# Patient Record
Sex: Female | Born: 1940
Health system: Southern US, Community
[De-identification: ages and names within clinical notes are randomized; demographics above are authoritative.]

## PROBLEM LIST (undated history)

## (undated) ENCOUNTER — Emergency Department (HOSPITAL_COMMUNITY): Admission: EM | Discharge status: Against Medical Advice | Payer: Medicare Other

## (undated) DIAGNOSIS — K219 Gastro-esophageal reflux disease without esophagitis: Secondary | ICD-10-CM

## (undated) DIAGNOSIS — C801 Malignant (primary) neoplasm, unspecified: Secondary | ICD-10-CM

## (undated) DIAGNOSIS — T7840XA Allergy, unspecified, initial encounter: Secondary | ICD-10-CM

## (undated) DIAGNOSIS — H269 Unspecified cataract: Secondary | ICD-10-CM

## (undated) DIAGNOSIS — I10 Essential (primary) hypertension: Secondary | ICD-10-CM

## (undated) DIAGNOSIS — E785 Hyperlipidemia, unspecified: Secondary | ICD-10-CM

## (undated) DIAGNOSIS — R059 Cough, unspecified: Secondary | ICD-10-CM

## (undated) DIAGNOSIS — E079 Disorder of thyroid, unspecified: Secondary | ICD-10-CM

## (undated) DIAGNOSIS — M719 Bursopathy, unspecified: Secondary | ICD-10-CM

## (undated) DIAGNOSIS — H409 Unspecified glaucoma: Secondary | ICD-10-CM

## (undated) DIAGNOSIS — Z86010 Personal history of colonic polyps: Secondary | ICD-10-CM

## (undated) DIAGNOSIS — M1990 Unspecified osteoarthritis, unspecified site: Secondary | ICD-10-CM

## (undated) DIAGNOSIS — G2581 Restless legs syndrome: Secondary | ICD-10-CM

## (undated) DIAGNOSIS — E7800 Pure hypercholesterolemia, unspecified: Secondary | ICD-10-CM

## (undated) HISTORY — PX: EYE SURGERY: SHX253

## (undated) HISTORY — DX: Unspecified osteoarthritis, unspecified site: M19.90

## (undated) HISTORY — PX: TUBAL LIGATION: SHX77

## (undated) HISTORY — DX: Malignant (primary) neoplasm, unspecified: C80.1

## (undated) HISTORY — PX: SKIN CANCER EXCISION: SHX779

## (undated) HISTORY — DX: Essential (primary) hypertension: I10

## (undated) HISTORY — DX: Unspecified glaucoma: H40.9

## (undated) HISTORY — PX: COLONOSCOPY: SHX174

## (undated) HISTORY — PX: TONSILLECTOMY AND ADENOIDECTOMY: SUR1326

## (undated) HISTORY — PX: CHOLECYSTECTOMY: SHX55

## (undated) HISTORY — PX: OTHER SURGICAL HISTORY: SHX169

## (undated) HISTORY — DX: Allergy, unspecified, initial encounter: T78.40XA

## (undated) HISTORY — DX: Personal history of colonic polyps: Z86.010

## (undated) HISTORY — DX: Gastro-esophageal reflux disease without esophagitis: K21.9

## (undated) HISTORY — DX: Hyperlipidemia, unspecified: E78.5

## (undated) HISTORY — DX: Unspecified cataract: H26.9

## (undated) HISTORY — DX: Disorder of thyroid, unspecified: E07.9

---

## 1997-12-31 ENCOUNTER — Other Ambulatory Visit: Admission: RE | Admit: 1997-12-31 | Discharge: 1997-12-31 | Payer: Self-pay

## 1999-08-06 ENCOUNTER — Other Ambulatory Visit: Admission: RE | Admit: 1999-08-06 | Discharge: 1999-08-06 | Payer: Self-pay | Admitting: Internal Medicine

## 1999-09-25 ENCOUNTER — Encounter: Payer: Self-pay | Admitting: Internal Medicine

## 1999-09-25 ENCOUNTER — Ambulatory Visit (HOSPITAL_COMMUNITY): Admission: RE | Admit: 1999-09-25 | Discharge: 1999-09-25 | Payer: Self-pay | Admitting: Internal Medicine

## 2000-12-16 ENCOUNTER — Ambulatory Visit (HOSPITAL_COMMUNITY): Admission: RE | Admit: 2000-12-16 | Discharge: 2000-12-16 | Payer: Self-pay | Admitting: Internal Medicine

## 2000-12-16 ENCOUNTER — Encounter: Payer: Self-pay | Admitting: Internal Medicine

## 2001-01-10 ENCOUNTER — Encounter (HOSPITAL_BASED_OUTPATIENT_CLINIC_OR_DEPARTMENT_OTHER): Payer: Self-pay | Admitting: General Surgery

## 2001-01-10 ENCOUNTER — Encounter: Admission: RE | Admit: 2001-01-10 | Discharge: 2001-01-10 | Payer: Self-pay | Admitting: General Surgery

## 2001-01-11 ENCOUNTER — Encounter (INDEPENDENT_AMBULATORY_CARE_PROVIDER_SITE_OTHER): Payer: Self-pay | Admitting: *Deleted

## 2001-01-11 ENCOUNTER — Ambulatory Visit (HOSPITAL_BASED_OUTPATIENT_CLINIC_OR_DEPARTMENT_OTHER): Admission: RE | Admit: 2001-01-11 | Discharge: 2001-01-11 | Payer: Self-pay | Admitting: General Surgery

## 2001-05-26 ENCOUNTER — Other Ambulatory Visit: Admission: RE | Admit: 2001-05-26 | Discharge: 2001-05-26 | Payer: Self-pay | Admitting: Internal Medicine

## 2002-10-13 ENCOUNTER — Other Ambulatory Visit: Admission: RE | Admit: 2002-10-13 | Discharge: 2002-10-13 | Payer: Self-pay | Admitting: Internal Medicine

## 2002-10-16 ENCOUNTER — Encounter: Admission: RE | Admit: 2002-10-16 | Discharge: 2002-10-16 | Payer: Self-pay | Admitting: Internal Medicine

## 2002-10-16 ENCOUNTER — Encounter: Payer: Self-pay | Admitting: Internal Medicine

## 2004-06-11 ENCOUNTER — Encounter: Payer: Self-pay | Admitting: Internal Medicine

## 2004-06-16 ENCOUNTER — Encounter: Admission: RE | Admit: 2004-06-16 | Discharge: 2004-06-16 | Payer: Self-pay | Admitting: Internal Medicine

## 2004-07-09 ENCOUNTER — Ambulatory Visit: Payer: Self-pay | Admitting: Pulmonary Disease

## 2004-08-06 ENCOUNTER — Ambulatory Visit: Payer: Self-pay | Admitting: Internal Medicine

## 2004-10-25 ENCOUNTER — Emergency Department (HOSPITAL_COMMUNITY): Admission: EM | Admit: 2004-10-25 | Discharge: 2004-10-25 | Payer: Self-pay | Admitting: Family Medicine

## 2004-10-25 ENCOUNTER — Ambulatory Visit (HOSPITAL_COMMUNITY): Admission: RE | Admit: 2004-10-25 | Discharge: 2004-10-25 | Payer: Self-pay | Admitting: Family Medicine

## 2004-11-06 ENCOUNTER — Ambulatory Visit: Payer: Self-pay | Admitting: Internal Medicine

## 2005-03-11 ENCOUNTER — Ambulatory Visit: Payer: Self-pay | Admitting: Internal Medicine

## 2005-04-14 ENCOUNTER — Ambulatory Visit: Payer: Self-pay | Admitting: Internal Medicine

## 2005-04-22 ENCOUNTER — Ambulatory Visit: Payer: Self-pay | Admitting: Internal Medicine

## 2005-05-21 ENCOUNTER — Ambulatory Visit: Payer: Self-pay | Admitting: Internal Medicine

## 2006-01-20 ENCOUNTER — Ambulatory Visit: Payer: Self-pay | Admitting: Internal Medicine

## 2006-03-02 ENCOUNTER — Ambulatory Visit: Payer: Self-pay | Admitting: Internal Medicine

## 2006-07-19 ENCOUNTER — Ambulatory Visit: Payer: Self-pay | Admitting: Internal Medicine

## 2007-03-01 LAB — CONVERTED CEMR LAB: Pap Smear: NORMAL

## 2007-03-29 DIAGNOSIS — I10 Essential (primary) hypertension: Secondary | ICD-10-CM | POA: Insufficient documentation

## 2007-03-29 DIAGNOSIS — F17200 Nicotine dependence, unspecified, uncomplicated: Secondary | ICD-10-CM | POA: Insufficient documentation

## 2007-03-29 DIAGNOSIS — E118 Type 2 diabetes mellitus with unspecified complications: Secondary | ICD-10-CM | POA: Insufficient documentation

## 2007-04-14 ENCOUNTER — Ambulatory Visit: Payer: Self-pay | Admitting: Internal Medicine

## 2007-04-14 DIAGNOSIS — E1169 Type 2 diabetes mellitus with other specified complication: Secondary | ICD-10-CM | POA: Insufficient documentation

## 2007-04-14 DIAGNOSIS — E785 Hyperlipidemia, unspecified: Secondary | ICD-10-CM

## 2007-04-14 LAB — CONVERTED CEMR LAB
Cholesterol, target level: 200 mg/dL
HDL goal, serum: 40 mg/dL
LDL Goal: 100 mg/dL

## 2007-04-15 LAB — CONVERTED CEMR LAB
ALT: 29 units/L (ref 0–35)
AST: 27 units/L (ref 0–37)
Albumin: 4.6 g/dL (ref 3.5–5.2)
Alkaline Phosphatase: 94 units/L (ref 39–117)
BUN: 14 mg/dL (ref 6–23)
Bilirubin, Direct: 0.1 mg/dL (ref 0.0–0.3)
CO2: 31 meq/L (ref 19–32)
Calcium: 9.8 mg/dL (ref 8.4–10.5)
Chloride: 104 meq/L (ref 96–112)
Cholesterol: 138 mg/dL (ref 0–200)
Creatinine, Ser: 0.5 mg/dL (ref 0.4–1.2)
GFR calc Af Amer: 160 mL/min
GFR calc non Af Amer: 132 mL/min
Glucose, Bld: 119 mg/dL — ABNORMAL HIGH (ref 70–99)
HDL: 45 mg/dL (ref >39.0)
Hgb A1c MFr Bld: 6.5 % — ABNORMAL HIGH (ref 4.6–6.0)
LDL Cholesterol: 63 mg/dL (ref 0–99)
Potassium: 4.5 meq/L (ref 3.5–5.1)
Sodium: 143 meq/L (ref 135–145)
Total Bilirubin: 1 mg/dL (ref 0.3–1.2)
Total CHOL/HDL Ratio: 3.1
Total Protein: 7.4 g/dL (ref 6.0–8.3)
Triglycerides: 152 mg/dL — ABNORMAL HIGH (ref 0–149)
VLDL: 30 mg/dL (ref 0–40)

## 2007-08-03 ENCOUNTER — Ambulatory Visit (HOSPITAL_COMMUNITY): Admission: RE | Admit: 2007-08-03 | Discharge: 2007-08-03 | Payer: Self-pay | Admitting: Internal Medicine

## 2007-09-19 ENCOUNTER — Telehealth: Payer: Self-pay | Admitting: Internal Medicine

## 2008-05-14 ENCOUNTER — Ambulatory Visit: Payer: Self-pay | Admitting: Internal Medicine

## 2008-05-14 DIAGNOSIS — I8390 Asymptomatic varicose veins of unspecified lower extremity: Secondary | ICD-10-CM | POA: Insufficient documentation

## 2008-05-15 LAB — CONVERTED CEMR LAB
ALT: 23 units/L (ref 0–35)
Basophils Absolute: 0.1 10*3/uL (ref 0.0–0.1)
Basophils Relative: 1.3 % (ref 0.0–3.0)
Bilirubin, Direct: 0.1 mg/dL (ref 0.0–0.3)
Calcium: 9.2 mg/dL (ref 8.4–10.5)
Chloride: 112 meq/L (ref 96–112)
Cholesterol: 102 mg/dL (ref 0–200)
Creatinine, Ser: 0.5 mg/dL (ref 0.4–1.2)
Eosinophils Relative: 3 % (ref 0.0–5.0)
GFR calc Af Amer: 159 mL/min
Glucose, Bld: 115 mg/dL — ABNORMAL HIGH (ref 70–99)
HDL: 32 mg/dL — ABNORMAL LOW (ref >39.0)
Lymphocytes Relative: 31.1 % (ref 12.0–46.0)
MCHC: 34.9 g/dL (ref 30.0–36.0)
Monocytes Relative: 5.5 % (ref 3.0–12.0)
Neutro Abs: 4.3 10*3/uL (ref 1.4–7.7)
Neutrophils Relative %: 59.1 % (ref 43.0–77.0)
Platelets: 215 10*3/uL (ref 150–400)
RBC: 4.36 M/uL (ref 3.87–5.11)
RDW: 12 % (ref 11.5–14.6)
Total Protein: 6.7 g/dL (ref 6.0–8.3)
VLDL: 24 mg/dL (ref 0–40)

## 2008-05-29 ENCOUNTER — Ambulatory Visit: Payer: Self-pay | Admitting: Internal Medicine

## 2008-06-01 ENCOUNTER — Ambulatory Visit: Payer: Self-pay | Admitting: Internal Medicine

## 2008-06-05 ENCOUNTER — Encounter: Admission: RE | Admit: 2008-06-05 | Discharge: 2008-06-05 | Payer: Self-pay | Admitting: Internal Medicine

## 2008-06-05 ENCOUNTER — Encounter: Payer: Self-pay | Admitting: Internal Medicine

## 2008-06-12 ENCOUNTER — Ambulatory Visit: Payer: Self-pay | Admitting: Internal Medicine

## 2008-06-12 ENCOUNTER — Encounter: Payer: Self-pay | Admitting: Internal Medicine

## 2008-06-12 DIAGNOSIS — Z860100 Personal history of colon polyps, unspecified: Secondary | ICD-10-CM

## 2008-06-12 HISTORY — DX: Personal history of colonic polyps: Z86.010

## 2008-06-12 HISTORY — DX: Personal history of colon polyps, unspecified: Z86.0100

## 2008-06-12 LAB — HM COLONOSCOPY

## 2008-06-14 ENCOUNTER — Ambulatory Visit: Payer: Self-pay | Admitting: Internal Medicine

## 2008-06-18 ENCOUNTER — Telehealth: Payer: Self-pay | Admitting: Internal Medicine

## 2008-06-20 ENCOUNTER — Encounter: Payer: Self-pay | Admitting: Internal Medicine

## 2008-10-09 ENCOUNTER — Ambulatory Visit: Payer: Self-pay | Admitting: Internal Medicine

## 2008-10-16 ENCOUNTER — Encounter: Admission: RE | Admit: 2008-10-16 | Discharge: 2008-10-16 | Payer: Self-pay | Admitting: Interventional Radiology

## 2008-12-04 ENCOUNTER — Encounter: Admission: RE | Admit: 2008-12-04 | Discharge: 2008-12-04 | Payer: Self-pay | Admitting: Interventional Radiology

## 2008-12-11 ENCOUNTER — Encounter: Admission: RE | Admit: 2008-12-11 | Discharge: 2008-12-11 | Payer: Self-pay | Admitting: Interventional Radiology

## 2009-01-01 ENCOUNTER — Encounter: Admission: RE | Admit: 2009-01-01 | Discharge: 2009-01-01 | Payer: Self-pay | Admitting: Interventional Radiology

## 2009-01-18 ENCOUNTER — Telehealth: Payer: Self-pay | Admitting: Internal Medicine

## 2009-01-18 ENCOUNTER — Ambulatory Visit: Payer: Self-pay | Admitting: Internal Medicine

## 2009-01-18 LAB — CONVERTED CEMR LAB
Bilirubin Urine: NEGATIVE
Glucose, Urine, Semiquant: NEGATIVE
Ketones, urine, test strip: NEGATIVE
Nitrite: NEGATIVE

## 2009-01-19 ENCOUNTER — Encounter: Payer: Self-pay | Admitting: Internal Medicine

## 2009-06-05 ENCOUNTER — Ambulatory Visit: Payer: Self-pay | Admitting: Internal Medicine

## 2009-06-05 LAB — CONVERTED CEMR LAB
ALT: 27 units/L (ref 0–35)
AST: 26 units/L (ref 0–37)
Albumin: 4.3 g/dL (ref 3.5–5.2)
Alkaline Phosphatase: 68 units/L (ref 39–117)
Basophils Absolute: 0 10*3/uL (ref 0.0–0.1)
Basophils Relative: 0.1 % (ref 0.0–3.0)
CO2: 23 meq/L (ref 19–32)
Calcium: 9.3 mg/dL (ref 8.4–10.5)
Chloride: 103 meq/L (ref 96–112)
Cholesterol: 112 mg/dL (ref 0–200)
Creatinine, Ser: 0.5 mg/dL (ref 0.4–1.2)
Eosinophils Relative: 2 % (ref 0.0–5.0)
GFR calc non Af Amer: 131.16 mL/min (ref >60)
Glucose, Bld: 114 mg/dL — ABNORMAL HIGH (ref 70–99)
HCT: 45.5 % (ref 36.0–46.0)
LDL Cholesterol: 46 mg/dL (ref 0–99)
MCHC: 34 g/dL (ref 30.0–36.0)
RBC: 4.6 M/uL (ref 3.87–5.11)
RDW: 12.4 % (ref 11.5–14.6)
TSH: 1.22 microintl units/mL (ref 0.35–5.50)
Total Bilirubin: 0.7 mg/dL (ref 0.3–1.2)
Total Protein: 7 g/dL (ref 6.0–8.3)
VLDL: 27 mg/dL (ref 0.0–40.0)

## 2009-06-07 ENCOUNTER — Ambulatory Visit: Payer: Self-pay | Admitting: Internal Medicine

## 2009-06-08 ENCOUNTER — Encounter: Payer: Self-pay | Admitting: Internal Medicine

## 2009-06-10 ENCOUNTER — Telehealth: Payer: Self-pay | Admitting: Internal Medicine

## 2009-06-25 ENCOUNTER — Encounter: Admission: RE | Admit: 2009-06-25 | Discharge: 2009-06-25 | Payer: Self-pay | Admitting: Interventional Radiology

## 2010-04-04 ENCOUNTER — Ambulatory Visit: Payer: Self-pay | Admitting: Family Medicine

## 2010-05-31 LAB — CONVERTED CEMR LAB: Pap Smear: NORMAL

## 2010-06-06 ENCOUNTER — Ambulatory Visit: Payer: Self-pay | Admitting: Internal Medicine

## 2010-06-09 LAB — CONVERTED CEMR LAB
ALT: 28 units/L (ref 0–35)
Albumin: 4.2 g/dL (ref 3.5–5.2)
BUN: 14 mg/dL (ref 6–23)
CO2: 31 meq/L (ref 19–32)
Creatinine, Ser: 0.5 mg/dL (ref 0.4–1.2)
GFR calc non Af Amer: 143.96 mL/min (ref >60)
Glucose, Bld: 125 mg/dL — ABNORMAL HIGH (ref 70–99)
TSH: 0.89 microintl units/mL (ref 0.35–5.50)
Total CHOL/HDL Ratio: 3
Total Protein: 6.7 g/dL (ref 6.0–8.3)
Triglycerides: 109 mg/dL (ref 0.0–149.0)

## 2010-06-11 ENCOUNTER — Encounter: Payer: Self-pay | Admitting: Family Medicine

## 2010-09-30 NOTE — Miscellaneous (Signed)
Summary: BMI   Clinical Lists Changes  Observations: Added new observation of BMI: 32.49  (06/11/2010 13:50) Added new observation of TEMPERATURE: 98 deg F (06/11/2010 13:50) Added new observation of WEIGHT: 177 lb (06/11/2010 13:50)      Vital Signs:  Patient Profile:   70 Years Old Female Height:     62 inches (156.21 cm) Weight:      177 pounds BMI:     32.49 Temp:     98 degrees F

## 2010-09-30 NOTE — Miscellaneous (Signed)
Summary: Waiver of Liability for [employer]  Waiver of Liability for [employer]   Imported By: Maryln Gottron 06/11/2010 11:00:49  _____________________________________________________________________  External Attachment:    Type:   Image     Comment:   External Document

## 2010-09-30 NOTE — Assessment & Plan Note (Signed)
Summary: emp---will fast//ccm   Vital Signs:  Patient profile:   70 year old female Menstrual status:  postmenopausal Height:      62 inches Weight:      177 pounds Temp:     98.0 degrees F oral Pulse rate:   72 / minute Pulse rhythm:   regular Resp:     12 per minute BP sitting:   168 / 84  (left arm) Cuff size:   regular  Vitals Entered By: Sid Falcon LPN (June 06, 2010 9:06 AM)  History of Present Illness: Here for Medicare AWV:  1.   Risk factors based on Past M, S, F history:---see PMH 2.   Physical Activities: --able to do all, no physical exercise 3.   Depression/mood: ---doing well, no concerns 4.   Hearing: -no concerns 5.   ADL's: ---able to do all 6.   Fall Risk: --no concerns 7.   Home Safety: --no concerns 8.   Height, weight, &visual acuity:see exam, yearly optometry exam 9.   Counseling: ---diet, exercise and smoking cessation 10.   Labs ordered based on risk factors: -see orders 11.           Referral Coordination---none necessary 12.           Care Plan---stop smoking 13.            Cognitive Assessment---no motor or sensory or cognitive deficits identified   Current Problems:  VARICOSE VEINS, LOWER EXTREMITIES (ICD-454.9)---no pain HYPERLIPIDEMIA NEC/NOS (ICD-272.4)---doing well on meds CIGARETTE SMOKER (ICD-305.1)---asvised cessation HYPERTENSION (ICD-401.9)---tolerating meds DIABETES MELLITUS, TYPE II (ICD-250.00)---tolerating meds, needs f/u   All other systems reviewed and were negative    Current Problems (verified): 1)  Varicose Veins, Lower Extremities  (ICD-454.9) 2)  Preventive Health Care  (ICD-V70.0) 3)  Hyperlipidemia Nec/nos  (ICD-272.4) 4)  Cigarette Smoker  (ICD-305.1) 5)  Hypertension  (ICD-401.9) 6)  Diabetes Mellitus, Type II  (ICD-250.00)  Current Medications (verified): 1)  Metformin Hcl 500 Mg Tabs (Metformin Hcl) .... Take 1 Tablet By Mouth Once A Day 2)  Fosinopril Sodium 40 Mg Tabs (Fosinopril Sodium) .... Take 1  Tablet By Mouth Once A Day 3)  Prevacid 30 Mg  Cpdr (Lansoprazole) .... Take 1 Tablet By Mouth Once A Day 4)  Aspirin 81 Mg  Tbec (Aspirin) .... Take 1 Tablet By Mouth Once A Day 5)  Multivitamins   Caps (Multiple Vitamin) .... Once Daily 6)  Calcium 1200 1200-1000 Mg-Unit Chew (Calcium Carbonate-Vit D-Min) .... Once Daily 7)  Crestor 10 Mg  Tabs (Rosuvastatin Calcium) .... Take 1/2 Tablet Every Other Day  Allergies (verified): No Known Drug Allergies  Past History:  Past Medical History: Last updated: 06/05/2009 Diabetes mellitus, type II Hypertension smoker "eye hemorrhage---not DM related"  Past Surgical History: Last updated: 03/29/2007 Cholecystectomy Tubal ligation, bilateral bladder tuck  Family History: Last updated: 04/14/2007 Family History Diabetes 1st degree relative Family History Hypertension father deceased unknown cause mother with renal failure, dm, heart issues  Social History: Last updated: 04/14/2007 Married Current Smoker Regular exercise-no  Risk Factors: Exercise: no (04/14/2007)  Risk Factors: Smoking Status: current (04/14/2007) Packs/Day: 1 (03/29/2007)  Diabetes Management Exam:    Eye Exam:       Eye Exam done elsewhere          Date: 05/31/2010          Results: normal--no dm retinopathy          Done by: Jolene Schimke   Impression & Recommendations:  Problem # 1:  PREVENTIVE HEALTH CARE (ICD-V70.0)  health maint UTD advised smoking cessation  Orders: Medicare -1st Annual Wellness Visit [phone])  Problem # 2:  VARICOSE VEINS, LOWER EXTREMITIES (ICD-454.9) compression stockings  Problem # 3:  HYPERLIPIDEMIA NEC/NOS (ICD-272.4)  continue current medications  check labs Her updated medication list for this problem includes:    Crestor 10 Mg Tabs (Rosuvastatin calcium) .Marland Kitchen... Take 1/2 tablet every other day  Labs Reviewed: SGOT: 26 (06/05/2009)   SGPT: 27 (06/05/2009)  Lipid Goals: Chol Goal: 200 (04/14/2007)   HDL Goal: 40  (04/14/2007)   LDL Goal: 100 (04/14/2007)   TG Goal: 150 (04/14/2007)  Prior 10 Yr Risk Heart Disease: Not enough information (04/14/2007)   HDL:38.80 (06/05/2009), 32.0 (05/14/2008)  LDL:46 (06/05/2009), 46 (05/14/2008)  Chol:112 (06/05/2009), 102 (05/14/2008)  Trig:135.0 (06/05/2009), 118 (05/14/2008)  Orders: TLB-Lipid Panel (80061-LIPID) TLB-Hepatic/Liver Function Pnl (80076-HEPATIC) TLB-TSH (Thyroid Stimulating Hormone) (84443-TSH)  Problem # 4:  CIGARETTE SMOKER (ICD-305.1) stop smoking  Problem # 5:  HYPERTENSION (ICD-401.9)  Her updated medication list for this problem includes:    Fosinopril Sodium 40 Mg Tabs (Fosinopril sodium) .Marland Kitchen... Take 1 tablet by mouth once a day  BP today: 168/84 Prior BP: 142/62 (04/04/2010)  Prior 10 Yr Risk Heart Disease: Not enough information (04/14/2007)  Labs Reviewed: K+: 3.9 (06/05/2009) Creat: : 0.5 (06/05/2009)   Chol: 112 (06/05/2009)   HDL: 38.80 (06/05/2009)   LDL: 46 (06/05/2009)   TG: 135.0 (06/05/2009)  Orders: Venipuncture (47829) TLB-BMP (Basic Metabolic Panel-BMET) (80048-METABOL)  Problem # 6:  DIABETES MELLITUS, TYPE II (ICD-250.00)  Her updated medication list for this problem includes:    Metformin Hcl 500 Mg Tabs (Metformin hcl) .Marland Kitchen... Take 1 tablet by mouth once a day    Fosinopril Sodium 40 Mg Tabs (Fosinopril sodium) .Marland Kitchen... Take 1 tablet by mouth once a day    Aspirin 81 Mg Tbec (Aspirin) .Marland Kitchen... Take 1 tablet by mouth once a day  Labs Reviewed: Creat: 0.5 (06/05/2009)     Last Eye Exam: normal--no dm retinopathy (05/31/2010) Reviewed HgBA1c results: 6.6 (06/05/2009)  6.7 (05/14/2008)  Orders: TLB-A1C / Hgb A1C (Glycohemoglobin) (83036-A1C)  Complete Medication List: 1)  Metformin Hcl 500 Mg Tabs (Metformin hcl) .... Take 1 tablet by mouth once a day 2)  Fosinopril Sodium 40 Mg Tabs (Fosinopril sodium) .... Take 1 tablet by mouth once a day 3)  Prevacid 30 Mg Cpdr (Lansoprazole) .... Take 1 tablet by mouth once  a day 4)  Aspirin 81 Mg Tbec (Aspirin) .... Take 1 tablet by mouth once a day 5)  Multivitamins Caps (Multiple vitamin) .... Once daily 6)  Calcium 1200 1200-1000 Mg-unit Chew (Calcium carbonate-vit d-min) .... Once daily 7)  Crestor 10 Mg Tabs (Rosuvastatin calcium) .... Take 1/2 tablet every other day  Other Orders: Flu Vaccine 41yrs + MEDICARE PATIENTS [phone]) Administration Flu vaccine - MCR (Y8657)  Preventive Care Screening  Mammogram:    Date:  05/31/2010    Next Due:  05/2012    Results:  normal   Pap Smear:    Date:  05/31/2010    Next Due:  05/2013    Results:  normal   Last Flu Shot:    Date:  06/06/2010    Results:  Fluvax 3+    Flu Vaccine Consent Questions     Do you have a history of severe allergic reactions to this vaccine? no    Any prior history of allergic reactions to egg and/or gelatin? no  Do you have a sensitivity to the preservative Thimersol? no    Do you have a past history of Guillan-Barre Syndrome? no    Do you currently have an acute febrile illness? no    Have you ever had a severe reaction to latex? no    Vaccine information given and explained to patient? yes    Are you currently pregnant? no    Lot Number:[id number]   Exp Date:02/28/2011   Site Given  Left Deltoid IMlu Physical Exam General Appearance: well developed, well nourished, no acute distress Eyes: conjunctiva-erythema bilaterally and lids normal, PERRL, EOMI,  Ears, Nose, Mouth, Throat: TM clear, nares clear, oral exam WNL Neck: supple, no lymphadenopathy, no thyromegaly, no JVD Respiratory: clear to auscultation and percussion, respiratory effort normal Cardiovascular: regular rate and rhythm, S1-S2, no murmur, rub or gallop, no bruits, peripheral pulses normal and symmetric, no cyanosis, clubbing, edema or varicosities Chest: no scars, masses, tenderness; no asymmetry, skin changes,  Gastrointestinal: soft, non-tender; no hepatosplenomegaly, masses; active bowel sounds all  quadrants Lymphatic: no cervical, axillary or inguinal adenopathy Musculoskeletal: gait normal, muscle tone and strength WNL, no joint swelling, effusions, discoloration, crepitus  Skin: clear, good turgor, color WNL, no rashes, lesions, or ulcerations Neurologic: normal mental status, normal reflexes, normal strength, sensation, and motion Psychiatric: alert; oriented to person, place and time Other Exam:   Appended Document: Orders Update     Clinical Lists Changes  Orders: Added new Service order of Specimen Handling (04540) - Signed      Appended Document: emp---will fast//ccm     Clinical Lists Changes  Orders: Added new Service order of Zoster (Shingles) Vaccine Live [id number]) - Signed Added new Service order of Admin 1st Vaccine (14782) - Signed Observations: Added new observation of ZOSTAVAX LOT: [id number] (06/06/2010 12:09) Added new observation of ZOSTAVAX EXP: 04/18/2011 (06/06/2010 12:09) Added new observation of ZOSTAVAX BY: Sid Falcon LPN (08/65/7846 12:09) Added new observation of ZOSTAVAX RTE: IM (06/06/2010 12:09) Added new observation of ZOSTAVAXDOSE: 0.65 ml (06/06/2010 12:09) Added new observation of ZOSTAVAX MFR: Merck (06/06/2010 12:09) Added new observation of ZOSTAVAXSITE: right deltoid (06/06/2010 12:09) Added new observation of ZOSTAVAX: Zostavax (06/06/2010 12:09)       Immunizations Administered:  Zostavax # 1:    Vaccine Type: Zostavax    Site: right deltoid    Mfr: Merck    Dose: 0.65 ml    Route: IM    Given by: Sid Falcon LPN    Exp. Date: 04/18/2011    Lot #: [id number]

## 2010-09-30 NOTE — Assessment & Plan Note (Signed)
Summary: bee sting/dm   Vital Signs:  Patient profile:   70 year old female Menstrual status:  postmenopausal Height:      61.5 inches (156.21 cm) Weight:      177 pounds (80.45 kg) BMI:     33.02 O2 Sat:      96 % on Room air Temp:     98.6 degrees F (37.00 degrees C) oral Pulse rate:   94 / minute BP sitting:   142 / 62  (left arm) Cuff size:   regular  Vitals Entered By: Josph Macho RMA (April 04, 2010 9:05 AM)  O2 Flow:  Room air CC: Bee sting on left ankle/ pts daughter and spouse concerned because pt is diabetic/ CF Is Patient Diabetic? Yes   History of Present Illness: Patient in today for evaluation of a bee sting and a mosquito bite just above medial left ankle a week ago about a week ago and her family was worried that the rash has not resolved so she is here to have it evaluated. She notes some mild pruritus but denies the lesion is spreading. No fevers/chills/myalgias/fatigue/fluctuance/warmth/drainage is noted. She is a diabetic but her blood sugar readings have not spiked this week. No CP/palp/anorexia  Current Medications (verified): 1)  Metformin Hcl 500 Mg Tabs (Metformin Hcl) .... Take 1 Tablet By Mouth Once A Day 2)  Fosinopril Sodium 40 Mg Tabs (Fosinopril Sodium) .... Take 1 Tablet By Mouth Once A Day 3)  Prevacid 30 Mg  Cpdr (Lansoprazole) .... Take 1 Tablet By Mouth Once A Day 4)  Aspirin 81 Mg  Tbec (Aspirin) .... Take 1 Tablet By Mouth Once A Day 5)  Multivitamins   Caps (Multiple Vitamin) .... Once Daily 6)  Calcium 1200 1200-1000 Mg-Unit Chew (Calcium Carbonate-Vit D-Min) .... Once Daily 7)  Crestor 10 Mg  Tabs (Rosuvastatin Calcium) .... Take 1/2 Tablet Every Other Day  Allergies (verified): No Known Drug Allergies  Past History:  Past medical history reviewed for relevance to current acute and chronic problems. Social history (including risk factors) reviewed for relevance to current acute and chronic problems.  Past Medical  History: Reviewed history from 06/05/2009 and no changes required. Diabetes mellitus, type II Hypertension smoker "eye hemorrhage---not DM related"  Social History: Reviewed history from 04/14/2007 and no changes required. Married Current Smoker Regular exercise-no  Review of Systems      See HPI  Physical Exam  General:  Well-developed,well-nourished,in no acute distress; alert,appropriate and cooperative throughout examination Mouth:  Oral mucosa and oropharynx without lesions or exudates.  Neck:  No deformities, masses, or tenderness noted. Lungs:  Normal respiratory effort, chest expands symmetrically. Lungs are clear to auscultation, no crackles or wheezes. Heart:  Normal rate and regular rhythm. S1 and S2 normal without gallop, murmur, click, rub or other extra sounds. Abdomen:  Bowel sounds positive,abdomen soft and non-tender without masses, organomegaly or hernias noted. Msk:  No deformity or scoliosis noted of thoracic or lumbar spine.   Extremities:  No clubbing, cyanosis, edema, or deformity noted  Neurologic:  No cranial nerve deficits noted. Station and gait are normal. Plantar reflexes are down-going bilaterally. DTRs are symmetrical throughout. Sensory, motor and coordinative functions appear intact. Skin:  Intact without suspicious lesions. She has an oval shaped patch of mild erythema above medial malleolus roughly 2 x 4 cm not raised or fluctuant. small scab in middle, not tender to palp or hot Cervical Nodes:  No lymphadenopathy noted   Impression & Recommendations:  Problem #  1:  CONTACT DERMATITIS (ICD-692.9) looks more like a reactive dermititis than a cellulitis, would recommend cleanse daily with warm water and soap, witch hazel astringent apply Bacitracin ointment two times a day to prevent cellulitis and if worsens given course Keflex to use  Problem # 2:  HYPERTENSION (ICD-401.9)  Her updated medication list for this problem includes:    Fosinopril  Sodium 40 Mg Tabs (Fosinopril sodium) .Marland Kitchen... Take 1 tablet by mouth once a day Mild elevation no concerns  Complete Medication List: 1)  Metformin Hcl 500 Mg Tabs (Metformin hcl) .... Take 1 tablet by mouth once a day 2)  Fosinopril Sodium 40 Mg Tabs (Fosinopril sodium) .... Take 1 tablet by mouth once a day 3)  Prevacid 30 Mg Cpdr (Lansoprazole) .... Take 1 tablet by mouth once a day 4)  Aspirin 81 Mg Tbec (Aspirin) .... Take 1 tablet by mouth once a day 5)  Multivitamins Caps (Multiple vitamin) .... Once daily 6)  Calcium 1200 1200-1000 Mg-unit Chew (Calcium carbonate-vit d-min) .... Once daily 7)  Crestor 10 Mg Tabs (Rosuvastatin calcium) .... Take 1/2 tablet every other day 8)  Baciguent 500 Unit/gm Oint (Bacitracin) .... Apply to lesion on left leg two times a day x 7 days 9)  Keflex 500 Mg Caps (Cephalexin) .Marland Kitchen.. 1 cap by mouth 4 x daily x 7 days only fill if rash worsens  Patient Instructions: 1)  Please schedule a follow-up appointment as needed if symptoms worsen or do not improve 2)  Try Witch Hazel Astringent to clean area two times a day and as needed for any ainsect bite. Apply Bacitracin ointment  two times a day x 7 days  and then as needed. Only start antibiotic pills if redness/warmth of fevers occur or rash begins to spread Prescriptions: BACIGUENT 500 UNIT/GM OINT (BACITRACIN) apply to lesion on left leg two times a day x 7 days  #1 tube x 0   Entered and Authorized by:   Danise Edge MD   Signed by:   Danise Edge MD on 04/04/2010   Method used:   Electronically to        [facility]* (retail)       [address] [place], [place]  [address]       Ph: [phone]       Fax: [phone]   RxID:   [id number]

## 2010-10-09 ENCOUNTER — Telehealth: Payer: Self-pay | Admitting: *Deleted

## 2010-10-09 NOTE — Telephone Encounter (Signed)
mucinex dm bid for 7 days 

## 2010-10-09 NOTE — Telephone Encounter (Signed)
Pt. Is complaining of a URI, and sinus congestion.  Feels like she is talking in a barrel???   Wants RX

## 2010-10-09 NOTE — Telephone Encounter (Signed)
Notified pt of Dr. Sword's recommendations. 

## 2010-11-13 ENCOUNTER — Encounter: Payer: Self-pay | Admitting: Family Medicine

## 2010-11-13 ENCOUNTER — Ambulatory Visit (INDEPENDENT_AMBULATORY_CARE_PROVIDER_SITE_OTHER): Payer: Medicare Other | Admitting: Family Medicine

## 2010-11-13 VITALS — BP 140/72 | Temp 99.0°F | Ht 61.5 in | Wt 178.0 lb

## 2010-11-13 DIAGNOSIS — H6590 Unspecified nonsuppurative otitis media, unspecified ear: Secondary | ICD-10-CM

## 2010-11-13 NOTE — Progress Notes (Signed)
  Subjective:    Patient ID: Tracy Hammond, female    DOB: 03-27-1941, 70 y.o.   MRN: [id number]  HPI  patient presents with bilateral ear fullness left greater than right. Recent cold-like symptoms about a week ago which have cleared somewhat. She still has some sneezing and clear nasal drainage in the mornings. No vertigo. No ear pain. Denies fever or chills.   Review of Systems  Constitutional: Negative for fever.  HENT: Negative for tinnitus and ear discharge.   Respiratory: Negative for cough.        Objective:   Physical Exam  left serous effusion. Right eardrum appears normal. No significant cerumen.  Neck supple no adenopathy Chest clear to auscultation Heart regular rhythm and rate       Assessment & Plan:   left otitis media with effusion following viral URI. Try plain Allegra one daily for possible allergy symptoms. Explained this may take several weeks to resolve.

## 2010-11-13 NOTE — Patient Instructions (Signed)
Otitis Media With Effusion Otitis media with effusion is the presence of fluid in the middle ear. This is a common problem that often follows ear infections. It may be present for weeks or longer after the infection. Unlike an acute ear infection, otits media with effusion refers only to fluid behind the ear drum and not infection. Children with repeated ear and sinus infections and allergy problems are the most likely to get otitis media with effusion. CAUSES The most frequent cause of the fluid buildup is dysfunction of the eustacian tubes. These are the tubes that drain fluid in the ears to the throat. SYMPTOMS  The main symptom of this condition is hearing loss. As a result, you or your child may:   Listen to the TV at a loud volume.   Not respond to questions.   Ask "what" often when spoken to.   There may be a sensation of fullness or pressure but usually not pain.  DIAGNOSIS  Your caregiver will diagnose this condition by examining you or your child's ears.   Your caregiver may test the pressure in you or your child's ear with a tympanometer.   A hearing test may be conducted if the problem persists.   A caregiver will want to re-evaluate the condition periodically to see if it improves.  TREATMENT  Treatment depends on the duration and the effects of the effusion.   Antibiotics, decongestants, nose drops, and cortisone-type drugs may not be helpful.   Children with persistent ear effusions may have delayed language. Children at risk for developmental delays in hearing, learning, and speech may require referral to a specialist earlier than children not at risk.   You or your child's caregiver may suggest a referral to an Ear, Nose, and Throat (ENT) surgeon for treatment. The following may help restore normal hearing:   Drainage of fluid.   Placement of ear tubes (tympanostomy tubes).   Removal of adenoids (adenoidectomy).  HOME CARE INSTRUCTIONS  Avoid second hand  smoke.   Infants who are breast fed are less likely to have this condition.   Avoid feeding infants while laying flat.   Avoid known environmental allergens.   Be sure to see a caregiver or an ENT specialist for follow up.   Avoid people who are sick.  SEEK MEDICAL CARE IF:  Hearing is not better in 3 months.   Hearing is worse.   Ear pain.   Drainage from the ear.   Dizziness.  Document Released: 09/24/2004 Document Re-Released: 02/04/2010 [facility] Patient Information 2011 [place], [place].  Try over the counter Allegra for allergy symptoms.

## 2011-01-16 NOTE — Op Note (Signed)
[place]  Patient:    Tracy Hammond, Tracy Hammond                       MRN: [id number] Proc. Date: 01/11/01 Adm. Date:  [id number] Attending:  Fortino Sic                           Operative Report  PREOPERATIVE DIAGNOSIS:  Right inguinal hernia.  POSTOPERATIVE DIAGNOSIS:  Right inguinal hernia.  PROCEDURE:  Repair of right inguinal hernia with Marlex patch.  SURGEON:  Marnee Spring. Wiliam Ke, M.D.  ANESTHESIA:  Local MAC.  DESCRIPTION OF PROCEDURE:  With the patient well-sedated, the skin of the groin was prepped and draped in the usual manner.  Tissue was infiltrated with a mixture of Xylocaine and Marcaine anesthesia.  A transverse curvilinear incision was made in a lower abdominal skin fold, then deepened to subcutaneous tissue.  Examination of the femoral area reveals no hernia.  The external oblique aponeurosis was opened, _____ the inguinal canal.  Tissues were extremely fatty, and the floor was essentially gone.  The round ligament was clamped at the internal ring, and at the external ring it was doubly cut, then tied off with 2-0 silk ties in both locations.  There was no indirect sac seen.  The patient had essentially no inguinal floor.  This was repaired with a Marlex patch.  It was fashioned and sewn in place with two running 2-0 Novofil sutures, both starting at pubic tubercle.  One was run up the conjoined tendon and tied at the internal ring.  The other was run up the shelving portion of Pouparts ligament and tied at the internal ring. Hemostasis was excellent.  The external oblique aponeurosis was closed superficial to the cord with 2-0 Vicryl, 3-0 Vicryl was used for Scarpas fascia, skin was closed with subcuticular 4-0 Dexon, and Steri-Strips were applied.  Estimated blood loss minimal.   The patient received no blood and left the operating room in stable condition after sponge and needle counts were verified. DD:  01/11/01 TD:   01/11/01 Job: [id number]

## 2011-01-30 ENCOUNTER — Other Ambulatory Visit: Payer: Self-pay | Admitting: Obstetrics and Gynecology

## 2011-03-06 ENCOUNTER — Other Ambulatory Visit: Payer: Self-pay | Admitting: Internal Medicine

## 2011-03-23 ENCOUNTER — Telehealth: Payer: Self-pay

## 2011-03-23 NOTE — Telephone Encounter (Signed)
Appointment scheduled.

## 2011-03-24 ENCOUNTER — Ambulatory Visit (INDEPENDENT_AMBULATORY_CARE_PROVIDER_SITE_OTHER): Payer: Medicare Other | Admitting: Family Medicine

## 2011-03-24 ENCOUNTER — Encounter: Payer: Self-pay | Admitting: Family Medicine

## 2011-03-24 VITALS — BP 130/70 | Temp 98.7°F | Wt 178.0 lb

## 2011-03-24 DIAGNOSIS — J45909 Unspecified asthma, uncomplicated: Secondary | ICD-10-CM

## 2011-03-24 MED ORDER — HYDROCODONE-HOMATROPINE 5-1.5 MG/5ML PO SYRP: 5.0000 mL | ORAL_SOLUTION | Freq: Four times a day (QID) | ORAL | Status: AC | PRN

## 2011-03-24 MED ORDER — METHYLPREDNISOLONE ACETATE 80 MG/ML IJ SUSP
80.0000 mg | Freq: Once | INTRAMUSCULAR | Status: AC
  Administered 2011-03-24: 80 mg via INTRAMUSCULAR

## 2011-03-24 MED ORDER — AZITHROMYCIN 250 MG PO TABS: ORAL_TABLET | ORAL | Status: AC

## 2011-03-24 NOTE — Progress Notes (Signed)
  Subjective:    Patient ID: Tracy Hammond, female    DOB: 1941-08-01, 70 y.o.   MRN: [id number]  HPI Patient is smoker seen as work- in 1 week history of cough productive of mostly clear sputum. Some slight dyspnea with activity. No fever. Mucinex DM without improvement.  Cough worse at night. No hemoptysis.  No significant nasal congestion.   Review of Systems  Constitutional: Positive for fatigue. Negative for fever and chills.  HENT: Negative for sore throat, voice change and postnasal drip.   Respiratory: Positive for cough, shortness of breath and wheezing.   Cardiovascular: Negative for chest pain and leg swelling.       Objective:   Physical Exam  Constitutional: She is oriented to person, place, and time. She appears well-developed and well-nourished.  HENT:  Right Ear: External ear normal.  Left Ear: External ear normal.  Mouth/Throat: Oropharynx is clear and moist.  Neck: Neck supple.  Cardiovascular: Normal rate and regular rhythm.   Pulmonary/Chest:       No retractions. Normal respiratory rate. She has some diffuse wheezes. No rales.  Musculoskeletal: She exhibits no edema.  Neurological: She is alert and oriented to person, place, and time.          Assessment & Plan:  Asthmatic bronchitis. Start Zithromax. Hycodan for cough suppression at night. Depo-Medrol 80 mg given IM and watch blood sugars closely

## 2011-06-02 LAB — GLUCOSE, CAPILLARY: Glucose-Capillary: 169 — ABNORMAL HIGH

## 2011-06-03 ENCOUNTER — Other Ambulatory Visit: Payer: Self-pay | Admitting: Internal Medicine

## 2011-06-19 ENCOUNTER — Telehealth: Payer: Self-pay | Admitting: Internal Medicine

## 2011-06-19 ENCOUNTER — Encounter: Payer: Self-pay | Admitting: Internal Medicine

## 2011-06-19 ENCOUNTER — Ambulatory Visit (INDEPENDENT_AMBULATORY_CARE_PROVIDER_SITE_OTHER): Payer: Medicare Other | Admitting: Internal Medicine

## 2011-06-19 VITALS — BP 142/70 | HR 76 | Temp 98.3°F | Ht 61.5 in | Wt 176.0 lb

## 2011-06-19 DIAGNOSIS — I10 Essential (primary) hypertension: Secondary | ICD-10-CM

## 2011-06-19 DIAGNOSIS — E785 Hyperlipidemia, unspecified: Secondary | ICD-10-CM

## 2011-06-19 DIAGNOSIS — E119 Type 2 diabetes mellitus without complications: Secondary | ICD-10-CM

## 2011-06-19 DIAGNOSIS — Z23 Encounter for immunization: Secondary | ICD-10-CM

## 2011-06-19 DIAGNOSIS — Z0000 Encounter for general adult medical examination without abnormal findings: Secondary | ICD-10-CM

## 2011-06-19 LAB — LIPID PANEL
Total CHOL/HDL Ratio: 3
Triglycerides: 136 mg/dL (ref 0.0–149.0)

## 2011-06-19 LAB — BASIC METABOLIC PANEL
BUN: 14 mg/dL (ref 6–23)
CO2: 27 mEq/L (ref 19–32)
Chloride: 106 mEq/L (ref 96–112)
Creatinine, Ser: 0.5 mg/dL (ref 0.4–1.2)
Glucose, Bld: 138 mg/dL — ABNORMAL HIGH (ref 70–99)

## 2011-06-19 LAB — HEPATIC FUNCTION PANEL
AST: 47 U/L — ABNORMAL HIGH (ref 0–37)
Albumin: 4.5 g/dL (ref 3.5–5.2)
Alkaline Phosphatase: 65 U/L (ref 39–117)
Bilirubin, Direct: 0.1 mg/dL (ref 0.0–0.3)
Total Bilirubin: 0.7 mg/dL (ref 0.3–1.2)

## 2011-06-19 MED ORDER — FOSINOPRIL SODIUM 40 MG PO TABS: 40.0000 mg | ORAL_TABLET | Freq: Every day | ORAL | Status: DC

## 2011-06-19 MED ORDER — ROSUVASTATIN CALCIUM 10 MG PO TABS: 10.0000 mg | ORAL_TABLET | Freq: Every day | ORAL | Status: DC

## 2011-06-19 MED ORDER — METFORMIN HCL 500 MG PO TABS: 500.0000 mg | ORAL_TABLET | Freq: Every day | ORAL | Status: DC

## 2011-06-19 MED ORDER — LANSOPRAZOLE 30 MG PO CPDR: 30.0000 mg | DELAYED_RELEASE_CAPSULE | Freq: Every day | ORAL | Status: DC

## 2011-06-19 NOTE — Telephone Encounter (Signed)
Corrected and new rx sent in electronically

## 2011-06-19 NOTE — Assessment & Plan Note (Signed)
Weight loss encouraged Will check labs today

## 2011-06-19 NOTE — Progress Notes (Signed)
  Subjective:    Patient ID: Tracy Hammond, female    DOB: 02/25/1941, 70 y.o.   MRN: [id number]  HPI  cpx  Past Medical History  Diagnosis Date  . Diabetes mellitus   . Hypertension    Past Surgical History  Procedure Date  . Cholecystectomy   . Tubal ligation     reports that she has been smoking Cigarettes.  She has a 54 pack-year smoking history. She does not have any smokeless tobacco history on file. Her alcohol and drug histories not on file. family history includes Diabetes in her mother; Heart disease in her mother; Hypertension in an unspecified family member; and Kidney failure in her mother. No Known Allergies   Review of Systems  patient denies chest pain, shortness of breath, orthopnea. Denies lower extremity edema, abdominal pain, change in appetite, change in bowel movements. Patient denies rashes, musculoskeletal complaints. No other specific complaints in a complete review of systems.      Objective:   Physical Exam   Well-developed well-nourished female in no acute distress. HEENT exam atraumatic, normocephalic, extraocular muscles are intact. Neck is supple. No jugular venous distention no thyromegaly. Chest clear to auscultation without increased work of breathing. Cardiac exam S1 and S2 are regular. Abdominal exam active bowel sounds, soft, nontender. Extremities no edema. Neurologic exam she is alert without any motor sensory deficits. Gait is normal.       Assessment & Plan:

## 2011-06-19 NOTE — Assessment & Plan Note (Signed)
BP Readings from Last 3 Encounters:  06/19/11 142/70  03/24/11 130/70  11/13/10 140/72   Fair control--continue meds

## 2011-06-19 NOTE — Assessment & Plan Note (Signed)
Tolerating meds Check labs today 

## 2011-06-19 NOTE — Telephone Encounter (Signed)
Pharmacist called and needs clarification of Crestor 10 mg. The instructions are conflicting.

## 2011-08-03 ENCOUNTER — Other Ambulatory Visit: Payer: Self-pay | Admitting: Internal Medicine

## 2011-08-03 DIAGNOSIS — H6590 Unspecified nonsuppurative otitis media, unspecified ear: Secondary | ICD-10-CM

## 2011-11-10 ENCOUNTER — Ambulatory Visit (INDEPENDENT_AMBULATORY_CARE_PROVIDER_SITE_OTHER): Payer: Medicare Other | Admitting: Internal Medicine

## 2011-11-10 ENCOUNTER — Encounter: Payer: Self-pay | Admitting: Internal Medicine

## 2011-11-10 VITALS — BP 130/76 | Temp 98.2°F | Wt 174.0 lb

## 2011-11-10 DIAGNOSIS — E785 Hyperlipidemia, unspecified: Secondary | ICD-10-CM

## 2011-11-10 DIAGNOSIS — M545 Low back pain, unspecified: Secondary | ICD-10-CM

## 2011-11-10 DIAGNOSIS — I10 Essential (primary) hypertension: Secondary | ICD-10-CM

## 2011-11-10 DIAGNOSIS — E119 Type 2 diabetes mellitus without complications: Secondary | ICD-10-CM

## 2011-11-10 LAB — POCT URINALYSIS DIPSTICK
Bilirubin, UA: NEGATIVE
Blood, UA: NEGATIVE
Glucose, UA: NEGATIVE
Ketones, UA: NEGATIVE
Nitrite, UA: NEGATIVE
Spec Grav, UA: 1.02

## 2011-11-10 NOTE — Patient Instructions (Signed)
Most patients with low back pain will improve with time over the next two weeks.  Keep active but avoid any activities that cause pain.  Apply moist heat to the low back area several times daily.  Take 400-600 mg of ibuprofen ( Advil, Motrin) with food every 4 to 6 hours as needed for pain relief or control of fever  Call or return to clinic prn if these symptoms worsen or fail to improve as anticipated.

## 2011-11-10 NOTE — Progress Notes (Signed)
  Subjective:    Patient ID: Tracy Hammond, female    DOB: 10-May-1941, 71 y.o.   MRN: [id number]  HPI  71 year old patient who has a history of diabetes dyslipidemia and hypertension. She was stable until yesterday when she developed the lower bowel pain while out shopping. Then later started to involve the lumbar region as well. She had a fairly uncomfortable night due to the pain in the lower mid abdominal region as well as the lumbar region. She spent the night sleeping in a recliner and did much better pain is aggravated by movement. No urinary frequency or dysuria. No history of renal stone disease. She has been using ibuprofen and is much improved today. No history of chronic low back pain or diverticular disease; she is a tobacco user.    Review of Systems  Constitutional: Negative.   HENT: Negative for hearing loss, congestion, sore throat, rhinorrhea, dental problem, sinus pressure and tinnitus.   Eyes: Negative for pain, discharge and visual disturbance.  Respiratory: Negative for cough and shortness of breath.   Cardiovascular: Negative for chest pain, palpitations and leg swelling.  Gastrointestinal: Positive for abdominal pain. Negative for nausea, vomiting, diarrhea, constipation, blood in stool and abdominal distention.  Genitourinary: Negative for dysuria, urgency, frequency, hematuria, flank pain, vaginal bleeding, vaginal discharge, difficulty urinating, vaginal pain and pelvic pain.  Musculoskeletal: Positive for back pain. Negative for joint swelling, arthralgias and gait problem.  Skin: Negative for rash.  Neurological: Negative for dizziness, syncope, speech difficulty, weakness, numbness and headaches.  Hematological: Negative for adenopathy.  Psychiatric/Behavioral: Negative for behavioral problems, dysphoric mood and agitation. The patient is not nervous/anxious.        Objective:   Physical Exam  Constitutional: She is oriented to person, place, and time. She  appears well-developed and well-nourished.       Obese No acute distress Able to stand from a sitting position and transferred to the examining table without pain or assistance  Blood pressure normal Weight 174  HENT:  Head: Normocephalic.  Right Ear: External ear normal.  Left Ear: External ear normal.  Mouth/Throat: Oropharynx is clear and moist.  Eyes: Conjunctivae and EOM are normal. Pupils are equal, round, and reactive to light.  Neck: Normal range of motion. Neck supple. No thyromegaly present.  Cardiovascular: Normal rate, regular rhythm, normal heart sounds and intact distal pulses.   Pulmonary/Chest: Effort normal and breath sounds normal.  Abdominal: Soft. Bowel sounds are normal. She exhibits no mass. There is tenderness.       Very mild lower mid abdominal tenderness without guarding. Bowel sounds normal  Musculoskeletal: Normal range of motion.       No lumbar tenderness Straight leg testing normal  Lymphadenopathy:    She has no cervical adenopathy.  Neurological: She is alert and oriented to person, place, and time.  Skin: Skin is warm and dry. No rash noted.  Psychiatric: She has a normal mood and affect. Her behavior is normal.          Assessment & Plan:   Abdominal and back pain. Unclear etiology but seems to be improving. We'll continue ibuprofen and observe. Urinalysis reviewed and no evidence of infection or hematuria. Hypertension stable

## 2012-02-23 ENCOUNTER — Other Ambulatory Visit: Payer: Self-pay | Admitting: Dermatology

## 2012-06-20 ENCOUNTER — Encounter: Payer: Medicare Other | Admitting: Internal Medicine

## 2012-07-04 ENCOUNTER — Other Ambulatory Visit: Payer: Self-pay | Admitting: Internal Medicine

## 2012-07-15 ENCOUNTER — Ambulatory Visit (INDEPENDENT_AMBULATORY_CARE_PROVIDER_SITE_OTHER): Payer: Medicare Other | Admitting: Internal Medicine

## 2012-07-15 ENCOUNTER — Encounter: Payer: Medicare Other | Admitting: Internal Medicine

## 2012-07-15 ENCOUNTER — Encounter: Payer: Self-pay | Admitting: Internal Medicine

## 2012-07-15 VITALS — BP 146/84 | HR 70 | Temp 97.8°F | Ht 62.75 in | Wt 171.0 lb

## 2012-07-15 DIAGNOSIS — Z0000 Encounter for general adult medical examination without abnormal findings: Secondary | ICD-10-CM

## 2012-07-15 DIAGNOSIS — E119 Type 2 diabetes mellitus without complications: Secondary | ICD-10-CM

## 2012-07-15 DIAGNOSIS — Z79899 Other long term (current) drug therapy: Secondary | ICD-10-CM

## 2012-07-15 DIAGNOSIS — Z23 Encounter for immunization: Secondary | ICD-10-CM

## 2012-07-15 LAB — BASIC METABOLIC PANEL
BUN: 12 mg/dL (ref 6–23)
Chloride: 105 mEq/L (ref 96–112)
Potassium: 5.1 mEq/L (ref 3.5–5.1)

## 2012-07-15 LAB — LIPID PANEL
Cholesterol: 110 mg/dL (ref 0–200)
HDL: 41 mg/dL (ref >39.00)
LDL Cholesterol: 37 mg/dL (ref 0–99)
Triglycerides: 162 mg/dL — ABNORMAL HIGH (ref 0.0–149.0)

## 2012-07-15 LAB — CBC WITH DIFFERENTIAL/PLATELET
Basophils Relative: 0.5 % (ref 0.0–3.0)
Eosinophils Absolute: 0.2 10*3/uL (ref 0.0–0.7)
Hemoglobin: 15.3 g/dL — ABNORMAL HIGH (ref 12.0–15.0)
MCHC: 33.1 g/dL (ref 30.0–36.0)
MCV: 98.9 fl (ref 78.0–100.0)
Monocytes Absolute: 0.6 10*3/uL (ref 0.1–1.0)
Neutro Abs: 5 10*3/uL (ref 1.4–7.7)
Neutrophils Relative %: 56.9 % (ref 43.0–77.0)
RBC: 4.66 Mil/uL (ref 3.87–5.11)

## 2012-07-15 LAB — HEPATIC FUNCTION PANEL
ALT: 30 U/L (ref 0–35)
AST: 34 U/L (ref 0–37)
Total Bilirubin: 0.8 mg/dL (ref 0.3–1.2)

## 2012-07-15 MED ORDER — METFORMIN HCL 500 MG PO TABS: 500.0000 mg | ORAL_TABLET | Freq: Every day | ORAL | Status: DC

## 2012-07-15 MED ORDER — ROSUVASTATIN CALCIUM 10 MG PO TABS: 10.0000 mg | ORAL_TABLET | Freq: Every day | ORAL | Status: DC

## 2012-07-15 MED ORDER — FOSINOPRIL SODIUM 40 MG PO TABS: 40.0000 mg | ORAL_TABLET | Freq: Every day | ORAL | Status: DC

## 2012-07-15 MED ORDER — LANSOPRAZOLE 30 MG PO CPDR: 30.0000 mg | DELAYED_RELEASE_CAPSULE | Freq: Every day | ORAL | Status: DC

## 2012-07-17 NOTE — Progress Notes (Signed)
Patient ID: Tracy Hammond, female   DOB: Jan 15, 1941, 71 y.o.   MRN: [id number]  CPX  Past Medical History  Diagnosis Date  . Diabetes mellitus   . Hypertension     History   Social History  . Marital Status: Married    Spouse Name: N/A    Number of Children: N/A  . Years of Education: N/A   Occupational History  . Not on file.   Social History Main Topics  . Smoking status: Current Every Day Smoker -- 1.0 packs/day for 54 years    Types: Cigarettes  . Smokeless tobacco: Never Used  . Alcohol Use: Not on file  . Drug Use: Not on file  . Sexually Active: Not on file   Other Topics Concern  . Not on file   Social History Narrative  . No narrative on file    Past Surgical History  Procedure Date  . Cholecystectomy   . Tubal ligation     Family History  Problem Relation Age of Onset  . Hypertension      family hx  . Diabetes Mother   . Kidney failure Mother   . Heart disease Mother     No Known Allergies  Current Outpatient Prescriptions on File Prior to Visit  Medication Sig Dispense Refill  . aspirin 81 MG tablet Take 81 mg by mouth daily.        . Calcium Carbonate-Vit D-Min (CALCIUM 1200) 1200-1000 MG-UNIT CHEW Chew by mouth daily.        . fexofenadine (ALLEGRA) 180 MG tablet Take 180 mg by mouth daily.        . fosinopril (MONOPRIL) 40 MG tablet Take 1 tablet (40 mg total) by mouth daily.  90 tablet  2  . lansoprazole (PREVACID) 30 MG capsule Take 1 capsule (30 mg total) by mouth daily.  90 capsule  3  . metFORMIN (GLUCOPHAGE) 500 MG tablet Take 1 tablet (500 mg total) by mouth daily with breakfast.  90 tablet  3  . Multiple Vitamin (MULTIVITAMIN) tablet Take 1 tablet by mouth daily.        . rosuvastatin (CRESTOR) 10 MG tablet Take 1 tablet (10 mg total) by mouth daily. Or as directed  90 tablet  3     patient denies chest pain, shortness of breath, orthopnea. Denies lower extremity edema, abdominal pain, change in appetite, change in bowel movements.  Patient denies rashes, musculoskeletal complaints. No other specific complaints in a complete review of systems.   BP 146/84  Pulse 70  Temp 97.8 F (36.6 C) (Oral)  Ht 5' 2.75" (1.594 m)  Wt 171 lb (77.565 kg)  BMI 30.53 kg/m2  Well-developed well-nourished female in no acute distress. HEENT exam atraumatic, normocephalic, extraocular muscles are intact. Neck is supple. No jugular venous distention no thyromegaly. Chest clear to auscultation without increased work of breathing. Cardiac exam S1 and S2 are regular. Abdominal exam active bowel sounds, soft, nontender. Extremities no edema. Neurologic exam she is alert without any motor sensory deficits. Gait is normal.   A/P-  Well visit She nees to concentrate on aggressive weight loss, daily exercise and absolute tobacco abstinence

## 2012-08-24 ENCOUNTER — Emergency Department (HOSPITAL_COMMUNITY)
Admission: EM | Admit: 2012-08-24 | Discharge: 2012-08-24 | Disposition: A | Discharge status: Home / Self Care | Payer: Medicare Other | Attending: Emergency Medicine | Admitting: Emergency Medicine

## 2012-08-24 ENCOUNTER — Encounter (HOSPITAL_COMMUNITY): Payer: Self-pay | Admitting: Emergency Medicine

## 2012-08-24 DIAGNOSIS — F17200 Nicotine dependence, unspecified, uncomplicated: Secondary | ICD-10-CM | POA: Insufficient documentation

## 2012-08-24 DIAGNOSIS — I10 Essential (primary) hypertension: Secondary | ICD-10-CM | POA: Insufficient documentation

## 2012-08-24 DIAGNOSIS — Z79899 Other long term (current) drug therapy: Secondary | ICD-10-CM | POA: Insufficient documentation

## 2012-08-24 DIAGNOSIS — R040 Epistaxis: Secondary | ICD-10-CM | POA: Insufficient documentation

## 2012-08-24 DIAGNOSIS — Z7982 Long term (current) use of aspirin: Secondary | ICD-10-CM | POA: Insufficient documentation

## 2012-08-24 DIAGNOSIS — E7800 Pure hypercholesterolemia, unspecified: Secondary | ICD-10-CM | POA: Insufficient documentation

## 2012-08-24 DIAGNOSIS — E119 Type 2 diabetes mellitus without complications: Secondary | ICD-10-CM | POA: Insufficient documentation

## 2012-08-24 HISTORY — DX: Pure hypercholesterolemia, unspecified: E78.00

## 2012-08-24 MED ORDER — OXYMETAZOLINE HCL 0.05 % NA SOLN
1.0000 | Freq: Once | NASAL | Status: AC
  Administered 2012-08-24: 1 via NASAL
  Filled 2012-08-24: qty 15

## 2012-08-24 MED ORDER — LIDOCAINE-EPINEPHRINE 1 %-1:100000 IJ SOLN
20.0000 mL | Freq: Once | INTRAMUSCULAR | Status: AC
  Administered 2012-08-24: 20 mL
  Filled 2012-08-24: qty 20

## 2012-08-24 NOTE — ED Notes (Signed)
Per [facility], pt hx of diabetes and HTN v/s 190/98.  Pt nose began bleeding at 2020.  Afrin given en route, ice applied.  Pt taking Aspirin, 81mg  at home.

## 2012-08-24 NOTE — ED Notes (Signed)
GEX:BM84<XL> Expected date:<BR> Expected time:<BR> Means of arrival:<BR> Comments:<BR> EMS/uncontrolled nosebleed

## 2012-08-24 NOTE — ED Provider Notes (Signed)
History     CSN: [id number]  Arrival date & time 08/24/12  2117   First MD Initiated Contact with Patient 08/24/12 2132      Chief Complaint  Patient presents with  . Epistaxis    (Consider location/radiation/quality/duration/timing/severity/associated sxs/prior treatment) HPIBarbara J Hammond is a 71 y.o. female presenting with epistaxis. History of hypertension and diabetes, patient was putting her shoes on by bending over and at that time developed epistaxis. She could not get it to start to the EMS was called. They gave aspirin in route and applied ice and this did not stop the bleeding. Patient is only taking aspirin-81 mg a day. Symptoms are described as severe, constant since prior to arrival, no other alleviating or exacerbating factors and no other associated symptoms. She frankly denies any chest pain, shortness of breath, dizziness, lightheadedness, syncope, nausea vomiting or diarrhea.   Past Medical History  Diagnosis Date  . Diabetes mellitus   . Hypertension   . High cholesterol     Past Surgical History  Procedure Date  . Cholecystectomy   . Tubal ligation     Family History  Problem Relation Age of Onset  . Hypertension      family hx  . Diabetes Mother   . Kidney failure Mother   . Heart disease Mother     History  Substance Use Topics  . Smoking status: Current Every Day Smoker -- 1.0 packs/day for 54 years    Types: Cigarettes  . Smokeless tobacco: Never Used  . Alcohol Use: No    OB History    Grav Para Term Preterm Abortions TAB SAB Ect Mult Living                  Review of Systems At least 10pt or greater review of systems completed and are negative except where specified in the HPI.  Allergies  Review of patient's allergies indicates no known allergies.  Home Medications   Current Outpatient Rx  Name  Route  Sig  Dispense  Refill  . ASPIRIN 81 MG PO TABS   Oral   Take 81 mg by mouth daily.           Marland Kitchen BRIMONIDINE  TARTRATE-TIMOLOL 0.2-0.5 % OP SOLN   Both Eyes   Place 1 drop into both eyes every 12 (twelve) hours.         Marland Kitchen CALCIUM 1200 1200-1000 MG-UNIT PO CHEW   Oral   Chew by mouth daily.           Marland Kitchen FEXOFENADINE HCL 180 MG PO TABS   Oral   Take 180 mg by mouth daily.           . FOSINOPRIL SODIUM 40 MG PO TABS   Oral   Take 1 tablet (40 mg total) by mouth daily.   90 tablet   2   . IBUPROFEN 200 MG PO TABS   Oral   Take 800 mg by mouth every 6 (six) hours as needed. Pain         . LANSOPRAZOLE 30 MG PO CPDR   Oral   Take 1 capsule (30 mg total) by mouth daily.   90 capsule   3   . METFORMIN HCL 500 MG PO TABS   Oral   Take 1 tablet (500 mg total) by mouth daily with breakfast.   90 tablet   3   . ONE-DAILY MULTI VITAMINS PO TABS   Oral   Take 1 tablet  by mouth daily.           Marland Kitchen ROSUVASTATIN CALCIUM 10 MG PO TABS   Oral   Take 1 tablet (10 mg total) by mouth daily. Or as directed   90 tablet   3     BP 169/73  Pulse 76  Temp 97.7 F (36.5 C) (Oral)  Resp 18  Ht 5\' 2"  (1.575 m)  Wt 171 lb (77.565 kg)  BMI 31.28 kg/m2  SpO2 98%  Physical Exam  Nursing notes reviewed.  Electronic medical record reviewed. VITAL SIGNS:   Filed Vitals:   08/24/12 2118 08/24/12 2121 08/24/12 2319  BP:  169/73 152/104  Pulse:  76   Temp:  97.7 F (36.5 C)   TempSrc:  Oral   Resp:  18 75  Height:  5\' 2"  (1.575 m)   Weight:  171 lb (77.565 kg)   SpO2: 97% 98% 99%   CONSTITUTIONAL: Awake, oriented, appears non-toxic HENT: Atraumatic, normocephalic, oral mucosa pink and moist, airway patent. Nares patent with blood crusting at the left nare - bruising noted from anterior nasal septum in the region of Kiesselbach's plexus.Marland Kitchen External ears normal. EYES: Conjunctiva clear, EOMI, PERRLA NECK: Trachea midline, non-tender, supple CARDIOVASCULAR: Normal heart rate, Normal rhythm, No murmurs, rubs, gallops PULMONARY/CHEST: Clear to auscultation, no rhonchi, wheezes, or  rales. Symmetrical breath sounds. Non-tender. ABDOMINAL: Non-distended, obese, soft, non-tender - no rebound or guarding.  BS normal. NEUROLOGIC: Non-focal, moving all four extremities, no gross sensory or motor deficits. EXTREMITIES: No clubbing, cyanosis, or edema SKIN: Warm, Dry, No erythema, No rash  ED Course  Procedures (including critical care time)  Labs Reviewed - No data to display No results found.   1. Anterior epistaxis     MDM  Tracy Hammond is a 71 y.o. female presenting with epistaxis. Epistaxis was easily controlled with topical phenylephrine.  No packing required. Patient is given instructions on how to correct epistaxis should it occur again-she is given phenylephrine to go home with.   I explained the diagnosis and have given explicit precautions to return to the ER including uncontrolled epistaxis or any other new or worsening symptoms. The patient understands and accepts the medical plan as it's been dictated and I have answered their questions. Discharge instructions concerning home care and prescriptions have been given.  The patient is STABLE and is discharged to home in good condition.          Jones Skene, MD 08/25/12 [phone]

## 2012-08-24 NOTE — ED Notes (Signed)
Patient is resting comfortably. 

## 2012-08-27 ENCOUNTER — Emergency Department (HOSPITAL_COMMUNITY)
Admission: EM | Admit: 2012-08-27 | Discharge: 2012-08-27 | Disposition: A | Discharge status: Home / Self Care | Payer: Medicare Other | Attending: Emergency Medicine | Admitting: Emergency Medicine

## 2012-08-27 ENCOUNTER — Encounter (HOSPITAL_COMMUNITY): Payer: Self-pay | Admitting: Emergency Medicine

## 2012-08-27 DIAGNOSIS — E119 Type 2 diabetes mellitus without complications: Secondary | ICD-10-CM | POA: Insufficient documentation

## 2012-08-27 DIAGNOSIS — I10 Essential (primary) hypertension: Secondary | ICD-10-CM | POA: Insufficient documentation

## 2012-08-27 DIAGNOSIS — R040 Epistaxis: Secondary | ICD-10-CM | POA: Insufficient documentation

## 2012-08-27 DIAGNOSIS — F17200 Nicotine dependence, unspecified, uncomplicated: Secondary | ICD-10-CM | POA: Insufficient documentation

## 2012-08-27 DIAGNOSIS — Z79899 Other long term (current) drug therapy: Secondary | ICD-10-CM | POA: Insufficient documentation

## 2012-08-27 DIAGNOSIS — E7800 Pure hypercholesterolemia, unspecified: Secondary | ICD-10-CM | POA: Insufficient documentation

## 2012-08-27 DIAGNOSIS — Z7982 Long term (current) use of aspirin: Secondary | ICD-10-CM | POA: Insufficient documentation

## 2012-08-27 MED ORDER — AMOXICILLIN-POT CLAVULANATE 875-125 MG PO TABS: 1.0000 | ORAL_TABLET | Freq: Two times a day (BID) | ORAL | Status: DC

## 2012-08-27 NOTE — ED Notes (Signed)
No new bleeding noted from patients nose, pt denies bleeding down back of throat. Pt and family state they are ready for discharge, MD Dierdre Highman notified.

## 2012-08-27 NOTE — ED Notes (Signed)
Patient states that she has had nasal bleeding on and off since christmas. The patient reports that this episode started about 1 hour ago and she is unable to get it to stop. The patient denies any dizziness or lightheaded ness

## 2012-08-27 NOTE — Discharge Instructions (Signed)

## 2012-08-27 NOTE — ED Provider Notes (Signed)
History     CSN: [id number]  Arrival date & time 08/27/12  0021   First MD Initiated Contact with Patient 08/27/12 0240      Chief Complaint  Patient presents with  . Epistaxis    (Consider location/radiation/quality/duration/timing/severity/associated sxs/prior treatment) Patient is a 71 y.o. female presenting with nosebleeds. The history is provided by the patient and the spouse.  Epistaxis  This is a recurrent problem. The current episode started 3 to 5 hours ago. The problem occurs constantly. The problem has not changed since onset.The problem is associated with aspirin. The bleeding has been from the left nare. She has tried applying pressure for the symptoms. The treatment provided no relief.  evaluated here 4 days ago for the same, stopped with topical medications and sent home, recurrent tonight and not controlled at home. No FB, no new meds, no recent illness or congestion. No known aggrevating factors.   Past Medical History  Diagnosis Date  . Diabetes mellitus   . Hypertension   . High cholesterol     Past Surgical History  Procedure Date  . Cholecystectomy   . Tubal ligation     Family History  Problem Relation Age of Onset  . Hypertension      family hx  . Diabetes Mother   . Kidney failure Mother   . Heart disease Mother     History  Substance Use Topics  . Smoking status: Current Every Day Smoker -- 1.0 packs/day for 54 years    Types: Cigarettes  . Smokeless tobacco: Never Used  . Alcohol Use: No    OB History    Grav Para Term Preterm Abortions TAB SAB Ect Mult Living                  Review of Systems  Constitutional: Negative for fever and chills.  HENT: Positive for nosebleeds. Negative for neck pain and neck stiffness.   Eyes: Negative for pain.  Respiratory: Negative for shortness of breath.   Cardiovascular: Negative for chest pain.  Gastrointestinal: Negative for vomiting, abdominal pain and blood in stool.  Genitourinary:  Negative for dysuria.  Musculoskeletal: Negative for back pain.  Skin: Negative for rash.  Neurological: Negative for headaches.  All other systems reviewed and are negative.    Allergies  Review of patient's allergies indicates no known allergies.  Home Medications   Current Outpatient Rx  Name  Route  Sig  Dispense  Refill  . ASPIRIN 81 MG PO TABS   Oral   Take 81 mg by mouth every morning.          Marland Kitchen BRIMONIDINE TARTRATE-TIMOLOL 0.2-0.5 % OP SOLN   Both Eyes   Place 1 drop into both eyes every 12 (twelve) hours.         Marland Kitchen CALCIUM 1200 1200-1000 MG-UNIT PO CHEW   Oral   Chew 1 tablet by mouth daily as needed. For heartburn         . FEXOFENADINE HCL 180 MG PO TABS   Oral   Take 180 mg by mouth every morning.          . FOSINOPRIL SODIUM 40 MG PO TABS   Oral   Take 40 mg by mouth every morning.         . IBUPROFEN 200 MG PO TABS   Oral   Take 800 mg by mouth every 6 (six) hours as needed. Pain         . LANSOPRAZOLE 30 MG  PO CPDR   Oral   Take 30 mg by mouth every morning.         Marland Kitchen METFORMIN HCL 500 MG PO TABS   Oral   Take 1 tablet (500 mg total) by mouth daily with breakfast.   90 tablet   3   . ADULT MULTIVITAMIN W/MINERALS CH   Oral   Take 1 tablet by mouth every morning.         Marland Kitchen ROSUVASTATIN CALCIUM 10 MG PO TABS   Oral   Take 10 mg by mouth every evening. Or as directed           BP 174/58  Pulse 84  Temp 97.8 F (36.6 C) (Axillary)  Resp 18  SpO2 96%  Physical Exam  Constitutional: She is oriented to person, place, and time. She appears well-developed and well-nourished.  HENT:  Head: Normocephalic and atraumatic.  Eyes: EOM are normal. Pupils are equal, round, and reactive to light.       L nare mild oozing. R nare clear  Neck: Neck supple.  Cardiovascular: Regular rhythm and intact distal pulses.   Pulmonary/Chest: Effort normal. No respiratory distress.  Musculoskeletal: Normal range of motion. She exhibits no  edema.  Neurological: She is alert and oriented to person, place, and time.  Skin: Skin is warm and dry.    ED Course  EPISTAXIS MANAGEMENT Date/Time: 08/27/2012 2:51 AM Performed by: Sunnie Nielsen Authorized by: Sunnie Nielsen Consent: Verbal consent obtained. Risks and benefits: risks, benefits and alternatives were discussed Consent given by: patient Patient understanding: patient states understanding of the procedure being performed Patient consent: the patient's understanding of the procedure matches consent given Procedure consent: procedure consent matches procedure scheduled Required items: required blood products, implants, devices, and special equipment available Patient identity confirmed: verbally with patient Time out: Immediately prior to procedure a "time out" was called to verify the correct patient, procedure, equipment, support staff and site/side marked as required. Preparation: Patient was prepped and draped in the usual sterile fashion. Treatment site: left anterior Comments: neosynephrine and cotton ball, recheck still bleeding, unable to visualize bleeding site, packing placed. Left-sided bleeding resolved after packing placed.   (including critical care time)   MDM   Left-sided epistaxis. Bleeding site not visualized. Packing placed. Plan ENT followup. Augmentin provided. Return precautions verbalizes understood.  VS and nursing notes reviewed.        Sunnie Nielsen, MD 08/27/12 [phone]

## 2012-08-27 NOTE — ED Notes (Signed)
Pressure applied to nose via nasal clamp. Pt tolerating well, states she continues to spit up blood.

## 2012-08-27 NOTE — ED Notes (Signed)
MD Opitz at bedside. 

## 2012-08-27 NOTE — ED Notes (Signed)
Pt resting in bed, rhino rocket inserted by MD Dierdre Highman, pt tolerated well. Pt and family concerned about blood noted to end of cotton and state they are concerned that bleeding will continue around this. Explained that as cotton fills it will expand and apply pressure to nasal cavity to stop the bleeding, pt and family express continued concerns. MD Dierdre Highman notified and states that we will continue to observe patient for unwanted bleeding.

## 2012-08-31 HISTORY — PX: CATARACT EXTRACTION: SUR2

## 2012-09-07 HISTORY — PX: CATARACT EXTRACTION: SUR2

## 2012-12-26 ENCOUNTER — Ambulatory Visit (INDEPENDENT_AMBULATORY_CARE_PROVIDER_SITE_OTHER): Payer: Medicare Other | Admitting: Family Medicine

## 2012-12-26 ENCOUNTER — Encounter: Payer: Self-pay | Admitting: Family Medicine

## 2012-12-26 VITALS — BP 130/70 | HR 89 | Temp 98.3°F | Wt 170.0 lb

## 2012-12-26 DIAGNOSIS — F17200 Nicotine dependence, unspecified, uncomplicated: Secondary | ICD-10-CM

## 2012-12-26 DIAGNOSIS — J209 Acute bronchitis, unspecified: Secondary | ICD-10-CM

## 2012-12-26 MED ORDER — FLUTICASONE PROPIONATE HFA 44 MCG/ACT IN AERO: 2.0000 | INHALATION_SPRAY | Freq: Two times a day (BID) | RESPIRATORY_TRACT | Status: DC

## 2012-12-26 MED ORDER — AZITHROMYCIN 250 MG PO TABS: ORAL_TABLET | ORAL | Status: DC

## 2012-12-26 MED ORDER — HYDROCODONE-HOMATROPINE 5-1.5 MG/5ML PO SYRP: 5.0000 mL | ORAL_SOLUTION | Freq: Every evening | ORAL | Status: DC | PRN

## 2012-12-26 NOTE — Progress Notes (Signed)
Chief Complaint  Patient presents with  . Cough    deep; continues to get worse; headache    HPI:  Acute visit for cough: -started 5 days ago -symptoms: nasal congestion, drainage in throat, cough, headache -denies: fevers, SOB, NVD,Toothpain -Has ENT doctor for recurrent fluid in ear -hx of bronchitis -smoker, denies COPD  ROS: See pertinent positives and negatives per HPI.  Past Medical History  Diagnosis Date  . Diabetes mellitus   . Hypertension   . High cholesterol     Family History  Problem Relation Age of Onset  . Hypertension      family hx  . Diabetes Mother   . Kidney failure Mother   . Heart disease Mother     History   Social History  . Marital Status: Married    Spouse Name: N/A    Number of Children: N/A  . Years of Education: N/A   Social History Main Topics  . Smoking status: Current Every Day Smoker -- 1.00 packs/day for 54 years    Types: Cigarettes  . Smokeless tobacco: Never Used  . Alcohol Use: No  . Drug Use: None  . Sexually Active: None   Other Topics Concern  . None   Social History Narrative  . None    Current outpatient prescriptions:aspirin 81 MG tablet, Take 81 mg by mouth every morning. , Disp: , Rfl: ;  brimonidine-timolol (COMBIGAN) 0.2-0.5 % ophthalmic solution, Place 1 drop into both eyes every 12 (twelve) hours., Disp: , Rfl: ;  Calcium Carbonate-Vit D-Min (CALCIUM 1200) 1200-1000 MG-UNIT CHEW, Chew 1 tablet by mouth daily as needed. For heartburn, Disp: , Rfl:  fexofenadine (ALLEGRA) 180 MG tablet, Take 180 mg by mouth every morning. , Disp: , Rfl: ;  fosinopril (MONOPRIL) 40 MG tablet, Take 40 mg by mouth every morning., Disp: , Rfl: ;  ibuprofen (ADVIL,MOTRIN) 200 MG tablet, Take 800 mg by mouth every 6 (six) hours as needed. Pain, Disp: , Rfl: ;  lansoprazole (PREVACID) 30 MG capsule, Take 30 mg by mouth every morning., Disp: , Rfl:  metFORMIN (GLUCOPHAGE) 500 MG tablet, Take 1 tablet (500 mg total) by mouth daily with  breakfast., Disp: 90 tablet, Rfl: 3;  Multiple Vitamin (MULTIVITAMIN WITH MINERALS) TABS, Take 1 tablet by mouth every morning., Disp: , Rfl: ;  rosuvastatin (CRESTOR) 10 MG tablet, Take 10 mg by mouth every evening. Or as directed, Disp: , Rfl: ;  azithromycin (ZITHROMAX) 250 MG tablet, 2 tabs on the first day, 1 tab daily after that, Disp: 6 tablet, Rfl: 0 fluticasone (FLOVENT HFA) 44 MCG/ACT inhaler, Inhale 2 puffs into the lungs 2 (two) times daily. For the next 2 weeks, Disp: 1 Inhaler, Rfl: 0;  HYDROcodone-homatropine (HYCODAN) 5-1.5 MG/5ML syrup, Take 5 mLs by mouth at bedtime as needed for cough., Disp: 120 mL, Rfl: 0;  [DISCONTINUED] lansoprazole (PREVACID) 30 MG capsule, Take 1 capsule (30 mg total) by mouth daily., Disp: 90 capsule, Rfl: 3 [DISCONTINUED] rosuvastatin (CRESTOR) 10 MG tablet, Take 1 tablet (10 mg total) by mouth daily. Or as directed, Disp: 90 tablet, Rfl: 3  EXAM:  Filed Vitals:   12/26/12 1045  BP: 130/70  Pulse: 89  Temp: 98.3 F (36.8 C)    Body mass index is 31.09 kg/(m^2).  GENERAL: vitals reviewed and listed above, alert, oriented, appears well hydrated and in no acute distress  HEENT: atraumatic, conjunttiva clear, no obvious abnormalities on inspection of external nose and ears, normal appearance of ear canals and TMs,  clear nasal congestion, mild post oropharyngeal erythema with PND, no tonsillar edema or exudate, no sinus TTP  NECK: no obvious masses on inspection  LUNGS: diffuse wheezing, no rhonchi  CV: HRRR, no peripheral edema  MS: moves all extremities without noticeable abnormality  PSYCH: pleasant and cooperative, no obvious depression or anxiety  ASSESSMENT AND PLAN:  Discussed the following assessment and plan:  Acute bronchitis - Plan: azithromycin (ZITHROMAX) 250 MG tablet, fluticasone (FLOVENT HFA) 44 MCG/ACT inhaler, HYDROcodone-homatropine (HYCODAN) 5-1.5 MG/5ML syrup  CIGARETTE SMOKER - Plan: azithromycin (ZITHROMAX) 250 MG  tablet, fluticasone (FLOVENT HFA) 44 MCG/ACT inhaler, HYDROcodone-homatropine (HYCODAN) 5-1.5 MG/5ML syrup  -acute bronchits - tx with antibiotics and ICS - discussed risks, return precautions. Advised to quit smoking, advised could have COPD - follow up with PCP next available to ensure improved and help with quitting smoking. -Patient advised to return or notify a doctor immediately if symptoms worsen or persist or new concerns arise.  There are no Patient Instructions on file for this visit.   Kriste Basque R.

## 2013-01-02 ENCOUNTER — Telehealth: Payer: Self-pay | Admitting: Internal Medicine

## 2013-01-02 NOTE — Telephone Encounter (Signed)
Patient Information:  Caller Name: Abraham  Phone: [phone]  Patient: Tracy Hammond, Tracy Hammond  Gender: Female  DOB: 05/05/1941  Age: 72 Years  PCP: Birdie Sons (Adults only)  Office Follow Up:  Does the office need to follow up with this patient?: Yes  Instructions For The Office: Pt states Zpak never works for her and needs another antibiotic called in.  RN Note:  Pt states she needs another antibiotic.  Symptoms  Reason For Call & Symptoms: Pt states she was seen in the office on 12/26/12 and  diagnosed with Bronchitis and given Zpak.  Pt states a Zpak never works for her and she is no better.  Pt states she needs another antibiotic.  Reviewed Health History In EMR: Yes  Reviewed Medications In EMR: Yes  Reviewed Allergies In EMR: Yes  Reviewed Surgeries / Procedures: Yes  Date of Onset of Symptoms: 12/26/2012  Treatments Tried: Zpak, inhaler  Treatments Tried Worked: No  Guideline(s) Used:  Cough  Disposition Per Guideline:   See Today or Tomorrow in Office  Reason For Disposition Reached:   Continuous (nonstop) coughing interferes with work or school and no improvement using cough treatment per Care Advice  Advice Given:  Avoid Tobacco Smoke:  Smoking or being exposed to smoke makes coughs much worse.  Reassurance  Coughing is the way that our lungs remove irritants and mucus. It helps protect our lungs from getting pneumonia.  You can get a dry hacking cough after a chest cold. Sometimes this type of cough can last 1-3 weeks, and be worse at night.  You can also get a cough after being exposed to irritating substances like smoke, strong perfumes, and dust.  Here is some care advice that should help.  Coughing Spasms:  Drink warm fluids. Inhale warm mist (Reason: both relax the airway and loosen up the phlegm).  Suck on cough drops or hard candy to coat the irritated throat.  Prevent Dehydration:  Drink adequate liquids.  This will help soothe an irritated or dry  throat and loosen up the phlegm.  Avoid Tobacco Smoke:  Smoking or being exposed to smoke makes coughs much worse.  Call Back If:  Difficulty breathing  Cough lasts more than 3 weeks  Fever lasts > 3 days  You become worse.  RN Overrode Recommendation:  Patient Requests Prescription  Pt reuqesting another antibiotic.

## 2013-01-03 MED ORDER — DOXYCYCLINE HYCLATE 100 MG PO TABS: 100.0000 mg | ORAL_TABLET | Freq: Two times a day (BID) | ORAL | Status: DC

## 2013-01-03 NOTE — Telephone Encounter (Signed)
Doxycycline 100 mg po bid for 10 days. 

## 2013-01-03 NOTE — Telephone Encounter (Signed)
rx sent in electronically, pt aware 

## 2013-01-03 NOTE — Telephone Encounter (Signed)
Pt following up, please send to [facility] on [address].

## 2013-03-27 ENCOUNTER — Ambulatory Visit: Payer: Medicare Other | Admitting: Internal Medicine

## 2013-05-16 ENCOUNTER — Encounter: Payer: Self-pay | Admitting: Internal Medicine

## 2013-06-05 ENCOUNTER — Ambulatory Visit (INDEPENDENT_AMBULATORY_CARE_PROVIDER_SITE_OTHER): Payer: Medicare Other | Admitting: Internal Medicine

## 2013-06-05 ENCOUNTER — Encounter: Payer: Self-pay | Admitting: Internal Medicine

## 2013-06-05 VITALS — BP 130/76 | HR 85 | Temp 98.0°F | Resp 20 | Wt 173.0 lb

## 2013-06-05 DIAGNOSIS — E119 Type 2 diabetes mellitus without complications: Secondary | ICD-10-CM

## 2013-06-05 DIAGNOSIS — F17200 Nicotine dependence, unspecified, uncomplicated: Secondary | ICD-10-CM

## 2013-06-05 DIAGNOSIS — I10 Essential (primary) hypertension: Secondary | ICD-10-CM

## 2013-06-05 DIAGNOSIS — J209 Acute bronchitis, unspecified: Secondary | ICD-10-CM

## 2013-06-05 MED ORDER — HYDROCODONE-HOMATROPINE 5-1.5 MG/5ML PO SYRP: 5.0000 mL | ORAL_SOLUTION | Freq: Every evening | ORAL | Status: DC | PRN

## 2013-06-05 NOTE — Progress Notes (Signed)
Subjective:    Patient ID: Tracy Hammond, female    DOB: 1941-07-30, 72 y.o.   MRN: [id number]  HPI  72 year old chronic tobacco user with a history of hypertension and diabetes. She presents with a three-day history of increasing chest congestion and nonproductive cough. There's been no real wheezing. Cough is incessant and interfering with sleep. No fever or current sputum production.  Past Medical History  Diagnosis Date  . Diabetes mellitus   . Hypertension   . High cholesterol     History   Social History  . Marital Status: Married    Spouse Name: N/A    Number of Children: N/A  . Years of Education: N/A   Occupational History  . Not on file.   Social History Main Topics  . Smoking status: Current Every Day Smoker -- 1.00 packs/day for 54 years    Types: Cigarettes  . Smokeless tobacco: Never Used  . Alcohol Use: No  . Drug Use: Not on file  . Sexual Activity: Not on file   Other Topics Concern  . Not on file   Social History Narrative  . No narrative on file    Past Surgical History  Procedure Laterality Date  . Cholecystectomy    . Tubal ligation      Family History  Problem Relation Age of Onset  . Hypertension      family hx  . Diabetes Mother   . Kidney failure Mother   . Heart disease Mother     No Known Allergies  Current Outpatient Prescriptions on File Prior to Visit  Medication Sig Dispense Refill  . aspirin 81 MG tablet Take 81 mg by mouth every morning.       . brimonidine-timolol (COMBIGAN) 0.2-0.5 % ophthalmic solution Place 1 drop into both eyes every 12 (twelve) hours.      . Calcium Carbonate-Vit D-Min (CALCIUM 1200) 1200-1000 MG-UNIT CHEW Chew 1 tablet by mouth daily as needed. For heartburn      . fosinopril (MONOPRIL) 40 MG tablet Take 40 mg by mouth every morning.      Marland Kitchen ibuprofen (ADVIL,MOTRIN) 200 MG tablet Take 800 mg by mouth every 6 (six) hours as needed. Pain      . lansoprazole (PREVACID) 30 MG capsule Take 30 mg by  mouth every morning.      . metFORMIN (GLUCOPHAGE) 500 MG tablet Take 1 tablet (500 mg total) by mouth daily with breakfast.  90 tablet  3  . Multiple Vitamin (MULTIVITAMIN WITH MINERALS) TABS Take 1 tablet by mouth every morning.      . rosuvastatin (CRESTOR) 10 MG tablet Take 10 mg by mouth every evening. Or as directed      . fluticasone (FLOVENT HFA) 44 MCG/ACT inhaler Inhale 2 puffs into the lungs 2 (two) times daily. For the next 2 weeks  1 Inhaler  0  . [DISCONTINUED] lansoprazole (PREVACID) 30 MG capsule Take 1 capsule (30 mg total) by mouth daily.  90 capsule  3  . [DISCONTINUED] rosuvastatin (CRESTOR) 10 MG tablet Take 1 tablet (10 mg total) by mouth daily. Or as directed  90 tablet  3   No current facility-administered medications on file prior to visit.    BP 130/76  Pulse 85  Temp(Src) 98 F (36.7 C) (Oral)  Resp 20  Wt 173 lb (78.472 kg)  BMI 31.63 kg/m2  SpO2 95%       Review of Systems  Constitutional: Negative.   HENT: Positive for congestion.  Negative for hearing loss, sore throat, rhinorrhea, dental problem, sinus pressure and tinnitus.   Eyes: Negative for pain, discharge and visual disturbance.  Respiratory: Positive for cough. Negative for shortness of breath.   Cardiovascular: Negative for chest pain, palpitations and leg swelling.  Gastrointestinal: Negative for nausea, vomiting, abdominal pain, diarrhea, constipation, blood in stool and abdominal distention.  Genitourinary: Negative for dysuria, urgency, frequency, hematuria, flank pain, vaginal bleeding, vaginal discharge, difficulty urinating, vaginal pain and pelvic pain.  Musculoskeletal: Negative for joint swelling, arthralgias and gait problem.  Skin: Negative for rash.  Neurological: Negative for dizziness, syncope, speech difficulty, weakness, numbness and headaches.  Hematological: Negative for adenopathy.  Psychiatric/Behavioral: Negative for behavioral problems, dysphoric mood and agitation.  The patient is not nervous/anxious.        Objective:   Physical Exam  Constitutional: She is oriented to person, place, and time. She appears well-developed and well-nourished.  HENT:  Head: Normocephalic.  Right Ear: External ear normal.  Left Ear: External ear normal.  Mouth/Throat: Oropharynx is clear and moist.  Eyes: Conjunctivae and EOM are normal. Pupils are equal, round, and reactive to light.  Neck: Normal range of motion. Neck supple. No thyromegaly present.  Cardiovascular: Normal rate, regular rhythm, normal heart sounds and intact distal pulses.   Pulmonary/Chest: Effort normal. No respiratory distress. She has wheezes.  Scattered rhonchi and a few faint wheezes  Abdominal: Soft. Bowel sounds are normal. She exhibits no mass. There is no tenderness.  Musculoskeletal: Normal range of motion.  Lymphadenopathy:    She has no cervical adenopathy.  Neurological: She is alert and oriented to person, place, and time.  Skin: Skin is warm and dry. No rash noted.  Psychiatric: She has a normal mood and affect. Her behavior is normal.          Assessment & Plan:   Viral bronchitis with cough and mild bronchospasm. Will treat symptomatically with Mucinex DM as well as Hycodan. Total smoking cessation encouraged Hypertension stable  We'll substitute  Advair for Flovent short-term

## 2013-06-05 NOTE — Patient Instructions (Signed)
Smoking tobacco is very bad for your health. You should stop smoking immediately.  Use Advair twice daily  Acute bronchitis symptoms for less than 10 days are generally not helped by antibiotics.  Take over-the-counter expectorants and cough medications such as  Mucinex DM.  Call if there is no improvement in 5 to 7 days or if he developed worsening cough, fever, or new symptoms, such as shortness of breath or chest pain.

## 2013-06-12 ENCOUNTER — Encounter: Payer: Self-pay | Admitting: Internal Medicine

## 2013-06-14 ENCOUNTER — Encounter: Payer: Self-pay | Admitting: Gastroenterology

## 2013-06-14 ENCOUNTER — Encounter: Payer: Self-pay | Admitting: Internal Medicine

## 2013-06-19 ENCOUNTER — Encounter: Payer: Self-pay | Admitting: Internal Medicine

## 2013-06-19 ENCOUNTER — Ambulatory Visit (INDEPENDENT_AMBULATORY_CARE_PROVIDER_SITE_OTHER): Payer: Medicare Other | Admitting: Internal Medicine

## 2013-06-19 VITALS — BP 130/60 | HR 64 | Ht 62.0 in | Wt 172.1 lb

## 2013-06-19 DIAGNOSIS — Z860100 Personal history of colon polyps, unspecified: Secondary | ICD-10-CM

## 2013-06-19 DIAGNOSIS — R195 Other fecal abnormalities: Secondary | ICD-10-CM

## 2013-06-19 MED ORDER — NA SULFATE-K SULFATE-MG SULF 17.5-3.13-1.6 GM/177ML PO SOLN: ORAL | Status: DC

## 2013-06-19 NOTE — Assessment & Plan Note (Signed)
She had hemorrhoids only last colonoscopy this may be the cause of her heme positive stool. She is in a routine colonoscopy followup and surveillance program so routine Hemoccults are not necessary going forward. I explained that to the patient today.

## 2013-06-19 NOTE — Assessment & Plan Note (Signed)
Time for colonoscopy surveillance The risks and benefits as well as alternatives of endoscopic procedure(s) have been discussed and reviewed. All questions answered. The patient agrees to proceed.

## 2013-06-19 NOTE — Patient Instructions (Signed)
You have been scheduled for a colonoscopy with propofol. Please follow written instructions given to you at your visit today.  Please pick up your prep kit at the pharmacy within the next 1-3 days. If you use inhalers (even only as needed), please bring them with you on the day of your procedure. Your physician has requested that you go to www.startemmi.com and enter the access code given to you at your visit today. This web site gives a general overview about your procedure. However, you should still follow specific instructions given to you by our office regarding your preparation for the procedure.  It is the time of year to have a vaccination to prevent the flu (influenza virus).  Please have this done through your primary care provider or you can get this done at local pharmacies or the [facility]. It would be very helpful if you notify your primary care provider when and where you had the vaccination given by messaging them in My Chart, leaving a message or faxing the information.  I appreciate the opportunity to care for you.  

## 2013-06-19 NOTE — Progress Notes (Signed)
Subjective:    Patient ID: Tracy Hammond, female    DOB: [birth year], 72 y.o.   MRN: [id number]  HPI The patient is a very nice 72 year old woman known to me from colonoscopy in October 2009. At that point she had a diminutive cecal adenoma and a diminutive cecal hyperplastic polyp. She had a routine gynecologic examination recently, and she was found to be Hemoccult positive on digital rectal exam. She denies any active bleeding or change in bowel habits though she has multiple bowel movements every morning after breakfast. This is a stable pattern. She has heartburn and indigestion problems are well controlled by Prevacid, there is no dysphagia, bleeding or weight loss that is unintentional. She is trying to quit smoking. She has not yet had a flu shot but intends to get one.  No Known Allergies Outpatient Prescriptions Prior to Visit  Medication Sig Dispense Refill  . aspirin 81 MG tablet Take 81 mg by mouth every morning.       . brimonidine-timolol (COMBIGAN) 0.2-0.5 % ophthalmic solution Place 1 drop into both eyes every 12 (twelve) hours.      . Calcium Carbonate-Vit D-Min (CALCIUM 1200) 1200-1000 MG-UNIT CHEW Chew 1 tablet by mouth daily as needed. For heartburn      . fluticasone (FLOVENT HFA) 44 MCG/ACT inhaler Inhale 2 puffs into the lungs 2 (two) times daily. For the next 2 weeks  1 Inhaler  0  . fosinopril (MONOPRIL) 40 MG tablet Take 40 mg by mouth every morning.      Marland Kitchen ibuprofen (ADVIL,MOTRIN) 200 MG tablet Take 800 mg by mouth every 6 (six) hours as needed. Pain      . lansoprazole (PREVACID) 30 MG capsule Take 30 mg by mouth every morning.      . metFORMIN (GLUCOPHAGE) 500 MG tablet Take 1 tablet (500 mg total) by mouth daily with breakfast.  90 tablet  3  . Multiple Vitamin (MULTIVITAMIN WITH MINERALS) TABS Take 1 tablet by mouth every morning.      . rosuvastatin (CRESTOR) 10 MG tablet Take 10 mg by mouth every evening. Or as directed      . HYDROcodone-homatropine (HYCODAN)  5-1.5 MG/5ML syrup Take 5 mLs by mouth at bedtime as needed for cough.  120 mL  0   No facility-administered medications prior to visit.   Past Medical History  Diagnosis Date  . Diabetes mellitus   . Hypertension   . High cholesterol   . Personal history of colonic adenoma 06/12/2008   Past Surgical History  Procedure Laterality Date  . Cholecystectomy    . Tubal ligation    . Bladder tact    . Tonsillectomy and adenoidectomy    . Skin cancer excision    . Colonoscopy     History   Social History  . Marital Status: Married    Spouse Name: N/A    Number of Children: 3  . Years of Education: N/A   Occupational History  . housewife    Social History Main Topics  . Smoking status: Current Every Day Smoker -- 1.00 packs/day for 54 years    Types: Cigarettes  . Smokeless tobacco: Never Used  . Alcohol Use: No  . Drug Use: No   Social History Narrative   The patient is married. 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 and 2 daughters   3 caffeinated beverages daily   06/19/2013         Family History  Problem Relation Age of Onset  .  Hypertension Father     family hx  . Diabetes Mother   . Kidney failure Mother   . Heart disease Mother   . Stroke Sister   . Other Sister     Leg Disease   Review of Systems This is positive for those things mentiones in the HPI, also positive for allergies, arthritis pain, recent bronchitis with cough, insomnia, myalgias, fatigued.. All other review of systems are negative.      Objective:   Physical Exam General:  NAD Eyes:   anicteric Lungs:  clear Heart:  S1S2 no rubs, murmurs or gallops Abdomen:  soft and nontender, BS+, no organomegaly or mass bowel sounds are present it is moderately obese Ext:   no edema   Data Reviewed:  2009 colonoscopy and pathology    Assessment & Plan:   1. Heme positive stool   2. Personal history of colonic adenoma

## 2013-06-21 ENCOUNTER — Encounter: Payer: Self-pay | Admitting: Internal Medicine

## 2013-07-17 ENCOUNTER — Encounter: Payer: Medicare Other | Admitting: Internal Medicine

## 2013-07-24 ENCOUNTER — Telehealth: Payer: Self-pay | Admitting: Internal Medicine

## 2013-07-24 NOTE — Telephone Encounter (Signed)
Returned patient's call and she states that she took her Suprep 6 am instead of 6 pm today.  She states her husband looked at the instructions wrong.  I advised her that she should be okay as long as she takes her second dose of prep at 11 am in the morning as instructed.  She also states that she has been on clear liquids the entire day today.  I advised her also that if her stools are not clear and cloudy to call us in the morning before her procedure and let us know. She agreed to do so.

## 2013-07-25 ENCOUNTER — Ambulatory Visit (AMBULATORY_SURGERY_CENTER): Payer: Medicare Other | Admitting: Internal Medicine

## 2013-07-25 ENCOUNTER — Encounter: Payer: Self-pay | Admitting: Internal Medicine

## 2013-07-25 VITALS — BP 150/80 | HR 73 | Temp 97.5°F | Resp 26 | Ht 62.0 in | Wt 172.0 lb

## 2013-07-25 DIAGNOSIS — Z860100 Personal history of colon polyps, unspecified: Secondary | ICD-10-CM

## 2013-07-25 DIAGNOSIS — R195 Other fecal abnormalities: Secondary | ICD-10-CM

## 2013-07-25 DIAGNOSIS — K5730 Diverticulosis of large intestine without perforation or abscess without bleeding: Secondary | ICD-10-CM

## 2013-07-25 DIAGNOSIS — D126 Benign neoplasm of colon, unspecified: Secondary | ICD-10-CM

## 2013-07-25 LAB — GLUCOSE, CAPILLARY
Glucose-Capillary: 101 mg/dL — ABNORMAL HIGH (ref 70–99)
Glucose-Capillary: 101 mg/dL — ABNORMAL HIGH (ref 70–99)

## 2013-07-25 MED ORDER — SODIUM CHLORIDE 0.9 % IV SOLN: 500.0000 mL | INTRAVENOUS | Status: DC

## 2013-07-25 MED ORDER — ASPIRIN 81 MG PO TABS: 81.0000 mg | ORAL_TABLET | Freq: Every morning | ORAL | Status: AC

## 2013-07-25 NOTE — Op Note (Signed)
[facility] [address] [place], [address]   COLONOSCOPY PROCEDURE REPORT  PATIENT: Tracy, Hammond.  MR#: [id number] BIRTHDATE: 06/27/1941 , 72  yrs. old GENDER: Female ENDOSCOPIST: Iva Boop, MD, [facility] PROCEDURE DATE:  07/25/2013 PROCEDURE:   Colonoscopy with snare polypectomy First Screening Colonoscopy - Avg.  risk and is 50 yrs.  old or older - No.  Prior Negative Screening - Now for repeat screening. N/A  History of Adenoma - Now for follow-up colonoscopy & has been > or = to 3 yrs.  Yes hx of adenoma.  Has been 3 or more years since last colonoscopy.  Polyps Removed Today? Yes. ASA CLASS:   Class III INDICATIONS:Patient's personal history of adenomatous colon polyps and heme-positive stool. MEDICATIONS: propofol (Diprivan) 200mg  IV, MAC sedation, administered by CRNA, and These medications were titrated to patient response per physician's verbal order  DESCRIPTION OF PROCEDURE:   After the risks benefits and alternatives of the procedure were thoroughly explained, informed consent was obtained.  A digital rectal exam revealed no abnormalities of the rectum.   The LB [id number]-[id number]  endoscope was introduced through the anus and advanced to the cecum, which was identified by both the appendix and ileocecal valve. No adverse events experienced.   The quality of the prep was excellent using Suprep  The instrument was then slowly withdrawn as the colon was fully examined.   COLON FINDINGS: Two sessile polyps measuring 3 and 15 mm in size were found at the cecum and in the ascending colon.  A polypectomy was performed with a cold snare and using snare cautery.  The resection was complete and the polyp tissue was completely retrieved.   Severe diverticulosis was noted throughout the entire examined colon.   The colon mucosa was otherwise normal. Retroflexed views revealed no abnormalities. The time to cecum=1 minutes 48 seconds.  Withdrawal time=14  minutes 37 seconds.  The scope was withdrawn and the procedure completed. COMPLICATIONS: There were no complications.  ENDOSCOPIC IMPRESSION: 1.   Two sessile polyps measuring 3 and 15 mm in size were found at the cecum and in the ascending colon; polypectomy was performed with a cold snare and using snare cautery 2.   Severe diverticulosis was noted throughout the entire examined colon 3.   The colon mucosa was otherwise normal - excellent prep  RECOMMENDATIONS: 1.  Hold aspirin, aspirin products, and anti-inflammatory medication for 2 weeks. 2.  Timing of repeat colonoscopy will be determined by pathology findings in patient w/ hx diminutive cecal adenoma 2009  eSigned:  Iva Boop, MD, [facility] 07/25/2013 4:00 PM  cc: The Patient

## 2013-07-25 NOTE — Progress Notes (Signed)
Report to pacu rn, vss, bbs=clear 

## 2013-07-25 NOTE — Progress Notes (Signed)
No egg or soy allergy. ewm No issues with past sedation. ewm  

## 2013-07-25 NOTE — Progress Notes (Signed)
Patient did not experience any of the following events: a burn prior to discharge; a fall within the facility; wrong site/side/patient/procedure/implant event; or a hospital transfer or hospital admission upon discharge from the facility. (G8907) Patient did not have preoperative order for IV antibiotic SSI prophylaxis. (G8918)  

## 2013-07-25 NOTE — Patient Instructions (Addendum)
I found and removed two polyps that look benign. You also have a condition called diverticulosis - common and not usually a problem. Please read the handout provided.  I will let you know pathology results and when to have another routine colonoscopy by mail.  It is the time of year to have a vaccination to prevent the flu (influenza virus). Please have this done through your primary care provider or you can get this done at local pharmacies or the [facility]. It would be very helpful if you notify your primary care provider when and where you had the vaccination given by messaging them in My Chart, leaving a message or faxing the information.  I appreciate the opportunity to care for you. Iva Boop, MD, [facility]    Discharge instructions given with verbal understanding. Handouts on polyps and diverticulosis. Resume previous medications. YOU HAD AN ENDOSCOPIC PROCEDURE TODAY AT THE [facility]: Refer to the procedure report that was given to you for any specific questions about what was found during the examination.  If the procedure report does not answer your questions, please call your gastroenterologist to clarify.  If you requested that your care partner not be given the details of your procedure findings, then the procedure report has been included in a sealed envelope for you to review at your convenience later.  YOU SHOULD EXPECT: Some feelings of bloating in the abdomen. Passage of more gas than usual.  Walking can help get rid of the air that was put into your GI tract during the procedure and reduce the bloating. If you had a lower endoscopy (such as a colonoscopy or flexible sigmoidoscopy) you may notice spotting of blood in your stool or on the toilet paper. If you underwent a bowel prep for your procedure, then you may not have a normal bowel movement for a few days.  DIET: Your first meal following the procedure should be a light meal and then it is ok to progress  to your normal diet.  A half-sandwich or bowl of soup is an example of a good first meal.  Heavy or fried foods are harder to digest and may make you feel nauseous or bloated.  Likewise meals heavy in dairy and vegetables can cause extra gas to form and this can also increase the bloating.  Drink plenty of fluids but you should avoid alcoholic beverages for 24 hours.  ACTIVITY: Your care partner should take you home directly after the procedure.  You should plan to take it easy, moving slowly for the rest of the day.  You can resume normal activity the day after the procedure however you should NOT DRIVE or use heavy machinery for 24 hours (because of the sedation medicines used during the test).    SYMPTOMS TO REPORT IMMEDIATELY: A gastroenterologist can be reached at any hour.  During normal business hours, 8:30 AM to 5:00 PM Monday through Friday, call [phone].  After hours and on weekends, please call the GI answering service at [phone] who will take a message and have the physician on call contact you.   Following lower endoscopy (colonoscopy or flexible sigmoidoscopy):  Excessive amounts of blood in the stool  Significant tenderness or worsening of abdominal pains  Swelling of the abdomen that is new, acute  Fever of 100F or higher  FOLLOW UP: If any biopsies were taken you will be contacted by phone or by letter within the next 1-3 weeks.  Call your gastroenterologist if  you have not heard about the biopsies in 3 weeks.  Our staff will call the home number listed on your records the next business day following your procedure to check on you and address any questions or concerns that you may have at that time regarding the information given to you following your procedure. This is a courtesy call and so if there is no answer at the home number and we have not heard from you through the emergency physician on call, we will assume that you have returned to your regular daily  activities without incident.  SIGNATURES/CONFIDENTIALITY: You and/or your care partner have signed paperwork which will be entered into your electronic medical record.  These signatures attest to the fact that that the information above on your After Visit Summary has been reviewed and is understood.  Full responsibility of the confidentiality of this discharge information lies with you and/or your care-partner.

## 2013-07-25 NOTE — Progress Notes (Signed)
Called to room to assist during endoscopic procedure.  Patient ID and intended procedure confirmed with present staff. Received instructions for my participation in the procedure from the performing physician.  

## 2013-07-26 ENCOUNTER — Encounter: Payer: Medicare Other | Admitting: Internal Medicine

## 2013-07-26 ENCOUNTER — Encounter: Payer: Self-pay | Admitting: Internal Medicine

## 2013-07-26 ENCOUNTER — Ambulatory Visit (INDEPENDENT_AMBULATORY_CARE_PROVIDER_SITE_OTHER): Payer: Medicare Other | Admitting: Internal Medicine

## 2013-07-26 ENCOUNTER — Telehealth: Payer: Self-pay | Admitting: *Deleted

## 2013-07-26 VITALS — BP 142/76 | HR 72 | Temp 98.1°F | Ht 61.75 in | Wt 170.0 lb

## 2013-07-26 DIAGNOSIS — Z23 Encounter for immunization: Secondary | ICD-10-CM

## 2013-07-26 DIAGNOSIS — Z0000 Encounter for general adult medical examination without abnormal findings: Secondary | ICD-10-CM

## 2013-07-26 DIAGNOSIS — E1159 Type 2 diabetes mellitus with other circulatory complications: Secondary | ICD-10-CM

## 2013-07-26 LAB — POCT URINALYSIS DIPSTICK
Bilirubin, UA: NEGATIVE
Ketones, UA: NEGATIVE
Leukocytes, UA: NEGATIVE
Protein, UA: NEGATIVE
pH, UA: 5.5

## 2013-07-26 LAB — CBC WITH DIFFERENTIAL/PLATELET
Basophils Relative: 0.3 % (ref 0.0–3.0)
Eosinophils Absolute: 0.2 10*3/uL (ref 0.0–0.7)
Eosinophils Relative: 1.6 % (ref 0.0–5.0)
HCT: 41.8 % (ref 36.0–46.0)
Hemoglobin: 14.3 g/dL (ref 12.0–15.0)
Lymphs Abs: 2.9 10*3/uL (ref 0.7–4.0)
MCHC: 34.1 g/dL (ref 30.0–36.0)
Monocytes Absolute: 0.6 10*3/uL (ref 0.1–1.0)
Monocytes Relative: 6 % (ref 3.0–12.0)
Neutro Abs: 6.4 10*3/uL (ref 1.4–7.7)
Platelets: 203 10*3/uL (ref 150.0–400.0)
RBC: 4.36 Mil/uL (ref 3.87–5.11)
WBC: 10.1 10*3/uL (ref 4.5–10.5)

## 2013-07-26 LAB — LIPID PANEL
HDL: 39.1 mg/dL (ref >39.00)
LDL Cholesterol: 38 mg/dL (ref 0–99)
Total CHOL/HDL Ratio: 3
VLDL: 21.6 mg/dL (ref 0.0–40.0)

## 2013-07-26 LAB — BASIC METABOLIC PANEL
CO2: 26 mEq/L (ref 19–32)
Chloride: 105 mEq/L (ref 96–112)
GFR: 132.61 mL/min (ref >60.00)
Glucose, Bld: 137 mg/dL — ABNORMAL HIGH (ref 70–99)
Potassium: 4 mEq/L (ref 3.5–5.1)
Sodium: 137 mEq/L (ref 135–145)

## 2013-07-26 LAB — MICROALBUMIN / CREATININE URINE RATIO: Microalb, Ur: 2.1 mg/dL — ABNORMAL HIGH (ref 0.0–1.9)

## 2013-07-26 LAB — HEPATIC FUNCTION PANEL
Albumin: 4.2 g/dL (ref 3.5–5.2)
Bilirubin, Direct: 0.1 mg/dL (ref 0.0–0.3)

## 2013-07-26 NOTE — Telephone Encounter (Signed)
  Follow up Call-left message to call if questions or concerns.

## 2013-07-26 NOTE — Progress Notes (Signed)
cpx  Past Medical History  Diagnosis Date  . Diabetes mellitus   . Hypertension   . High cholesterol   . Personal history of colonic adenoma 06/12/2008  . Cancer     skin cancer arm and face  . Allergy     seasonal  . Arthritis     fingers  . Thyroid disease     young adult-no meds now    History   Social History  . Marital Status: Married    Spouse Name: N/A    Number of Children: 3  . Years of Education: N/A   Occupational History  . housewife    Social History Main Topics  . Smoking status: Current Every Day Smoker -- 0.50 packs/day for 54 years    Types: Cigarettes  . Smokeless tobacco: Never Used  . Alcohol Use: No  . Drug Use: No  . Sexual Activity: Not on file   Other Topics Concern  . 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 Cholecystectomy    . Tubal ligation    . Bladder tact    . Tonsillectomy and adenoidectomy    . Skin cancer excision    . Colonoscopy    . Cataract extraction  09-07-2012    rt eye    Family History  Problem Relation Age of Onset  . Hypertension Father     family hx  . Diabetes Mother   . Kidney failure Mother   . Heart disease Mother   . Stroke Sister   . Other Sister     Leg Disease  . Colon cancer Neg Hx   . Esophageal cancer Neg Hx   . Rectal cancer Neg Hx   . Stomach cancer Neg Hx     No Known Allergies  Current Outpatient Prescriptions on File Prior to Visit  Medication Sig Dispense Refill  . aspirin 81 MG tablet Take 1 tablet (81 mg total) by mouth every morning. STOP TODAY 11/25, RESTART 08/08/2013  30 tablet    . brimonidine-timolol (COMBIGAN) 0.2-0.5 % ophthalmic solution Place 1 drop into both eyes every 12 (twelve) hours.      . Calcium Carbonate-Vit D-Min (CALCIUM 1200) 1200-1000 MG-UNIT CHEW Chew 1 tablet by mouth daily as  needed. For heartburn      . fluticasone (FLOVENT HFA) 44 MCG/ACT inhaler Inhale 2 puffs into the lungs 2 (two) times daily. For the next 2 weeks  1 Inhaler  0  . fosinopril (MONOPRIL) 40 MG tablet Take 40 mg by mouth every morning.      Marland Kitchen ibuprofen (ADVIL,MOTRIN) 200 MG tablet Take 800 mg by mouth every 6 (six) hours as needed. Pain      . lansoprazole (PREVACID) 30 MG capsule Take 30 mg by mouth every morning.      . metFORMIN (GLUCOPHAGE) 500 MG tablet Take 1 tablet (500 mg total) by mouth daily with breakfast.  90 tablet  3  . Multiple Vitamin (MULTIVITAMIN WITH MINERALS) TABS Take 1 tablet by mouth every morning.      . rosuvastatin (CRESTOR) 10 MG tablet Take 10 mg by mouth every evening. Or as directed      . tobramycin (TOBREX) 0.3 % ophthalmic solution       . [DISCONTINUED]  lansoprazole (PREVACID) 30 MG capsule Take 1 capsule (30 mg total) by mouth daily.  90 capsule  3  . [DISCONTINUED] rosuvastatin (CRESTOR) 10 MG tablet Take 1 tablet (10 mg total) by mouth daily. Or as directed  90 tablet  3   No current facility-administered medications on file prior to visit.     patient denies chest pain, shortness of breath, orthopnea. Denies lower extremity edema, abdominal pain, change in appetite, change in bowel movements. Patient denies rashes, musculoskeletal complaints. No other specific complaints in a complete review of systems.   BP 142/76  Pulse 72  Temp(Src) 98.1 F (36.7 C) (Oral)  Ht 5' 1.75" (1.568 m)  Wt 170 lb (77.111 kg)  BMI 31.36 kg/m2  Well-developed well-nourished female in no acute distress. HEENT exam atraumatic, normocephalic, extraocular muscles are intact. Neck is supple. No jugular venous distention no thyromegaly. Chest clear to auscultation without increased work of breathing. Cardiac exam S1 and S2 are regular. Abdominal exam active bowel sounds, soft, nontender. Extremities no edema. Neurologic exam she is alert without any motor sensory deficits. Gait is  normal.   Well Visit- Health maint UTD Stop smoking

## 2013-08-01 ENCOUNTER — Encounter: Payer: Self-pay | Admitting: Internal Medicine

## 2013-08-01 NOTE — Progress Notes (Signed)
Quick Note:  15 mm and diminutive adenomas Repeat colonoscopy 2017 ______

## 2013-08-15 ENCOUNTER — Other Ambulatory Visit: Payer: Self-pay | Admitting: Internal Medicine

## 2013-09-06 ENCOUNTER — Other Ambulatory Visit: Payer: Self-pay | Admitting: Dermatology

## 2013-09-25 ENCOUNTER — Other Ambulatory Visit: Payer: Self-pay | Admitting: Internal Medicine

## 2013-09-27 ENCOUNTER — Other Ambulatory Visit: Payer: Self-pay | Admitting: Internal Medicine

## 2013-10-24 ENCOUNTER — Ambulatory Visit: Payer: Medicare Other | Admitting: Internal Medicine

## 2013-10-27 ENCOUNTER — Encounter: Payer: Self-pay | Admitting: Internal Medicine

## 2013-10-27 ENCOUNTER — Ambulatory Visit (INDEPENDENT_AMBULATORY_CARE_PROVIDER_SITE_OTHER): Payer: Medicare Other | Admitting: Internal Medicine

## 2013-10-27 VITALS — BP 110/80 | HR 88 | Temp 99.2°F | Wt 173.0 lb

## 2013-10-27 DIAGNOSIS — M25551 Pain in right hip: Secondary | ICD-10-CM

## 2013-10-27 DIAGNOSIS — M25559 Pain in unspecified hip: Secondary | ICD-10-CM

## 2013-10-27 DIAGNOSIS — I10 Essential (primary) hypertension: Secondary | ICD-10-CM

## 2013-10-27 MED ORDER — HYDROCODONE-ACETAMINOPHEN 10-325 MG PO TABS: 1.0000 | ORAL_TABLET | Freq: Three times a day (TID) | ORAL | Status: DC | PRN

## 2013-10-27 MED ORDER — METHYLPREDNISOLONE ACETATE 80 MG/ML IJ SUSP
120.0000 mg | Freq: Once | INTRAMUSCULAR | Status: AC
  Administered 2013-10-27: 120 mg via INTRAMUSCULAR

## 2013-10-27 NOTE — Progress Notes (Signed)
Pre visit review using our clinic review tool, if applicable. No additional management support is needed unless otherwise documented below in the visit note. 

## 2013-10-27 NOTE — Progress Notes (Signed)
Subjective:    Patient ID: ARAH ARO, female    DOB: 1940/12/01, 73 y.o.   MRN: [id number]  HPI  73 year old patient, who presents with a two-week history of right hip and leg discomfort.  Pain is aggravated slightly by walking.  It seems most severe when she attempts to sleep at night; she is unable to comfortably sleep on her left or right side, but is pain-free.  When sleeping in a recliner.  There has been no trauma. Pain is aggravated by hip flexion with radiation to the anterior leg  Past Medical History  Diagnosis Date  . Diabetes mellitus   . Hypertension   . High cholesterol   . Personal history of colonic adenoma 06/12/2008  . Cancer     skin cancer arm and face  . Allergy     seasonal  . Arthritis     fingers  . Thyroid disease     young adult-no meds now    History   Social History  . Marital Status: Married    Spouse Name: N/A    Number of Children: 3  . Years of Education: N/A   Occupational History  . housewife    Social History Main Topics  . Smoking status: Current Every Day Smoker -- 0.50 packs/day for 54 years    Types: Cigarettes  . Smokeless tobacco: Never Used  . Alcohol Use: No  . Drug Use: No  . Sexual Activity: Not on file   Other Topics Concern  . 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 Cholecystectomy    . Tubal ligation    . Bladder tact    . Tonsillectomy and adenoidectomy    . Skin cancer excision    . Colonoscopy    . Cataract extraction  09-07-2012    rt eye  . Cataract extraction Right 08/2012    Family History  Problem Relation Age of Onset  . Hypertension Father     family hx  . Diabetes Mother   . Kidney failure Mother   . Heart disease Mother   . Stroke Sister   . Other Sister     Leg Disease  . Colon cancer Neg Hx   . Esophageal  cancer Neg Hx   . Rectal cancer Neg Hx   . Stomach cancer Neg Hx     No Known Allergies  Current Outpatient Prescriptions on File Prior to Visit  Medication Sig Dispense Refill  . aspirin 81 MG tablet Take 1 tablet (81 mg total) by mouth every morning. STOP TODAY 11/25, RESTART 08/08/2013  30 tablet    . brimonidine-timolol (COMBIGAN) 0.2-0.5 % ophthalmic solution Place 1 drop into both eyes every 12 (twelve) hours.      . Calcium Carbonate-Vit D-Min (CALCIUM 1200) 1200-1000 MG-UNIT CHEW Chew 1 tablet by mouth daily as needed. For heartburn      . fosinopril (MONOPRIL) 40 MG tablet TAKE ONE TABLET BY MOUTH EVERY DAY  90 tablet  1  . lansoprazole (PREVACID) 30 MG capsule Take 30 mg by mouth every morning.      . metFORMIN (GLUCOPHAGE) 500 MG tablet TAKE ONE TABLET BY MOUTH EVERY DAY WITH  BREAKFAST  90 tablet  0  . Multiple Vitamin (MULTIVITAMIN  WITH MINERALS) TABS Take 1 tablet by mouth every morning.      . rosuvastatin (CRESTOR) 10 MG tablet Take 10 mg by mouth every other day. Or as directed      . tobramycin (TOBREX) 0.3 % ophthalmic solution Place 1 drop into the right eye every 4 (four) hours.       . [DISCONTINUED] lansoprazole (PREVACID) 30 MG capsule Take 1 capsule (30 mg total) by mouth daily.  90 capsule  3  . [DISCONTINUED] rosuvastatin (CRESTOR) 10 MG tablet Take 1 tablet (10 mg total) by mouth daily. Or as directed  90 tablet  3   No current facility-administered medications on file prior to visit.    BP 110/80  Pulse 88  Temp(Src) 99.2 F (37.3 C) (Oral)  Wt 173 lb (78.472 kg)  SpO2 93%     Review of Systems  Constitutional: Negative.   HENT: Negative for congestion, dental problem, hearing loss, rhinorrhea, sinus pressure, sore throat and tinnitus.   Eyes: Negative for pain, discharge and visual disturbance.  Respiratory: Negative for cough and shortness of breath.   Cardiovascular: Negative for chest pain, palpitations and leg swelling.  Gastrointestinal:  Negative for nausea, vomiting, abdominal pain, diarrhea, constipation, blood in stool and abdominal distention.  Genitourinary: Negative for dysuria, urgency, frequency, hematuria, flank pain, vaginal bleeding, vaginal discharge, difficulty urinating, vaginal pain and pelvic pain.  Musculoskeletal: Positive for arthralgias and gait problem. Negative for joint swelling.  Skin: Negative for rash.  Neurological: Negative for dizziness, syncope, speech difficulty, weakness, numbness and headaches.  Hematological: Negative for adenopathy.  Psychiatric/Behavioral: Negative for behavioral problems, dysphoric mood and agitation. The patient is not nervous/anxious.        Objective:   Physical Exam  Constitutional: She appears well-developed and well-nourished. No distress.  Musculoskeletal:  Pain and decreased range of motion right hip No obvious abnormalities involving the right knee          Assessment & Plan:   Right hip pain.  Will treat with Depo-Medrol short-term leave and observe.  If not improved early next week.  Will obtain a radiograph Diabetes mellitus Hypertension

## 2013-10-27 NOTE — Patient Instructions (Signed)
X-ray right hip as discussed   Take Aleve 200 mg twice daily for pain or swelling  Call or return to clinic prn if these symptoms worsen or fail to improve as anticipated.

## 2013-10-30 ENCOUNTER — Telehealth: Payer: Self-pay | Admitting: Internal Medicine

## 2013-10-30 ENCOUNTER — Ambulatory Visit (INDEPENDENT_AMBULATORY_CARE_PROVIDER_SITE_OTHER)
Admission: RE | Admit: 2013-10-30 | Discharge: 2013-10-30 | Disposition: A | Discharge status: Home / Self Care | Payer: Medicare Other | Source: Ambulatory Visit | Attending: Internal Medicine | Admitting: Internal Medicine

## 2013-10-30 DIAGNOSIS — M25559 Pain in unspecified hip: Secondary | ICD-10-CM

## 2013-10-30 DIAGNOSIS — M25551 Pain in right hip: Secondary | ICD-10-CM

## 2013-10-30 NOTE — Telephone Encounter (Signed)
Relevant patient education mailed to patient.  

## 2013-11-02 ENCOUNTER — Telehealth: Payer: Self-pay | Admitting: *Deleted

## 2013-11-02 NOTE — Telephone Encounter (Signed)
Spoke to pt told her Right Hip x-ray showed no evidence of hip fracture or dislocation, just Osteoarthritic changes. Pt verbalized understanding. Pt said what else can I take for pain. Asked pt if taking Hydrocodone? Pt said yes every now and then and takes Aleve in the morning. Told pt she can take Aleve twice a day. Pt verbalized understanding. Told pt Dr.K is out of the office till March 17th if any other recommendations I will get back to you. Pt verbalized understanding.

## 2013-11-15 ENCOUNTER — Telehealth: Payer: Self-pay | Admitting: Internal Medicine

## 2013-11-15 DIAGNOSIS — M25551 Pain in right hip: Secondary | ICD-10-CM

## 2013-11-15 NOTE — Telephone Encounter (Signed)
Pt would like to hear about xray done 3/2 on hip. Pt saw dr Raliegh Ip for this issue. Pt still in pain.

## 2013-11-16 NOTE — Telephone Encounter (Signed)
Pt waiting to hear about xray.

## 2013-11-16 NOTE — Telephone Encounter (Signed)
Notify patient that the x-ray reveals significant arthritic changes.  Please offer patient.  Orthopedic referral if desires

## 2013-11-17 NOTE — Telephone Encounter (Signed)
Spoke to pt told her x-ray showed arthritic changes and can send you to Ortho if you like per Dr.K. Pt said yes I am still having pain in my hip. Told her okay will send referral and someone will be contacting you for an appointment. Pt verbalized understanding.

## 2014-02-08 ENCOUNTER — Other Ambulatory Visit: Payer: Self-pay | Admitting: Internal Medicine

## 2014-02-24 ENCOUNTER — Emergency Department (HOSPITAL_COMMUNITY)
Admission: EM | Admit: 2014-02-24 | Discharge: 2014-02-24 | Disposition: A | Discharge status: Home / Self Care | Payer: Medicare Other | Attending: Emergency Medicine | Admitting: Emergency Medicine

## 2014-02-24 ENCOUNTER — Encounter (HOSPITAL_COMMUNITY): Payer: Self-pay | Admitting: Emergency Medicine

## 2014-02-24 ENCOUNTER — Telehealth: Payer: Self-pay

## 2014-02-24 ENCOUNTER — Emergency Department (HOSPITAL_COMMUNITY): Payer: Medicare Other

## 2014-02-24 DIAGNOSIS — Z860100 Personal history of colon polyps, unspecified: Secondary | ICD-10-CM | POA: Insufficient documentation

## 2014-02-24 DIAGNOSIS — Z85828 Personal history of other malignant neoplasm of skin: Secondary | ICD-10-CM | POA: Insufficient documentation

## 2014-02-24 DIAGNOSIS — M19049 Primary osteoarthritis, unspecified hand: Secondary | ICD-10-CM | POA: Insufficient documentation

## 2014-02-24 DIAGNOSIS — R059 Cough, unspecified: Secondary | ICD-10-CM | POA: Insufficient documentation

## 2014-02-24 DIAGNOSIS — Z8709 Personal history of other diseases of the respiratory system: Secondary | ICD-10-CM | POA: Insufficient documentation

## 2014-02-24 DIAGNOSIS — F17200 Nicotine dependence, unspecified, uncomplicated: Secondary | ICD-10-CM | POA: Insufficient documentation

## 2014-02-24 DIAGNOSIS — M5416 Radiculopathy, lumbar region: Secondary | ICD-10-CM

## 2014-02-24 DIAGNOSIS — E7800 Pure hypercholesterolemia, unspecified: Secondary | ICD-10-CM | POA: Insufficient documentation

## 2014-02-24 DIAGNOSIS — IMO0002 Reserved for concepts with insufficient information to code with codable children: Secondary | ICD-10-CM | POA: Insufficient documentation

## 2014-02-24 DIAGNOSIS — Z79899 Other long term (current) drug therapy: Secondary | ICD-10-CM | POA: Insufficient documentation

## 2014-02-24 DIAGNOSIS — Z7982 Long term (current) use of aspirin: Secondary | ICD-10-CM | POA: Insufficient documentation

## 2014-02-24 DIAGNOSIS — E119 Type 2 diabetes mellitus without complications: Secondary | ICD-10-CM | POA: Insufficient documentation

## 2014-02-24 DIAGNOSIS — I10 Essential (primary) hypertension: Secondary | ICD-10-CM | POA: Insufficient documentation

## 2014-02-24 DIAGNOSIS — R05 Cough: Secondary | ICD-10-CM | POA: Insufficient documentation

## 2014-02-24 HISTORY — DX: Bursopathy, unspecified: M71.9

## 2014-02-24 MED ORDER — OXYCODONE-ACETAMINOPHEN 5-325 MG PO TABS
1.0000 | ORAL_TABLET | Freq: Once | ORAL | Status: AC
  Administered 2014-02-24: 1 via ORAL
  Filled 2014-02-24: qty 1

## 2014-02-24 MED ORDER — POLYETHYLENE GLYCOL 3350 17 GM/SCOOP PO POWD: 17.0000 g | Freq: Every day | ORAL | Status: DC

## 2014-02-24 MED ORDER — OXYCODONE-ACETAMINOPHEN 5-325 MG PO TABS: 1.0000 | ORAL_TABLET | ORAL | Status: DC | PRN

## 2014-02-24 NOTE — ED Notes (Signed)
Back from xray, alert, NAD, calm, interactive.

## 2014-02-24 NOTE — Discharge Instructions (Signed)
Your x-rays do not show any emergent causes for your back pain.  There were no signs of pneumonia or other concerning cause of your cough at this time.  Please use the medications prescribed to help with symptoms.  Follow up with your doctor for continued evaluation and treatment.    Lumbosacral Radiculopathy Lumbosacral radiculopathy is a pinched nerve or nerves in the low back (lumbosacral area). When this happens you may have weakness in your legs and may not be able to stand on your toes. You may have pain going down into your legs. There may be difficulties with walking normally. There are many causes of this problem. Sometimes this may happen from an injury, or simply from arthritis or boney problems. It may also be caused by other illnesses such as diabetes. If there is no improvement after treatment, further studies may be done to find the exact cause. DIAGNOSIS  X-rays may be needed if the problems become long standing. Electromyograms may be done. This study is one in which the working of nerves and muscles is studied. HOME CARE INSTRUCTIONS   Applications of ice packs may be helpful. Ice can be used in a plastic bag with a towel around it to prevent frostbite to skin. This may be used every 2 hours for 20 to 30 minutes, or as needed, while awake, or as directed by your caregiver.  Only take over-the-counter or prescription medicines for pain, discomfort, or fever as directed by your caregiver.  If physical therapy was prescribed, follow your caregiver's directions. SEEK IMMEDIATE MEDICAL CARE IF:   You have pain not controlled with medications.  You seem to be getting worse rather than better.  You develop increasing weakness in your legs.  You develop loss of bowel or bladder control.  You have difficulty with walking or balance, or develop clumsiness in the use of your legs.  You have a fever. MAKE SURE YOU:   Understand these instructions.  Will watch your  condition.  Will get help right away if you are not doing well or get worse. Document Released: 08/17/2005 Document Revised: 11/09/2011 Document Reviewed: 04/06/2008 [facility] Patient Information 2015 [place], [place]. This information is not intended to replace advice given to you by your health care provider. Make sure you discuss any questions you have with your health care provider.    Cough, Adult  A cough is a reflex that helps clear your throat and airways. It can help heal the body or may be a reaction to an irritated airway. A cough may only last 2 or 3 weeks (acute) or may last more than 8 weeks (chronic).  CAUSES Acute cough:  Viral or bacterial infections. Chronic cough:  Infections.  Allergies.  Asthma.  Post-nasal drip.  Smoking.  Heartburn or acid reflux.  Some medicines.  Chronic lung problems (COPD).  Cancer. SYMPTOMS   Cough.  Fever.  Chest pain.  Increased breathing rate.  High-pitched whistling sound when breathing (wheezing).  Colored mucus that you cough up (sputum). TREATMENT   A bacterial cough may be treated with antibiotic medicine.  A viral cough must run its course and will not respond to antibiotics.  Your caregiver may recommend other treatments if you have a chronic cough. HOME CARE INSTRUCTIONS   Only take over-the-counter or prescription medicines for pain, discomfort, or fever as directed by your caregiver. Use cough suppressants only as directed by your caregiver.  Use a cold steam vaporizer or humidifier in your bedroom or home to help  loosen secretions.  Sleep in a semi-upright position if your cough is worse at night.  Rest as needed.  Stop smoking if you smoke. SEEK IMMEDIATE MEDICAL CARE IF:   You have pus in your sputum.  Your cough starts to worsen.  You cannot control your cough with suppressants and are losing sleep.  You begin coughing up blood.  You have difficulty breathing.  You develop pain  which is getting worse or is uncontrolled with medicine.  You have a fever. MAKE SURE YOU:   Understand these instructions.  Will watch your condition.  Will get help right away if you are not doing well or get worse. Document Released: 02/13/2011 Document Revised: 11/09/2011 Document Reviewed: 02/13/2011 [facility] Patient Information 2015 [place], [place]. This information is not intended to replace advice given to you by your health care provider. Make sure you discuss any questions you have with your health care provider.

## 2014-02-24 NOTE — ED Notes (Signed)
Pt alert, NAD, calm, interactive. H/o hip bursitis. C/o describes sciatica type pain, no h/o same, pinpoints pain to R buttocks, with some minimal radiation down posterior thigh, (denies: loss of control of bowel or bladder, nvd, fever, abd or back pain, bleeding, foot drop weakness or tripping). "feel better after percocet given on arrival", pt was unable to find position of comfort PTA, "worse when sitting", husband at [facility].

## 2014-02-24 NOTE — ED Notes (Signed)
Sleeping, VSS, NAD, calm.

## 2014-02-24 NOTE — ED Notes (Signed)
Denies pain, sx, questions or concerns unmet.

## 2014-02-24 NOTE — ED Provider Notes (Signed)
CSN: [id number]     Arrival date & time 02/24/14  0047 History   First MD Initiated Contact with Patient 02/24/14 [phone]     Chief Complaint  Patient presents with  . Pain   HPI  History the patient. The patient is a 73 year old female with history of hypertension, hypercholesterolemia, diabetes and osteoporosis who presents with complaints of low back pain radiating to the right buttocks and posterior right thigh. Symptoms began yesterday and are described as very severe. She has difficulty even trying to sit down to use the bathroom. The pain was worse with any movements or walking. She denies having any similar symptoms previously. Denies any specific trauma or strenuous activity he however does report doing a lot of different sitting and bending yesterday as she and her husband were shopping for new cars. She did take one hydrocodone that she had left from old prescription at home but this did not help significantly. She denies having any urinary changes. No dysuria, hematuria urinary frequency. No weakness or numbness in lower extremities. No urinary or fecal incontinence, urinary retention or perineal numbness. No recent fever, chills or sweats.  She does also report occasional increased congestive cough intermittently productive of white phlegm. No hemoptysis.      Past Medical History  Diagnosis Date  . Diabetes mellitus   . Hypertension   . High cholesterol   . Personal history of colonic adenoma 06/12/2008  . Cancer     skin cancer arm and face  . Allergy     seasonal  . Arthritis     fingers  . Thyroid disease     young adult-no meds now  . Bursitis    Past Surgical History  Procedure Laterality Date  . Cholecystectomy    . Tubal ligation    . Bladder tact    . Tonsillectomy and adenoidectomy    . Skin cancer excision    . Colonoscopy    . Cataract extraction  09-07-2012    rt eye  . Cataract extraction Right 08/2012   Family History  Problem Relation Age of Onset   . Hypertension Father     family hx  . Diabetes Mother   . Kidney failure Mother   . Heart disease Mother   . Stroke Sister   . Other Sister     Leg Disease  . Colon cancer Neg Hx   . Esophageal cancer Neg Hx   . Rectal cancer Neg Hx   . Stomach cancer Neg Hx    History  Substance Use Topics  . Smoking status: Current Every Day Smoker -- 0.50 packs/day for 54 years    Types: Cigarettes  . Smokeless tobacco: Never Used  . Alcohol Use: No   OB History   Grav Para Term Preterm Abortions TAB SAB Ect Mult Living                 Review of Systems  Constitutional: Negative for fever, chills and diaphoresis.  Respiratory: Positive for cough. Negative for shortness of breath and wheezing.   Gastrointestinal: Negative for nausea, vomiting, abdominal pain and diarrhea.  Musculoskeletal: Positive for back pain.      Allergies  Review of patient's allergies indicates no known allergies.  Home Medications   Prior to Admission medications   Medication Sig Start Date End Date Taking? Authorizing Jhane Lorio  aspirin 81 MG tablet Take 1 tablet (81 mg total) by mouth every morning. STOP TODAY 11/25, RESTART 08/08/2013 07/25/13   Glendell Docker  Simonne Maffucci, MD  brimonidine-timolol (COMBIGAN) 0.2-0.5 % ophthalmic solution Place 1 drop into both eyes every 12 (twelve) hours.    Historical Shariece Viveiros, MD  Calcium Carbonate-Vit D-Min (CALCIUM 1200) 1200-1000 MG-UNIT CHEW Chew 1 tablet by mouth daily as needed. For heartburn    Historical Tyresa Prindiville, MD  fosinopril (MONOPRIL) 40 MG tablet TAKE ONE TABLET BY MOUTH EVERY DAY 09/27/13   Lisabeth Pick, MD  HYDROcodone-acetaminophen (NORCO) 10-325 MG per tablet Take 1 tablet by mouth every 8 (eight) hours as needed. 10/27/13   Marletta Lor, MD  lansoprazole (PREVACID) 30 MG capsule Take 30 mg by mouth every morning. 07/15/12 07/15/14  Lisabeth Pick, MD  metFORMIN (GLUCOPHAGE) 500 MG tablet TAKE 1 TAB EVERY DAY WITH BREAKFAST 02/08/14   Lisabeth Pick, MD   Multiple Vitamin (MULTIVITAMIN WITH MINERALS) TABS Take 1 tablet by mouth every morning.    Historical Elexis Pollak, MD  rosuvastatin (CRESTOR) 10 MG tablet Take 10 mg by mouth every other day. Or as directed 07/15/12 07/15/14  Lisabeth Pick, MD  tobramycin (TOBREX) 0.3 % ophthalmic solution Place 1 drop into the right eye every 4 (four) hours.  07/18/13   Historical Nahiem Dredge, MD   BP 138/65  Pulse 87  Temp(Src) 97.4 F (36.3 C) (Oral)  Resp 20  Wt 170 lb (77.111 kg)  SpO2 98% Physical Exam  Nursing note and vitals reviewed. Constitutional: She is oriented to person, place, and time. She appears well-developed and well-nourished. No distress.  HENT:  Head: Normocephalic.  Cardiovascular: Normal rate and regular rhythm.   Pulmonary/Chest: Effort normal. She has rales.  Very slight rales left lung. Occasional congested cough.  Abdominal: Soft. There is no tenderness. There is no rebound and no guarding.  Musculoskeletal: Normal range of motion.  Tenderness around the right lower back and paralumbar spinous area. No deformities, swelling or significant muscle spasm.  Neurological: She is alert and oriented to person, place, and time. She has normal strength. No sensory deficit.  Skin: Skin is warm and dry. No rash noted.  Psychiatric: She has a normal mood and affect. Her behavior is normal.     ED Course  Procedures  COORDINATION OF CARE:  Nursing notes reviewed. Vital signs reviewed. Initial pt interview and examination performed.   Filed Vitals:   02/24/14 0245 02/24/14 0300 02/24/14 0315 02/24/14 0326  BP: 165/63 167/66 138/65 138/65  Pulse: 79 78 75 87  Temp:      TempSrc:      Resp: 14 13 13 20   Weight:      SpO2: 90% 94% 95% 98%    3:28 AM-patient seen and evaluated. She is well-appearing sleeping initially and awakes easily. Does not appear in any severe pain or discomfort. Does report having significant relief after receiving Percocet in the emergency room. She is  moving much better without severe pain still low back. Continues have mild tenderness. Other than her age she has no other concerning a red flag symptoms for her back pain. Will order plain film x-rays to rule out any concerning cause. She also has had some slight coughing recently with slight congested sounding cough and slight Rales to auscultation. We'll get chest x-ray.  Chest x-ray without signs of pneumonia. Patient is afebrile with only slight cough. I have given her strict return cautions for any worsening cough, fever or shortness of breath. At this time no indications for antibiotics. She is well-appearing.  X-rays of the lumbar spine do show degenerative disc disease  and spurring throughout. No concerning acute findings back pain. She is much better now at this time feel she may return home with symptomatic treatment. She agrees with this plan.   Treatment plan initiated: Medications  oxyCODONE-acetaminophen (PERCOCET/ROXICET) 5-325 MG per tablet 1 tablet (1 tablet Oral Given 02/24/14 0107)      Imaging Review Dg Chest 2 View  02/24/2014   CLINICAL DATA:  PAIN PAIN  EXAM: CHEST  2 VIEW  COMPARISON:  Prior radiograph from 04/14/2007.  FINDINGS: Examination is limited by technique. The cardiac and mediastinal silhouettes are stable in size and contour, and remain within normal limits.  The lungs are normally inflated. No airspace consolidation, pleural effusion, or pulmonary edema is identified. There is no pneumothorax.  No acute osseous abnormality identified.  IMPRESSION: Somewhat limited study due to technique. No definite acute cardiopulmonary abnormality.   Electronically Signed   By: Jeannine Boga M.D.   On: 02/24/2014 04:43   Dg Lumbar Spine Complete  02/24/2014   CLINICAL DATA:  PAIN PAIN  EXAM: LUMBAR SPINE - COMPLETE 4+ VIEW  COMPARISON:  None.  FINDINGS: Five non rib-bearing lumbar type vertebral bodies are present. Vertebral bodies are normally aligned with preservation  of the normal lumbar lordosis. Vertebral body heights are preserved. No acute fracture listhesis.  Moderate multilevel degenerative disc disease is evidenced by intervertebral disc space narrowing, endplate sclerosis, and osteophytosis is seen throughout the lumbar spine, most severe at L5-S1. Prominent bilateral facet arthrosis present at L5-S1.  No paraspinous soft tissue abnormality. Atherosclerotic calcifications present within the intra-abdominal aorta.  IMPRESSION: 1. No acute fracture or listhesis. 2. Moderate multilevel degenerative disc disease and facet arthrosis, most prevalent at L5-S1.   Electronically Signed   By: Jeannine Boga M.D.   On: 02/24/2014 04:46     MDM   Final diagnoses:  Lumbar radicular pain  Cough        Martie Lee, PA-C 02/24/14 2056

## 2014-02-24 NOTE — ED Notes (Signed)
ED PA at BS 

## 2014-02-24 NOTE — Telephone Encounter (Signed)
[facility] Triage Call Report Triage Record Num: [id number] Operator: Flonnie Hailstone Patient Name: Tracy Hammond Call Date & Time: 02/23/2014 11:54:39PM Patient Phone: [phone] PCP: Darrick Penna. Swords Patient Gender: Female PCP Fax : [phone] Patient DOB: [birth year] Practice Name: [facility] Reason for Call: Caller: Angela/Sibling; PCP: Phoebe Hammond (Adults only, leaving end of July 2015); CB#: [phone]; Daughter calling and states patient has pain in left leg from hip down leg on 6-26. Has taken Hydrocodone but has not helped. Denies injuries.Is crying with pain. Per Leg Non Injury protocol, advised ED due to unbearable pain. Protocol(s) Used: Leg Non-Injury Recommended Outcome per Protocol: See ED Immediately Reason for Outcome: Unbearable pain Care Advice: ~

## 2014-02-24 NOTE — ED Notes (Signed)
Family at [facility]. Pt to xray, alert, NAD, calm, interactive.

## 2014-02-24 NOTE — ED Notes (Signed)
Pt. reports pain at right buttocks radiating down to right leg onset this afternoon , denies injury or fall , pain unrelieved by prescription Hydrocodone .

## 2014-02-25 NOTE — ED Provider Notes (Signed)
Medical screening examination/treatment/procedure(s) were performed by non-physician practitioner and as supervising physician I was immediately available for consultation/collaboration.   EKG Interpretation None       Jasper Riling. Alvino Chapel, MD 02/25/14 [phone]

## 2014-02-26 NOTE — Telephone Encounter (Signed)
Pt went to ED

## 2014-03-19 ENCOUNTER — Other Ambulatory Visit: Payer: Self-pay | Admitting: Internal Medicine

## 2014-05-08 ENCOUNTER — Other Ambulatory Visit: Payer: Self-pay | Admitting: Internal Medicine

## 2014-06-19 ENCOUNTER — Other Ambulatory Visit: Payer: Self-pay | Admitting: Internal Medicine

## 2014-07-18 ENCOUNTER — Ambulatory Visit: Payer: Medicare Other | Admitting: Internal Medicine

## 2014-07-30 ENCOUNTER — Ambulatory Visit: Payer: Medicare Other | Admitting: Internal Medicine

## 2014-07-31 ENCOUNTER — Ambulatory Visit: Payer: Medicare Other

## 2014-07-31 ENCOUNTER — Ambulatory Visit (INDEPENDENT_AMBULATORY_CARE_PROVIDER_SITE_OTHER): Payer: Medicare Other

## 2014-07-31 DIAGNOSIS — Z23 Encounter for immunization: Secondary | ICD-10-CM

## 2014-08-09 ENCOUNTER — Other Ambulatory Visit: Payer: Self-pay | Admitting: Internal Medicine

## 2014-08-22 ENCOUNTER — Other Ambulatory Visit: Payer: Self-pay | Admitting: Geriatric Medicine

## 2014-08-22 MED ORDER — ROPINIROLE HCL 1 MG PO TABS: 1.0000 mg | ORAL_TABLET | Freq: Every day | ORAL | Status: DC

## 2014-08-28 ENCOUNTER — Encounter: Payer: Self-pay | Admitting: Internal Medicine

## 2014-08-28 ENCOUNTER — Ambulatory Visit (INDEPENDENT_AMBULATORY_CARE_PROVIDER_SITE_OTHER): Payer: Medicare Other | Admitting: Internal Medicine

## 2014-08-28 ENCOUNTER — Other Ambulatory Visit (INDEPENDENT_AMBULATORY_CARE_PROVIDER_SITE_OTHER): Payer: Medicare Other

## 2014-08-28 VITALS — BP 136/72 | HR 69 | Temp 97.6°F | Resp 12 | Ht 61.0 in | Wt 172.8 lb

## 2014-08-28 DIAGNOSIS — E119 Type 2 diabetes mellitus without complications: Secondary | ICD-10-CM

## 2014-08-28 DIAGNOSIS — Z720 Tobacco use: Secondary | ICD-10-CM

## 2014-08-28 DIAGNOSIS — F17200 Nicotine dependence, unspecified, uncomplicated: Secondary | ICD-10-CM

## 2014-08-28 DIAGNOSIS — G2581 Restless legs syndrome: Secondary | ICD-10-CM

## 2014-08-28 DIAGNOSIS — I10 Essential (primary) hypertension: Secondary | ICD-10-CM

## 2014-08-28 LAB — LIPID PANEL
CHOL/HDL RATIO: 4
CHOLESTEROL: 165 mg/dL (ref 0–200)
HDL: 43.6 mg/dL (ref >39.00)
NONHDL: 121.4
TRIGLYCERIDES: 264 mg/dL — AB (ref 0.0–149.0)
VLDL: 52.8 mg/dL — AB (ref 0.0–40.0)

## 2014-08-28 LAB — FERRITIN: Ferritin: 38.8 ng/mL (ref 10.0–291.0)

## 2014-08-28 LAB — HEMOGLOBIN A1C: Hgb A1c MFr Bld: 7.6 % — ABNORMAL HIGH (ref 4.6–6.5)

## 2014-08-28 LAB — LDL CHOLESTEROL, DIRECT: Direct LDL: 87.7 mg/dL

## 2014-08-28 MED ORDER — ROPINIROLE HCL 2 MG PO TABS: 2.0000 mg | ORAL_TABLET | Freq: Every day | ORAL | Status: DC

## 2014-08-28 NOTE — Patient Instructions (Signed)
We will increase your dose of your restless legs medicine. Until yours is gone take 2 pills at night time. When you get a refill make sure it is the new dose. The new dose will be 2 mg about 30-45 minutes before going to bed.  We will see you back in about 6 months and will check your blood work today. We will likely send in a new cholesterol medication.   Smoking Cessation, Tips for Success If you are ready to quit smoking, congratulations! You have chosen to help yourself be healthier. Cigarettes bring nicotine, tar, carbon monoxide, and other irritants into your body. Your lungs, heart, and blood vessels will be able to work better without these poisons. There are many different ways to quit smoking. Nicotine gum, nicotine patches, a nicotine inhaler, or nicotine nasal spray can help with physical craving. Hypnosis, support groups, and medicines help break the habit of smoking. WHAT THINGS CAN I DO TO MAKE QUITTING EASIER?  Here are some tips to help you quit for good:  Pick a date when you will quit smoking completely. Tell all of your friends and family about your plan to quit on that date.  Do not try to slowly cut down on the number of cigarettes you are smoking. Pick a quit date and quit smoking completely starting on that day.  Throw away all cigarettes.   Clean and remove all ashtrays from your home, work, and car.  On a card, write down your reasons for quitting. Carry the card with you and read it when you get the urge to smoke.  Cleanse your body of nicotine. Drink enough water and fluids to keep your urine clear or pale yellow. Do this after quitting to flush the nicotine from your body.  Learn to predict your moods. Do not let a bad situation be your excuse to have a cigarette. Some situations in your life might tempt you into wanting a cigarette.  Never have "just one" cigarette. It leads to wanting another and another. Remind yourself of your decision to quit.  Change  habits associated with smoking. If you smoked while driving or when feeling stressed, try other activities to replace smoking. Stand up when drinking your coffee. Brush your teeth after eating. Sit in a different chair when you read the paper. Avoid alcohol while trying to quit, and try to drink fewer caffeinated beverages. Alcohol and caffeine may urge you to smoke.  Avoid foods and drinks that can trigger a desire to smoke, such as sugary or spicy foods and alcohol.  Ask people who smoke not to smoke around you.  Have something planned to do right after eating or having a cup of coffee. For example, plan to take a walk or exercise.  Try a relaxation exercise to calm you down and decrease your stress. Remember, you may be tense and nervous for the first 2 weeks after you quit, but this will pass.  Find new activities to keep your hands busy. Play with a pen, coin, or rubber band. Doodle or draw things on paper.  Brush your teeth right after eating. This will help cut down on the craving for the taste of tobacco after meals. You can also try mouthwash.   Use oral substitutes in place of cigarettes. Try using lemon drops, carrots, cinnamon sticks, or chewing gum. Keep them handy so they are available when you have the urge to smoke.  When you have the urge to smoke, try deep breathing.  Designate  your home as a nonsmoking area.  If you are a heavy smoker, ask your health care provider about a prescription for nicotine chewing gum. It can ease your withdrawal from nicotine.  Reward yourself. Set aside the cigarette money you save and buy yourself something nice.  Look for support from others. Join a support group or smoking cessation program. Ask someone at home or at work to help you with your plan to quit smoking.  Always ask yourself, "Do I need this cigarette or is this just a reflex?" Tell yourself, "Today, I choose not to smoke," or "I do not want to smoke." You are reminding yourself  of your decision to quit.  Do not replace cigarette smoking with electronic cigarettes (commonly called e-cigarettes). The safety of e-cigarettes is unknown, and some may contain harmful chemicals.  If you relapse, do not give up! Plan ahead and think about what you will do the next time you get the urge to smoke. HOW WILL I FEEL WHEN I QUIT SMOKING? You may have symptoms of withdrawal because your body is used to nicotine (the addictive substance in cigarettes). You may crave cigarettes, be irritable, feel very hungry, cough often, get headaches, or have difficulty concentrating. The withdrawal symptoms are only temporary. They are strongest when you first quit but will go away within 10-14 days. When withdrawal symptoms occur, stay in control. Think about your reasons for quitting. Remind yourself that these are signs that your body is healing and getting used to being without cigarettes. Remember that withdrawal symptoms are easier to treat than the major diseases that smoking can cause.  Even after the withdrawal is over, expect periodic urges to smoke. However, these cravings are generally short lived and will go away whether you smoke or not. Do not smoke! WHAT RESOURCES ARE AVAILABLE TO HELP ME QUIT SMOKING? Your health care provider can direct you to community resources or hospitals for support, which may include:  Group support.  Education.  Hypnosis.  Therapy. Document Released: 05/15/2004 Document Revised: 01/01/2014 Document Reviewed: 02/02/2013 [facility] Patient Information 2015 [place], [place]. This information is not intended to replace advice given to you by your health care provider. Make sure you discuss any questions you have with your health care provider.

## 2014-08-28 NOTE — Progress Notes (Signed)
Pre visit review using our clinic review tool, if applicable. No additional management support is needed unless otherwise documented below in the visit note. 

## 2014-08-28 NOTE — Assessment & Plan Note (Signed)
Increase requip to 2 mg at night time. Check ferritin as this is linked with iron deficiency.

## 2014-08-28 NOTE — Assessment & Plan Note (Signed)
BP controlled with ACE-I.

## 2014-08-28 NOTE — Progress Notes (Signed)
   Subjective:    Patient ID: Tracy Hammond, female    DOB: [birth year], 73 y.o.   MRN: [id number]  HPI The patient is a 73 YO female who comes in today to establish care. She has PMH of cigarette smoking, DM type 2, HTN, hyperlipidemia. She is having more problems with her restless leg and is having difficulty sleeping due to it. She is also not taking her cholesterol medication for the last month as it is too expensive. She is still smoking about 1/2 PPD and tried to quit before but never did due to stressful family events. She has thought about it. She denies any problems with hypo or hyperglycemia.   Review of Systems  Constitutional: Negative for fever, chills, activity change, appetite change and fatigue.  Respiratory: Negative for cough, chest tightness, shortness of breath and wheezing.   Cardiovascular: Negative for chest pain, palpitations and leg swelling.  Gastrointestinal: Negative for nausea, abdominal pain, diarrhea, constipation and abdominal distention.  Musculoskeletal: Positive for arthralgias. Negative for myalgias, back pain and gait problem.       Hip pain  Skin: Negative.   Neurological: Negative for dizziness, syncope, weakness and light-headedness.      Objective:   Physical Exam  Constitutional: She is oriented to person, place, and time. She appears well-developed and well-nourished. No distress.  HENT:  Head: Normocephalic and atraumatic.  Eyes: EOM are normal.  Neck: Normal range of motion.  Cardiovascular: Normal rate and regular rhythm.   Pulmonary/Chest: Effort normal and breath sounds normal. No respiratory distress. She has no wheezes. She has no rales.  Abdominal: Soft. Bowel sounds are normal. She exhibits no distension. There is no tenderness. There is no rebound.  Neurological: She is alert and oriented to person, place, and time. Coordination normal.  Skin: Skin is warm and dry.   Filed Vitals:   08/28/14 0802  BP: 136/72  Pulse: 69  Temp: 97.6  F (36.4 C)  TempSrc: Oral  Resp: 12  Height: 5\' 1"  (1.549 m)  Weight: 172 lb 12.8 oz (78.382 kg)  SpO2: 94%      Assessment & Plan:

## 2014-08-28 NOTE — Assessment & Plan Note (Signed)
Spoke with her about the need to quit and the risks and harms from continued smoking. Given strategies for success.

## 2014-08-28 NOTE — Assessment & Plan Note (Addendum)
Foot exam done, she has regular eye exam due to her macular degeneration. Check HgA1c. Currently taking metformin 500 mg daily. Will adjust medication as needed. On ACE-I.

## 2014-09-19 ENCOUNTER — Telehealth: Payer: Self-pay | Admitting: Internal Medicine

## 2014-09-19 NOTE — Telephone Encounter (Signed)
Patient was never called regarding labs from 12/29 visit. She is inquiring as to the results. Please contact patient to discuss

## 2014-09-19 NOTE — Telephone Encounter (Signed)
Dr. Doug Sou, I did not see any comments on this patient's lab results. Patient called for results. I didn't want to discuss without your commentary. Please advise, thanks.

## 2014-09-20 ENCOUNTER — Telehealth: Payer: Self-pay | Admitting: *Deleted

## 2014-09-20 NOTE — Telephone Encounter (Signed)
Notified pt with md response.../lmb 

## 2014-09-20 NOTE — Telephone Encounter (Signed)
Please call and let her know that her diabetes is still at goal and her cholesterol looks good. She does not have signs of iron deficiency.

## 2014-09-20 NOTE — Telephone Encounter (Signed)
Her cholesterol was actually at goal so she does not appear to need the cholesterol medication at this time.

## 2014-09-20 NOTE — Telephone Encounter (Signed)
Called pt concerning her lab results. Pt is wanting to know if md want her to take any cholesterol meds. She stated she can't afford the crestor & haven't taken in 2-3 months. pls advise...Johny Chess

## 2014-09-24 ENCOUNTER — Other Ambulatory Visit: Payer: Self-pay | Admitting: Internal Medicine

## 2014-09-24 NOTE — Telephone Encounter (Signed)
Notified pt with md response.../lmb 

## 2014-11-01 ENCOUNTER — Other Ambulatory Visit: Payer: Self-pay | Admitting: Internal Medicine

## 2014-11-02 ENCOUNTER — Telehealth: Payer: Self-pay | Admitting: Internal Medicine

## 2014-11-02 ENCOUNTER — Other Ambulatory Visit: Payer: Self-pay | Admitting: Geriatric Medicine

## 2014-11-02 MED ORDER — METFORMIN HCL 500 MG PO TABS: ORAL_TABLET | ORAL | Status: DC

## 2014-11-02 NOTE — Telephone Encounter (Signed)
RX refill for metformin was sent in yesterday under dr Burnice Logan. He has not seen her since she established with dr Doug Sou on 08/29/2015. This should have come to dr. Doug Sou. Please have RX filled here.

## 2014-11-05 ENCOUNTER — Other Ambulatory Visit: Payer: Self-pay | Admitting: Geriatric Medicine

## 2015-03-05 ENCOUNTER — Other Ambulatory Visit: Payer: Self-pay | Admitting: Internal Medicine

## 2015-03-15 ENCOUNTER — Encounter: Payer: Self-pay | Admitting: Internal Medicine

## 2015-03-15 ENCOUNTER — Other Ambulatory Visit (INDEPENDENT_AMBULATORY_CARE_PROVIDER_SITE_OTHER): Payer: Medicare Other

## 2015-03-15 ENCOUNTER — Ambulatory Visit (INDEPENDENT_AMBULATORY_CARE_PROVIDER_SITE_OTHER): Payer: Medicare Other | Admitting: Internal Medicine

## 2015-03-15 VITALS — BP 140/70 | HR 83 | Temp 98.1°F | Resp 16 | Ht 62.0 in | Wt 177.1 lb

## 2015-03-15 DIAGNOSIS — E119 Type 2 diabetes mellitus without complications: Secondary | ICD-10-CM

## 2015-03-15 DIAGNOSIS — R197 Diarrhea, unspecified: Secondary | ICD-10-CM | POA: Diagnosis not present

## 2015-03-15 DIAGNOSIS — R252 Cramp and spasm: Secondary | ICD-10-CM | POA: Diagnosis not present

## 2015-03-15 LAB — COMPREHENSIVE METABOLIC PANEL
ALBUMIN: 4.4 g/dL (ref 3.5–5.2)
ALK PHOS: 104 U/L (ref 39–117)
ALT: 28 U/L (ref 0–35)
AST: 32 U/L (ref 0–37)
BILIRUBIN TOTAL: 0.4 mg/dL (ref 0.2–1.2)
BUN: 16 mg/dL (ref 6–23)
CO2: 27 mEq/L (ref 19–32)
Calcium: 9.9 mg/dL (ref 8.4–10.5)
Chloride: 103 mEq/L (ref 96–112)
Creatinine, Ser: 0.56 mg/dL (ref 0.40–1.20)
GFR: 113.14 mL/min (ref >60.00)
Glucose, Bld: 129 mg/dL — ABNORMAL HIGH (ref 70–99)
Potassium: 4.5 mEq/L (ref 3.5–5.1)
Sodium: 139 mEq/L (ref 135–145)
Total Protein: 7.2 g/dL (ref 6.0–8.3)

## 2015-03-15 LAB — MAGNESIUM: Magnesium: 1.8 mg/dL (ref 1.5–2.5)

## 2015-03-15 LAB — HEMOGLOBIN A1C: HEMOGLOBIN A1C: 7.5 % — AB (ref 4.6–6.5)

## 2015-03-15 NOTE — Progress Notes (Signed)
   Subjective:    Patient ID: Tracy Hammond, female    DOB: 1941/05/22, 74 y.o.   MRN: [id number]  HPI The patient is a 74 YO female coming in for muscle cramps in her legs. They have been going on for 2 months and happen during the day and at night time. She did try mustard for it but this did not help. They eventually go away on their own.  Her other problem is that she is moving her bowels multiple times after taking her metformin and eating. 4-5 times per morning. Makes her feel a little weak. Any food makes her run to the bathroom and she is not able to make it sometimes.   Review of Systems  Constitutional: Positive for activity change. Negative for fever, appetite change, fatigue and unexpected weight change.  Respiratory: Negative.   Cardiovascular: Negative.   Gastrointestinal: Positive for diarrhea. Negative for nausea, vomiting, abdominal pain, constipation, blood in stool and abdominal distention.  Musculoskeletal: Positive for myalgias. Negative for back pain and arthralgias.  Neurological: Negative.       Objective:   Physical Exam  Constitutional: She appears well-developed and well-nourished.  HENT:  Head: Normocephalic and atraumatic.  Eyes: EOM are normal.  Cardiovascular: Normal rate and regular rhythm.   Pulmonary/Chest: Effort normal and breath sounds normal.  Abdominal: Soft. Bowel sounds are normal. She exhibits no distension. There is no tenderness. There is no rebound.  Musculoskeletal: She exhibits no edema.  Skin: Skin is warm and dry.   Filed Vitals:   03/15/15 1442  BP: 140/70  Pulse: 83  Temp: 98.1 F (36.7 C)  TempSrc: Oral  Resp: 16  Height: 5\' 2"  (1.575 m)  Weight: 177 lb 1.9 oz (80.341 kg)  SpO2: 96%      Assessment & Plan:

## 2015-03-15 NOTE — Patient Instructions (Signed)
We will check the blood work today for vitamin levels, kidney function, liver function and the diabetes.   We will have you stop the metformin and wait to hear from [place] about if we need to replace it with something else.   This should help with the bowels and then that should help with the cramps.   Muscle Cramps and Spasms Muscle cramps and spasms occur when a muscle or muscles tighten and you have no control over this tightening (involuntary muscle contraction). They are a common problem and can develop in any muscle. The most common place is in the calf muscles of the leg. Both muscle cramps and muscle spasms are involuntary muscle contractions, but they also have differences:   Muscle cramps are sporadic and painful. They may last a few seconds to a quarter of an hour. Muscle cramps are often more forceful and last longer than muscle spasms.  Muscle spasms may or may not be painful. They may also last just a few seconds or much longer. CAUSES  It is uncommon for cramps or spasms to be due to a serious underlying problem. In many cases, the cause of cramps or spasms is unknown. Some common causes are:   Overexertion.   Overuse from repetitive motions (doing the same thing over and over).   Remaining in a certain position for a long period of time.   Improper preparation, form, or technique while performing a sport or activity.   Dehydration.   Injury.   Side effects of some medicines.   Abnormally low levels of the salts and ions in your blood (electrolytes), especially potassium and calcium. This could happen if you are taking water pills (diuretics) or you are pregnant.  Some underlying medical problems can make it more likely to develop cramps or spasms. These include, but are not limited to:   Diabetes.   Parkinson disease.   Hormone disorders, such as thyroid problems.   Alcohol abuse.   Diseases specific to muscles, joints, and bones.   Blood vessel  disease where not enough blood is getting to the muscles.  HOME CARE INSTRUCTIONS   Stay well hydrated. Drink enough water and fluids to keep your urine clear or pale yellow.  It may be helpful to massage, stretch, and relax the affected muscle.  For tight or tense muscles, use a warm towel, heating pad, or hot shower water directed to the affected area.  If you are sore or have pain after a cramp or spasm, applying ice to the affected area may relieve discomfort.  Put ice in a plastic bag.  Place a towel between your skin and the bag.  Leave the ice on for 15-20 minutes, 03-04 times a day.  Medicines used to treat a known cause of cramps or spasms may help reduce their frequency or severity. Only take over-the-counter or prescription medicines as directed by your caregiver. SEEK MEDICAL CARE IF:  Your cramps or spasms get more severe, more frequent, or do not improve over time.  MAKE SURE YOU:   Understand these instructions.  Will watch your condition.  Will get help right away if you are not doing well or get worse. Document Released: 02/06/2002 Document Revised: 12/12/2012 Document Reviewed: 08/03/2012 [facility] Patient Information 2015 [place], [place]. This information is not intended to replace advice given to you by your health care provider. Make sure you discuss any questions you have with your health care provider.

## 2015-03-15 NOTE — Assessment & Plan Note (Signed)
Suspect that this is caused by her metformin. She will hold and see if symptoms improve. Checking labs for signs of dehydration today.

## 2015-03-15 NOTE — Assessment & Plan Note (Signed)
Checking CMP and magnesium today for abnormalities. Not on statin therapy. Address as indicated. Possibly worsened by the current heat and increase in bowel movements since she has been avoiding food and liquids as triggers for the diarrhea.

## 2015-03-15 NOTE — Progress Notes (Signed)
Pre visit review using our clinic review tool, if applicable. No additional management support is needed unless otherwise documented below in the visit note. 

## 2015-03-15 NOTE — Assessment & Plan Note (Signed)
Checking HgA1c today, need to stop metformin due to likely side effect. Will add another agent based on the kidney function and Hga1c. Will likely need another agent given last HgA1c 7.6. Is on ACE-I.

## 2015-03-18 ENCOUNTER — Telehealth: Payer: Self-pay | Admitting: Internal Medicine

## 2015-03-18 ENCOUNTER — Encounter: Payer: Self-pay | Admitting: Internal Medicine

## 2015-03-18 NOTE — Telephone Encounter (Signed)
Spoke with patient and gave her the lab results 

## 2015-03-18 NOTE — Telephone Encounter (Signed)
Pt called in and is wanting to know the results of the blood work before she goes out of town tomorrow?     Best number [phone]

## 2015-04-08 ENCOUNTER — Telehealth: Payer: Self-pay | Admitting: *Deleted

## 2015-04-08 DIAGNOSIS — E119 Type 2 diabetes mellitus without complications: Secondary | ICD-10-CM

## 2015-04-08 MED ORDER — METFORMIN HCL 500 MG PO TABS: ORAL_TABLET | ORAL | Status: DC

## 2015-04-08 NOTE — Telephone Encounter (Signed)
Pt states at last visit md told her to stop taking metformin due to having frequent bowel movement. Pt states since she stop taking her BS running anywhere between 165-235. She is wanting to go back on the metformin. She states she didn't think the metformin was making her go because she still goes 2-3 times a day after breakfast. MD out of office Pls advise...Johny Chess

## 2015-04-08 NOTE — Telephone Encounter (Signed)
I reordered the metformin

## 2015-04-08 NOTE — Telephone Encounter (Signed)
Notified pt with md response.../lmb 

## 2015-06-03 ENCOUNTER — Other Ambulatory Visit: Payer: Self-pay | Admitting: Internal Medicine

## 2015-06-28 ENCOUNTER — Ambulatory Visit (INDEPENDENT_AMBULATORY_CARE_PROVIDER_SITE_OTHER): Payer: Medicare Other

## 2015-06-28 DIAGNOSIS — Z23 Encounter for immunization: Secondary | ICD-10-CM | POA: Diagnosis not present

## 2015-08-30 ENCOUNTER — Other Ambulatory Visit: Payer: Self-pay | Admitting: Internal Medicine

## 2015-09-05 ENCOUNTER — Other Ambulatory Visit (INDEPENDENT_AMBULATORY_CARE_PROVIDER_SITE_OTHER): Payer: Medicare Other

## 2015-09-05 ENCOUNTER — Ambulatory Visit (INDEPENDENT_AMBULATORY_CARE_PROVIDER_SITE_OTHER): Payer: Medicare Other | Admitting: Internal Medicine

## 2015-09-05 ENCOUNTER — Encounter: Payer: Self-pay | Admitting: Internal Medicine

## 2015-09-05 VITALS — BP 140/82 | HR 69 | Temp 98.1°F | Resp 12 | Ht 62.0 in | Wt 172.0 lb

## 2015-09-05 DIAGNOSIS — E119 Type 2 diabetes mellitus without complications: Secondary | ICD-10-CM

## 2015-09-05 DIAGNOSIS — F17200 Nicotine dependence, unspecified, uncomplicated: Secondary | ICD-10-CM

## 2015-09-05 DIAGNOSIS — Z23 Encounter for immunization: Secondary | ICD-10-CM | POA: Diagnosis not present

## 2015-09-05 DIAGNOSIS — Z0000 Encounter for general adult medical examination without abnormal findings: Secondary | ICD-10-CM | POA: Diagnosis not present

## 2015-09-05 DIAGNOSIS — I10 Essential (primary) hypertension: Secondary | ICD-10-CM

## 2015-09-05 DIAGNOSIS — E785 Hyperlipidemia, unspecified: Secondary | ICD-10-CM

## 2015-09-05 DIAGNOSIS — R252 Cramp and spasm: Secondary | ICD-10-CM

## 2015-09-05 DIAGNOSIS — E1169 Type 2 diabetes mellitus with other specified complication: Secondary | ICD-10-CM

## 2015-09-05 LAB — COMPREHENSIVE METABOLIC PANEL
ALBUMIN: 4.2 g/dL (ref 3.5–5.2)
ALK PHOS: 99 U/L (ref 39–117)
ALT: 27 U/L (ref 0–35)
AST: 33 U/L (ref 0–37)
BILIRUBIN TOTAL: 0.5 mg/dL (ref 0.2–1.2)
BUN: 10 mg/dL (ref 6–23)
CO2: 29 mEq/L (ref 19–32)
Calcium: 9.7 mg/dL (ref 8.4–10.5)
Chloride: 102 mEq/L (ref 96–112)
Creatinine, Ser: 0.48 mg/dL (ref 0.40–1.20)
GFR: 134.99 mL/min (ref >60.00)
Glucose, Bld: 158 mg/dL — ABNORMAL HIGH (ref 70–99)
POTASSIUM: 4.4 meq/L (ref 3.5–5.1)
SODIUM: 141 meq/L (ref 135–145)
TOTAL PROTEIN: 6.9 g/dL (ref 6.0–8.3)

## 2015-09-05 LAB — LIPID PANEL
CHOL/HDL RATIO: 3
Cholesterol: 122 mg/dL (ref 0–200)
HDL: 34.9 mg/dL — AB (ref >39.00)
LDL Cholesterol: 55 mg/dL (ref 0–99)
NONHDL: 87.1
Triglycerides: 162 mg/dL — ABNORMAL HIGH (ref 0.0–149.0)
VLDL: 32.4 mg/dL (ref 0.0–40.0)

## 2015-09-05 LAB — VITAMIN D 25 HYDROXY (VIT D DEFICIENCY, FRACTURES): VITD: 41.3 ng/mL (ref 30.00–100.00)

## 2015-09-05 LAB — HEMOGLOBIN A1C: HEMOGLOBIN A1C: 7.8 % — AB (ref 4.6–6.5)

## 2015-09-05 MED ORDER — TIZANIDINE HCL 2 MG PO TABS: 2.0000 mg | ORAL_TABLET | Freq: Every day | ORAL | Status: DC

## 2015-09-05 NOTE — Assessment & Plan Note (Signed)
Previous workup without good cause. She can try muscle relaxer qhs prn tizanidine which was rxed today.

## 2015-09-05 NOTE — Assessment & Plan Note (Signed)
Smoking less but not able to quit right now. She is trying to gradually reduce with the goal of no cigarettes. Reminded about the risks and harms of cigarette smoke.

## 2015-09-05 NOTE — Assessment & Plan Note (Signed)
Taking fosinopril and BP at goal, checking CMP and adjust as needed.

## 2015-09-05 NOTE — Assessment & Plan Note (Signed)
Checking lipid panel today, not on medication currently. Adjust as needed.

## 2015-09-05 NOTE — Assessment & Plan Note (Signed)
Checking HgA1c, foot exam done today. Not complicated. On metformin and ACE-I. Eye exam current. Talked to her about the need for exercise. Adjust as needed.

## 2015-09-05 NOTE — Progress Notes (Signed)
   Subjective:    Patient ID: Tracy Hammond, female    DOB: [birth year], 75 y.o.   MRN: [id number]  HPI Here for medicare wellness. Please see A/P for status and treatment of chronic medical problems.   HPI #2: She is following up on her medical conditions including her muscle cramps (still suffering and causing her to lose sleep, tried tylenol at night time which helps some, interrupted sleep), her blood pressure (BP at goal on fosinopril, not complicated), and her diabetes (not complicated, taking metformin and ACE-I, no hypoglycemia).   Diet: DM if diabetic Physical activity: sedentary Depression/mood screen: negative Hearing: intact to whispered voice Visual acuity: some loss, ok to drive, performs annual eye exam  ADLs: capable Fall risk: none Home safety: good Cognitive evaluation: intact to orientation, naming, recall and repetition EOL planning: adv directives discussed, discussed with family  I have personally reviewed and have noted 1. The patient's medical and social history - reviewed today no changes 2. Their use of alcohol, tobacco or illicit drugs 3. Their current medications and supplements 4. The patient's functional ability including ADL's, fall risks, home safety risks and hearing or visual impairment. 5. Diet and physical activities 6. Evidence for depression or mood disorders 7. Care team reviewed and updated (available in snapshot)  Review of Systems  Constitutional: Positive for activity change. Negative for fever, appetite change, fatigue and unexpected weight change.  HENT: Negative.   Eyes: Negative.   Respiratory: Negative for cough, chest tightness, shortness of breath and wheezing.   Cardiovascular: Negative for chest pain, palpitations and leg swelling.  Gastrointestinal: Positive for diarrhea. Negative for nausea, vomiting, abdominal pain, constipation, blood in stool and abdominal distention.  Musculoskeletal: Positive for myalgias. Negative for back  pain and arthralgias.  Skin: Negative.   Neurological: Negative.   Psychiatric/Behavioral: Negative.       Objective:   Physical Exam  Constitutional: She appears well-developed and well-nourished.  HENT:  Head: Normocephalic and atraumatic.  Eyes: EOM are normal.  Neck: Normal range of motion.  Cardiovascular: Normal rate and regular rhythm.   Pulmonary/Chest: Effort normal and breath sounds normal. No respiratory distress. She has no wheezes.  Abdominal: Soft. Bowel sounds are normal. She exhibits no distension. There is no tenderness. There is no rebound.  Musculoskeletal: She exhibits no edema.  Neurological: Coordination normal.  Skin: Skin is warm and dry.   Filed Vitals:   09/05/15 0923  BP: 140/82  Pulse: 69  Temp: 98.1 F (36.7 C)  TempSrc: Oral  Resp: 12  Height: 5\' 2"  (1.575 m)  Weight: 172 lb (78.019 kg)  SpO2: 98%      Assessment & Plan:  Prevnar 13 given at visit.

## 2015-09-05 NOTE — Patient Instructions (Signed)
We will check the blood work today and call you back with the results.   We have sent in the muscle relaxer called tizanidine that you can take before bedtime instead of the tylenol to see if this helps to take away the cramps.  Health Maintenance, Female Adopting a healthy lifestyle and getting preventive care can go a long way to promote health and wellness. Talk with your health care provider about what schedule of regular examinations is right for you. This is a good chance for you to check in with your provider about disease prevention and staying healthy. In between checkups, there are plenty of things you can do on your own. Experts have done a lot of research about which lifestyle changes and preventive measures are most likely to keep you healthy. Ask your health care provider for more information. WEIGHT AND DIET  Eat a healthy diet  Be sure to include plenty of vegetables, fruits, low-fat dairy products, and lean protein.  Do not eat a lot of foods high in solid fats, added sugars, or salt.  Get regular exercise. This is one of the most important things you can do for your health.  Most adults should exercise for at least 150 minutes each week. The exercise should increase your heart rate and make you sweat (moderate-intensity exercise).  Most adults should also do strengthening exercises at least twice a week. This is in addition to the moderate-intensity exercise.  Maintain a healthy weight  Body mass index (BMI) is a measurement that can be used to identify possible weight problems. It estimates body fat based on height and weight. Your health care provider can help determine your BMI and help you achieve or maintain a healthy weight.  For females 92 years of age and older:   A BMI below 18.5 is considered underweight.  A BMI of 18.5 to 24.9 is normal.  A BMI of 25 to 29.9 is considered overweight.  A BMI of 30 and above is considered obese.  Watch levels of  cholesterol and blood lipids  You should start having your blood tested for lipids and cholesterol at 73 years of age, then have this test every 5 years.  You may need to have your cholesterol levels checked more often if:  Your lipid or cholesterol levels are high.  You are older than 73 years of age.  You are at high risk for heart disease.  CANCER SCREENING   Lung Cancer  Lung cancer screening is recommended for adults 51-44 years old who are at high risk for lung cancer because of a history of smoking.  A yearly low-dose CT scan of the lungs is recommended for people who:  Currently smoke.  Have quit within the past 15 years.  Have at least a 30-pack-year history of smoking. A pack year is smoking an average of one pack of cigarettes a day for 1 year.  Yearly screening should continue until it has been 15 years since you quit.  Yearly screening should stop if you develop a health problem that would prevent you from having lung cancer treatment.  Breast Cancer  Practice breast self-awareness. This means understanding how your breasts normally appear and feel.  It also means doing regular breast self-exams. Let your health care provider know about any changes, no matter how small.  If you are in your 20s or 30s, you should have a clinical breast exam (CBE) by a health care provider every 1-3 years as part of a  regular health exam.  If you are 40 or older, have a CBE every year. Also consider having a breast X-ray (mammogram) every year.  If you have a family history of breast cancer, talk to your health care provider about genetic screening.  If you are at high risk for breast cancer, talk to your health care provider about having an MRI and a mammogram every year.  Breast cancer gene (BRCA) assessment is recommended for women who have family members with BRCA-related cancers. BRCA-related cancers include:  Breast.  Ovarian.  Tubal.  Peritoneal  cancers.  Results of the assessment will determine the need for genetic counseling and BRCA1 and BRCA2 testing. Cervical Cancer Your health care provider may recommend that you be screened regularly for cancer of the pelvic organs (ovaries, uterus, and vagina). This screening involves a pelvic examination, including checking for microscopic changes to the surface of your cervix (Pap test). You may be encouraged to have this screening done every 3 years, beginning at age 83.  For women ages 41-65, health care providers may recommend pelvic exams and Pap testing every 3 years, or they may recommend the Pap and pelvic exam, combined with testing for human papilloma virus (HPV), every 5 years. Some types of HPV increase your risk of cervical cancer. Testing for HPV may also be done on women of any age with unclear Pap test results.  Other health care providers may not recommend any screening for nonpregnant women who are considered low risk for pelvic cancer and who do not have symptoms. Ask your health care provider if a screening pelvic exam is right for you.  If you have had past treatment for cervical cancer or a condition that could lead to cancer, you need Pap tests and screening for cancer for at least 20 years after your treatment. If Pap tests have been discontinued, your risk factors (such as having a new sexual partner) need to be reassessed to determine if screening should resume. Some women have medical problems that increase the chance of getting cervical cancer. In these cases, your health care provider may recommend more frequent screening and Pap tests. Colorectal Cancer  This type of cancer can be detected and often prevented.  Routine colorectal cancer screening usually begins at 73 years of age and continues through 73 years of age.  Your health care provider may recommend screening at an earlier age if you have risk factors for colon cancer.  Your health care provider may also  recommend using home test kits to check for hidden blood in the stool.  A small camera at the end of a tube can be used to examine your colon directly (sigmoidoscopy or colonoscopy). This is done to check for the earliest forms of colorectal cancer.  Routine screening usually begins at age 46.  Direct examination of the colon should be repeated every 5-10 years through 73 years of age. However, you may need to be screened more often if early forms of precancerous polyps or small growths are found. Skin Cancer  Check your skin from head to toe regularly.  Tell your health care provider about any new moles or changes in moles, especially if there is a change in a mole's shape or color.  Also tell your health care provider if you have a mole that is larger than the size of a pencil eraser.  Always use sunscreen. Apply sunscreen liberally and repeatedly throughout the day.  Protect yourself by wearing long sleeves, pants, a wide-brimmed hat,  and sunglasses whenever you are outside. HEART DISEASE, DIABETES, AND HIGH BLOOD PRESSURE   High blood pressure causes heart disease and increases the risk of stroke. High blood pressure is more likely to develop in:  People who have blood pressure in the high end of the normal range (130-139/85-89 mm Hg).  People who are overweight or obese.  People who are African American.  If you are 63-65 years of age, have your blood pressure checked every 3-5 years. If you are 96 years of age or older, have your blood pressure checked every year. You should have your blood pressure measured twice--once when you are at a hospital or clinic, and once when you are not at a hospital or clinic. Record the average of the two measurements. To check your blood pressure when you are not at a hospital or clinic, you can use:  An automated blood pressure machine at a pharmacy.  A home blood pressure monitor.  If you are between 80 years and 26 years old, ask your health  care provider if you should take aspirin to prevent strokes.  Have regular diabetes screenings. This involves taking a blood sample to check your fasting blood sugar level.  If you are at a normal weight and have a low risk for diabetes, have this test once every three years after 73 years of age.  If you are overweight and have a high risk for diabetes, consider being tested at a younger age or more often. PREVENTING INFECTION  Hepatitis B  If you have a higher risk for hepatitis B, you should be screened for this virus. You are considered at high risk for hepatitis B if:  You were born in a country where hepatitis B is common. Ask your health care provider which countries are considered high risk.  Your parents were born in a high-risk country, and you have not been immunized against hepatitis B (hepatitis B vaccine).  You have HIV or AIDS.  You use needles to inject street drugs.  You live with someone who has hepatitis B.  You have had sex with someone who has hepatitis B.  You get hemodialysis treatment.  You take certain medicines for conditions, including cancer, organ transplantation, and autoimmune conditions. Hepatitis C  Blood testing is recommended for:  Everyone born from [birth year] through 1963.  Anyone with known risk factors for hepatitis C. Sexually transmitted infections (STIs)  You should be screened for sexually transmitted infections (STIs) including gonorrhea and chlamydia if:  You are sexually active and are younger than 73 years of age.  You are older than 73 years of age and your health care provider tells you that you are at risk for this type of infection.  Your sexual activity has changed since you were last screened and you are at an increased risk for chlamydia or gonorrhea. Ask your health care provider if you are at risk.  If you do not have HIV, but are at risk, it may be recommended that you take a prescription medicine daily to prevent HIV  infection. This is called pre-exposure prophylaxis (PrEP). You are considered at risk if:  You are sexually active and do not regularly use condoms or know the HIV status of your partner(s).  You take drugs by injection.  You are sexually active with a partner who has HIV. Talk with your health care provider about whether you are at high risk of being infected with HIV. If you choose to begin PrEP, you should  first be tested for HIV. You should then be tested every 3 months for as long as you are taking PrEP.  PREGNANCY   If you are premenopausal and you may become pregnant, ask your health care provider about preconception counseling.  If you may become pregnant, take 400 to 800 micrograms (mcg) of folic acid every day.  If you want to prevent pregnancy, talk to your health care provider about birth control (contraception). OSTEOPOROSIS AND MENOPAUSE   Osteoporosis is a disease in which the bones lose minerals and strength with aging. This can result in serious bone fractures. Your risk for osteoporosis can be identified using a bone density scan.  If you are 69 years of age or older, or if you are at risk for osteoporosis and fractures, ask your health care provider if you should be screened.  Ask your health care provider whether you should take a calcium or vitamin D supplement to lower your risk for osteoporosis.  Menopause may have certain physical symptoms and risks.  Hormone replacement therapy may reduce some of these symptoms and risks. Talk to your health care provider about whether hormone replacement therapy is right for you.  HOME CARE INSTRUCTIONS   Schedule regular health, dental, and eye exams.  Stay current with your immunizations.   Do not use any tobacco products including cigarettes, chewing tobacco, or electronic cigarettes.  If you are pregnant, do not drink alcohol.  If you are breastfeeding, limit how much and how often you drink alcohol.  Limit  alcohol intake to no more than 1 drink per day for nonpregnant women. One drink equals 12 ounces of beer, 5 ounces of wine, or 1 ounces of hard liquor.  Do not use street drugs.  Do not share needles.  Ask your health care provider for help if you need support or information about quitting drugs.  Tell your health care provider if you often feel depressed.  Tell your health care provider if you have ever been abused or do not feel safe at home.   This information is not intended to replace advice given to you by your health care provider. Make sure you discuss any questions you have with your health care provider.   Document Released: 03/02/2011 Document Revised: 09/07/2014 Document Reviewed: 07/19/2013 Elsevier Interactive Patient Education [employer].

## 2015-09-05 NOTE — Progress Notes (Signed)
Pre visit review using our clinic review tool, if applicable. No additional management support is needed unless otherwise documented below in the visit note. 

## 2015-09-05 NOTE — Assessment & Plan Note (Signed)
Colonoscopy and mammogram up to date. Given prevnar 13 to complete pneumonia series. Immunizations up to date. Talked to her about regular exercise and sun safety. She does get yearly skin exam from dermatologist. Given 10 year screening recommendations at visit today.

## 2015-09-10 ENCOUNTER — Other Ambulatory Visit: Payer: Self-pay | Admitting: Internal Medicine

## 2015-09-11 ENCOUNTER — Encounter: Payer: Self-pay | Admitting: Internal Medicine

## 2015-09-11 ENCOUNTER — Ambulatory Visit (INDEPENDENT_AMBULATORY_CARE_PROVIDER_SITE_OTHER): Payer: Medicare Other | Admitting: Internal Medicine

## 2015-09-11 VITALS — BP 128/98 | HR 75 | Temp 98.2°F | Resp 18 | Ht 62.0 in | Wt 171.0 lb

## 2015-09-11 DIAGNOSIS — F17200 Nicotine dependence, unspecified, uncomplicated: Secondary | ICD-10-CM | POA: Diagnosis not present

## 2015-09-11 DIAGNOSIS — J209 Acute bronchitis, unspecified: Secondary | ICD-10-CM

## 2015-09-11 MED ORDER — ALBUTEROL SULFATE HFA 108 (90 BASE) MCG/ACT IN AERS: 2.0000 | INHALATION_SPRAY | Freq: Four times a day (QID) | RESPIRATORY_TRACT | Status: DC | PRN

## 2015-09-11 MED ORDER — DOXYCYCLINE HYCLATE 100 MG PO TABS: 100.0000 mg | ORAL_TABLET | Freq: Two times a day (BID) | ORAL | Status: DC

## 2015-09-11 NOTE — Patient Instructions (Addendum)
We have given you the steroid shot today in the office. This will help to clear the lungs.   We have also sent in doxycycline which is a good antibiotic for the lungs. Take 1 pill twice a day for 10 days.   We have also sent in an albuterol inhaler to help with the breathing and for less coughing.   If your breathing worsens please call the office or get help at the emergency room.

## 2015-09-11 NOTE — Progress Notes (Signed)
Pre visit review using our clinic review tool, if applicable. No additional management support is needed unless otherwise documented below in the visit note. 

## 2015-09-12 DIAGNOSIS — J209 Acute bronchitis, unspecified: Secondary | ICD-10-CM | POA: Insufficient documentation

## 2015-09-12 MED ORDER — METHYLPREDNISOLONE ACETATE 40 MG/ML IJ SUSP
40.0000 mg | Freq: Once | INTRAMUSCULAR | Status: AC
  Administered 2015-09-12: 40 mg via INTRAMUSCULAR

## 2015-09-12 NOTE — Assessment & Plan Note (Signed)
She is still smoking and advised her of the need to quit.

## 2015-09-12 NOTE — Progress Notes (Signed)
   Subjective:    Patient ID: Tracy Hammond, female    DOB: [birth year], 75 y.o.   MRN: [id number]  HPI The patient is a 75 YO female coming in for acute for cough, SOB, fevers. Symptoms started about 1 week ago. Overall worsening. She is taking OTC cold medication. Coughing up green stuff. SOB with walking. Does not have an inhaler right now. Some chills as well. She is having sinus congestions and drainage as well. Mild sore throat.   Review of Systems  Constitutional: Positive for fever, chills, activity change and fatigue. Negative for appetite change and unexpected weight change.  HENT: Positive for congestion, rhinorrhea, sinus pressure and sore throat. Negative for ear discharge, ear pain, postnasal drip and trouble swallowing.   Eyes: Negative.   Respiratory: Positive for cough, shortness of breath and wheezing. Negative for chest tightness.   Cardiovascular: Negative for chest pain, palpitations and leg swelling.  Gastrointestinal: Negative for nausea, vomiting, abdominal pain, diarrhea, constipation, blood in stool and abdominal distention.  Musculoskeletal: Positive for myalgias. Negative for back pain and arthralgias.  Skin: Negative.   Neurological: Negative.   Psychiatric/Behavioral: Negative.       Objective:   Physical Exam  Constitutional: She appears well-developed and well-nourished.  HENT:  Head: Normocephalic and atraumatic.  Oropharynx with erythema and drainage, nasal turbinates swollen.   Eyes: EOM are normal.  Neck: Normal range of motion.  Cardiovascular: Normal rate and regular rhythm.   Pulmonary/Chest: Effort normal. No respiratory distress. She has wheezes. She has no rales.  Diffuse expiratory wheezing does not clear with coughing  Abdominal: Soft. She exhibits no distension. There is no tenderness. There is no rebound.  Musculoskeletal: She exhibits no edema.  Lymphadenopathy:    She has no cervical adenopathy.  Neurological: Coordination normal.    Filed Vitals:   09/11/15 1125 09/11/15 1131  BP: 168/82 128/98  Pulse: 75   Temp: 98.2 F (36.8 C)   TempSrc: Oral   Resp: 18   Height: 5\' 2"  (1.575 m)   Weight: 171 lb (77.565 kg)   SpO2: 95%       Assessment & Plan:  Depo-medrol 40 mg IM given at visit.

## 2015-09-12 NOTE — Assessment & Plan Note (Signed)
Given depo-medrol IM at visit, albuterol inhaler rx and doxycycline. Suspect that she has some early COPD with underlying infection.

## 2015-09-12 NOTE — Addendum Note (Signed)
Addended by: Resa Miner R on: 09/12/2015 12:23 PM   Modules accepted: Orders

## 2015-10-04 NOTE — Progress Notes (Signed)
Patient came in on Jan. 11th. Depo Medrol was administered on Jan. 11th. I had trouble ordering it and didn't get it ordered until Jan 12th. I forgot to change the date on the calender to the 11th when I charted the medication.

## 2015-10-08 DIAGNOSIS — H43813 Vitreous degeneration, bilateral: Secondary | ICD-10-CM | POA: Diagnosis not present

## 2015-10-08 DIAGNOSIS — H35371 Puckering of macula, right eye: Secondary | ICD-10-CM | POA: Diagnosis not present

## 2015-10-08 DIAGNOSIS — H353211 Exudative age-related macular degeneration, right eye, with active choroidal neovascularization: Secondary | ICD-10-CM | POA: Diagnosis not present

## 2015-10-08 DIAGNOSIS — H353223 Exudative age-related macular degeneration, left eye, with inactive scar: Secondary | ICD-10-CM | POA: Diagnosis not present

## 2015-10-22 DIAGNOSIS — H353223 Exudative age-related macular degeneration, left eye, with inactive scar: Secondary | ICD-10-CM | POA: Diagnosis not present

## 2015-10-22 DIAGNOSIS — Z961 Presence of intraocular lens: Secondary | ICD-10-CM | POA: Diagnosis not present

## 2015-10-22 DIAGNOSIS — H11423 Conjunctival edema, bilateral: Secondary | ICD-10-CM | POA: Diagnosis not present

## 2015-10-22 DIAGNOSIS — H11153 Pinguecula, bilateral: Secondary | ICD-10-CM | POA: Diagnosis not present

## 2015-10-22 DIAGNOSIS — H2701 Aphakia, right eye: Secondary | ICD-10-CM | POA: Diagnosis not present

## 2015-10-22 DIAGNOSIS — H25012 Cortical age-related cataract, left eye: Secondary | ICD-10-CM | POA: Diagnosis not present

## 2015-10-22 DIAGNOSIS — H401132 Primary open-angle glaucoma, bilateral, moderate stage: Secondary | ICD-10-CM | POA: Diagnosis not present

## 2015-10-22 DIAGNOSIS — H2512 Age-related nuclear cataract, left eye: Secondary | ICD-10-CM | POA: Diagnosis not present

## 2015-10-22 DIAGNOSIS — H353211 Exudative age-related macular degeneration, right eye, with active choroidal neovascularization: Secondary | ICD-10-CM | POA: Diagnosis not present

## 2015-10-22 DIAGNOSIS — H18413 Arcus senilis, bilateral: Secondary | ICD-10-CM | POA: Diagnosis not present

## 2015-10-28 ENCOUNTER — Telehealth: Payer: Self-pay | Admitting: Internal Medicine

## 2015-10-28 NOTE — Telephone Encounter (Signed)
Pt called in regarding her legs. She says she was given some medicine for them and it doesn't seem to be helping. Can you please give her a call at [phone]

## 2015-10-29 NOTE — Telephone Encounter (Signed)
Would recommend visit to discuss if it is helping at all and other options.

## 2015-10-29 NOTE — Telephone Encounter (Signed)
You gave patient tizanidine for muscle pain and she said she is not sleeping and she is being rude to people due to her constant pain. Please advise on another option, thanks.

## 2015-10-29 NOTE — Telephone Encounter (Signed)
appt made

## 2015-11-06 DIAGNOSIS — H353211 Exudative age-related macular degeneration, right eye, with active choroidal neovascularization: Secondary | ICD-10-CM | POA: Diagnosis not present

## 2015-11-08 ENCOUNTER — Ambulatory Visit (INDEPENDENT_AMBULATORY_CARE_PROVIDER_SITE_OTHER): Payer: Medicare Other | Admitting: Internal Medicine

## 2015-11-08 ENCOUNTER — Encounter: Payer: Self-pay | Admitting: Internal Medicine

## 2015-11-08 VITALS — BP 158/80 | HR 75 | Temp 97.9°F | Resp 16 | Ht 62.0 in | Wt 169.0 lb

## 2015-11-08 DIAGNOSIS — M79669 Pain in unspecified lower leg: Secondary | ICD-10-CM | POA: Diagnosis not present

## 2015-11-08 NOTE — Patient Instructions (Signed)
It is okay to take the tylenol arthritis but do not take more than 3000 mg of tylenol in 24 hours.   We will get the ultrasound of the legs to look for blood clots.   It is okay to try chewing on a tums before bedtime to see if this helps with the cramps. I would also recommend to take a magnesium supplement daily to see if that helps as well.   Stopping smoking will also help the blood flow in the legs.

## 2015-11-08 NOTE — Progress Notes (Signed)
Pre visit review using our clinic review tool, if applicable. No additional management support is needed unless otherwise documented below in the visit note. 

## 2015-11-10 DIAGNOSIS — M79669 Pain in unspecified lower leg: Secondary | ICD-10-CM | POA: Insufficient documentation

## 2015-11-10 NOTE — Assessment & Plan Note (Signed)
We did discuss that some of this could be a form of claudication and she needs to stop smoking. She is not interested today. Advised to try daily magnesium for the pain. Checking LE venous bilateral. Reminded her that varicose veins can also be painful but are not DVTs.

## 2015-11-10 NOTE — Progress Notes (Signed)
   Subjective:    Patient ID: Tracy Hammond, female    DOB: Jun 11, 1941, 75 y.o.   MRN: [id number]  HPI The patient is a 75 YO female coming for ongoing leg pain, still present for the last 6-8 months. She is concerned about blood clots as her right leg is a little swollen in the right calf. They hurt her with activity and painful at night time. Get what feels like cramps in her whole legs. Still smoking as much as before and not sure she wants to stop. Concerned about blood clots due to multiple family members having leg blood clots. Also has varicose veins in her legs.   Review of Systems  Constitutional: Negative for fever, chills, activity change, appetite change, fatigue and unexpected weight change.  Eyes: Negative.   Respiratory: Negative for cough, chest tightness, shortness of breath and wheezing.   Cardiovascular: Negative for chest pain, palpitations and leg swelling.  Gastrointestinal: Negative for nausea, vomiting, abdominal pain, diarrhea, constipation, blood in stool and abdominal distention.  Musculoskeletal: Positive for myalgias. Negative for back pain and arthralgias.  Skin: Negative.   Neurological: Negative.   Psychiatric/Behavioral: Negative.       Objective:   Physical Exam  Constitutional: She appears well-developed and well-nourished.  HENT:  Head: Normocephalic and atraumatic.  Eyes: EOM are normal.  Neck: Normal range of motion.  Cardiovascular: Normal rate and regular rhythm.   Pulmonary/Chest: Effort normal and breath sounds normal. No respiratory distress. She has no wheezes.  Abdominal: Soft. Bowel sounds are normal. She exhibits no distension. There is no tenderness. There is no rebound.  Musculoskeletal: She exhibits tenderness. She exhibits no edema.  Pain in bilateral calfs, no swelling noted.   Neurological: Coordination normal.  Skin: Skin is warm and dry.   Filed Vitals:   11/08/15 1534  BP: 158/80  Pulse: 75  Temp: 97.9 F (36.6 C)    TempSrc: Oral  Resp: 16  Height: 5\' 2"  (1.575 m)  Weight: 169 lb (76.658 kg)  SpO2: 97%      Assessment & Plan:

## 2015-11-13 ENCOUNTER — Ambulatory Visit (HOSPITAL_COMMUNITY)
Admission: RE | Admit: 2015-11-13 | Discharge: 2015-11-13 | Disposition: A | Discharge status: Home / Self Care | Payer: Medicare Other | Source: Ambulatory Visit | Attending: Internal Medicine | Admitting: Internal Medicine

## 2015-11-13 DIAGNOSIS — M79662 Pain in left lower leg: Secondary | ICD-10-CM | POA: Diagnosis not present

## 2015-11-13 DIAGNOSIS — M79661 Pain in right lower leg: Secondary | ICD-10-CM | POA: Insufficient documentation

## 2015-11-13 DIAGNOSIS — I10 Essential (primary) hypertension: Secondary | ICD-10-CM | POA: Diagnosis not present

## 2015-11-13 DIAGNOSIS — E119 Type 2 diabetes mellitus without complications: Secondary | ICD-10-CM | POA: Diagnosis not present

## 2015-11-13 DIAGNOSIS — E7800 Pure hypercholesterolemia, unspecified: Secondary | ICD-10-CM | POA: Insufficient documentation

## 2015-11-13 DIAGNOSIS — M79669 Pain in unspecified lower leg: Secondary | ICD-10-CM

## 2015-12-02 ENCOUNTER — Other Ambulatory Visit: Payer: Self-pay | Admitting: Internal Medicine

## 2015-12-09 ENCOUNTER — Other Ambulatory Visit: Payer: Self-pay | Admitting: Internal Medicine

## 2015-12-20 ENCOUNTER — Other Ambulatory Visit: Payer: Self-pay | Admitting: Internal Medicine

## 2016-01-14 DIAGNOSIS — H353210 Exudative age-related macular degeneration, right eye, stage unspecified: Secondary | ICD-10-CM | POA: Diagnosis not present

## 2016-01-14 DIAGNOSIS — H2512 Age-related nuclear cataract, left eye: Secondary | ICD-10-CM | POA: Diagnosis not present

## 2016-01-14 DIAGNOSIS — Z961 Presence of intraocular lens: Secondary | ICD-10-CM | POA: Diagnosis not present

## 2016-01-14 DIAGNOSIS — H353220 Exudative age-related macular degeneration, left eye, stage unspecified: Secondary | ICD-10-CM | POA: Diagnosis not present

## 2016-01-14 DIAGNOSIS — H18411 Arcus senilis, right eye: Secondary | ICD-10-CM | POA: Diagnosis not present

## 2016-01-14 DIAGNOSIS — H18412 Arcus senilis, left eye: Secondary | ICD-10-CM | POA: Diagnosis not present

## 2016-01-15 DIAGNOSIS — H35371 Puckering of macula, right eye: Secondary | ICD-10-CM | POA: Diagnosis not present

## 2016-01-15 DIAGNOSIS — H35423 Microcystoid degeneration of retina, bilateral: Secondary | ICD-10-CM | POA: Diagnosis not present

## 2016-01-15 DIAGNOSIS — H353231 Exudative age-related macular degeneration, bilateral, with active choroidal neovascularization: Secondary | ICD-10-CM | POA: Diagnosis not present

## 2016-01-15 DIAGNOSIS — H33191 Other retinoschisis and retinal cysts, right eye: Secondary | ICD-10-CM | POA: Diagnosis not present

## 2016-01-20 ENCOUNTER — Other Ambulatory Visit: Payer: Self-pay | Admitting: Internal Medicine

## 2016-01-28 DIAGNOSIS — H401133 Primary open-angle glaucoma, bilateral, severe stage: Secondary | ICD-10-CM | POA: Diagnosis not present

## 2016-02-18 DIAGNOSIS — H35371 Puckering of macula, right eye: Secondary | ICD-10-CM | POA: Diagnosis not present

## 2016-02-18 DIAGNOSIS — H35423 Microcystoid degeneration of retina, bilateral: Secondary | ICD-10-CM | POA: Diagnosis not present

## 2016-02-18 DIAGNOSIS — H353231 Exudative age-related macular degeneration, bilateral, with active choroidal neovascularization: Secondary | ICD-10-CM | POA: Diagnosis not present

## 2016-02-21 ENCOUNTER — Other Ambulatory Visit: Payer: Self-pay | Admitting: Internal Medicine

## 2016-02-25 ENCOUNTER — Other Ambulatory Visit: Payer: Self-pay | Admitting: Internal Medicine

## 2016-03-02 ENCOUNTER — Other Ambulatory Visit: Payer: Self-pay | Admitting: Internal Medicine

## 2016-03-10 ENCOUNTER — Other Ambulatory Visit: Payer: Self-pay | Admitting: Internal Medicine

## 2016-03-23 ENCOUNTER — Other Ambulatory Visit: Payer: Self-pay | Admitting: Internal Medicine

## 2016-03-25 DIAGNOSIS — H2512 Age-related nuclear cataract, left eye: Secondary | ICD-10-CM | POA: Diagnosis not present

## 2016-03-25 DIAGNOSIS — H401123 Primary open-angle glaucoma, left eye, severe stage: Secondary | ICD-10-CM | POA: Diagnosis not present

## 2016-03-25 DIAGNOSIS — H353221 Exudative age-related macular degeneration, left eye, with active choroidal neovascularization: Secondary | ICD-10-CM | POA: Diagnosis not present

## 2016-03-25 DIAGNOSIS — H401114 Primary open-angle glaucoma, right eye, indeterminate stage: Secondary | ICD-10-CM | POA: Diagnosis not present

## 2016-03-30 ENCOUNTER — Telehealth: Payer: Self-pay | Admitting: Internal Medicine

## 2016-03-30 NOTE — Telephone Encounter (Signed)
Pt called in and would like nurse to call her.  She said that her legs are weak and would like to know if Dr Sharlet Salina would like to see her or she should he go to her ortho? She wanted me to also let you know that this is not related to the craps in her legs.Marland Kitchen

## 2016-03-31 ENCOUNTER — Other Ambulatory Visit: Payer: Self-pay | Admitting: Internal Medicine

## 2016-03-31 NOTE — Telephone Encounter (Signed)
Would recommend she start by calling her orthopedic doctor.

## 2016-03-31 NOTE — Telephone Encounter (Signed)
Patient says when she is walking her legs catch like she is going to fall. Do you want to see her, or place a referral.

## 2016-03-31 NOTE — Telephone Encounter (Signed)
Left message on voice mail informing patient to call ortho.

## 2016-04-03 NOTE — Telephone Encounter (Signed)
Patient called back about this. I informed her of the notes, she understood.Tracy Hammond

## 2016-04-08 DIAGNOSIS — Z961 Presence of intraocular lens: Secondary | ICD-10-CM | POA: Diagnosis not present

## 2016-04-08 DIAGNOSIS — H401114 Primary open-angle glaucoma, right eye, indeterminate stage: Secondary | ICD-10-CM | POA: Diagnosis not present

## 2016-04-08 DIAGNOSIS — H401123 Primary open-angle glaucoma, left eye, severe stage: Secondary | ICD-10-CM | POA: Diagnosis not present

## 2016-04-08 DIAGNOSIS — H353221 Exudative age-related macular degeneration, left eye, with active choroidal neovascularization: Secondary | ICD-10-CM | POA: Diagnosis not present

## 2016-04-08 DIAGNOSIS — H2512 Age-related nuclear cataract, left eye: Secondary | ICD-10-CM | POA: Diagnosis not present

## 2016-04-09 DIAGNOSIS — M25551 Pain in right hip: Secondary | ICD-10-CM | POA: Diagnosis not present

## 2016-04-09 DIAGNOSIS — M545 Low back pain: Secondary | ICD-10-CM | POA: Diagnosis not present

## 2016-04-15 DIAGNOSIS — H353211 Exudative age-related macular degeneration, right eye, with active choroidal neovascularization: Secondary | ICD-10-CM | POA: Diagnosis not present

## 2016-04-15 DIAGNOSIS — H353223 Exudative age-related macular degeneration, left eye, with inactive scar: Secondary | ICD-10-CM | POA: Diagnosis not present

## 2016-04-15 DIAGNOSIS — H35371 Puckering of macula, right eye: Secondary | ICD-10-CM | POA: Diagnosis not present

## 2016-04-15 DIAGNOSIS — H35423 Microcystoid degeneration of retina, bilateral: Secondary | ICD-10-CM | POA: Diagnosis not present

## 2016-04-21 ENCOUNTER — Other Ambulatory Visit: Payer: Self-pay | Admitting: Internal Medicine

## 2016-04-24 DIAGNOSIS — Z79899 Other long term (current) drug therapy: Secondary | ICD-10-CM | POA: Diagnosis not present

## 2016-04-24 DIAGNOSIS — S0181XA Laceration without foreign body of other part of head, initial encounter: Secondary | ICD-10-CM | POA: Diagnosis not present

## 2016-04-24 DIAGNOSIS — W010XXA Fall on same level from slipping, tripping and stumbling without subsequent striking against object, initial encounter: Secondary | ICD-10-CM | POA: Diagnosis not present

## 2016-04-24 DIAGNOSIS — Y9222 Religious institution as the place of occurrence of the external cause: Secondary | ICD-10-CM | POA: Diagnosis not present

## 2016-04-24 DIAGNOSIS — Z7982 Long term (current) use of aspirin: Secondary | ICD-10-CM | POA: Diagnosis not present

## 2016-05-05 ENCOUNTER — Encounter: Payer: Self-pay | Admitting: Internal Medicine

## 2016-05-05 ENCOUNTER — Ambulatory Visit (INDEPENDENT_AMBULATORY_CARE_PROVIDER_SITE_OTHER): Payer: Medicare Other | Admitting: Internal Medicine

## 2016-05-05 DIAGNOSIS — S0191XD Laceration without foreign body of unspecified part of head, subsequent encounter: Secondary | ICD-10-CM

## 2016-05-05 DIAGNOSIS — S0191XA Laceration without foreign body of unspecified part of head, initial encounter: Secondary | ICD-10-CM | POA: Insufficient documentation

## 2016-05-05 DIAGNOSIS — Z23 Encounter for immunization: Secondary | ICD-10-CM | POA: Diagnosis not present

## 2016-05-05 NOTE — Progress Notes (Signed)
   Subjective:    Patient ID: Tracy Hammond, female    DOB: 1941/08/25, 75 y.o.   MRN: [id number]  HPI The patient is a 75 YO female coming in for suture removal. There are 10 stitches above the eyes. Got them done at an urgent care about 04/24/16 days ago. She fell in a parking lot and hurt herself after a funeral. Denies LOC or headaches since that time. She was evaluated at the time and no head injury. No known allergies. Taking aspirin daily 81 mg only anticoagulant.   Review of Systems  Constitutional: Negative for activity change, appetite change, chills, fatigue, fever and unexpected weight change.  Respiratory: Negative for cough, chest tightness, shortness of breath and wheezing.   Cardiovascular: Negative for chest pain, palpitations and leg swelling.  Gastrointestinal: Negative for abdominal distention, abdominal pain, blood in stool, constipation, diarrhea, nausea and vomiting.  Musculoskeletal: Positive for myalgias. Negative for arthralgias and back pain.  Skin: Positive for wound.  Neurological: Negative.       Objective:   Physical Exam  Constitutional: She appears well-developed and well-nourished.  HENT:  Head: Normocephalic and atraumatic.  Eyes: EOM are normal.  Neck: Normal range of motion.  Cardiovascular: Normal rate and regular rhythm.   Pulmonary/Chest: Effort normal and breath sounds normal. No respiratory distress. She has no wheezes.  Abdominal: Soft. Bowel sounds are normal. She exhibits no distension. There is no tenderness. There is no rebound.  Musculoskeletal: She exhibits tenderness. She exhibits no edema.  Mild pain in her legs.   Neurological: Coordination normal.  Skin: Skin is warm and dry.  10 sutures removed above the left eyebrow   Vitals:   05/05/16 1432  BP: 120/70  Pulse: 77  Resp: 18  Temp: 98.4 F (36.9 C)  TempSrc: Oral  SpO2: 98%  Weight: 171 lb (77.6 kg)      Assessment & Plan:  Flu shot given at visit.

## 2016-05-05 NOTE — Assessment & Plan Note (Signed)
10 sutures removed from above the left eyelid, appears to be healing appropriately. No bleeding on exam today. Given instructions on need to return for bleeding that will not stop, redness or swelling around the area.

## 2016-05-05 NOTE — Patient Instructions (Signed)
We have taken out the stitches today and you will continue to heal.

## 2016-05-08 DIAGNOSIS — M25551 Pain in right hip: Secondary | ICD-10-CM | POA: Diagnosis not present

## 2016-05-21 ENCOUNTER — Other Ambulatory Visit: Payer: Self-pay | Admitting: Internal Medicine

## 2016-06-01 ENCOUNTER — Other Ambulatory Visit: Payer: Self-pay | Admitting: Internal Medicine

## 2016-06-05 DIAGNOSIS — Z01419 Encounter for gynecological examination (general) (routine) without abnormal findings: Secondary | ICD-10-CM | POA: Diagnosis not present

## 2016-06-09 DIAGNOSIS — H33101 Unspecified retinoschisis, right eye: Secondary | ICD-10-CM | POA: Diagnosis not present

## 2016-06-09 DIAGNOSIS — H35423 Microcystoid degeneration of retina, bilateral: Secondary | ICD-10-CM | POA: Diagnosis not present

## 2016-06-09 DIAGNOSIS — H353223 Exudative age-related macular degeneration, left eye, with inactive scar: Secondary | ICD-10-CM | POA: Diagnosis not present

## 2016-06-09 DIAGNOSIS — H353211 Exudative age-related macular degeneration, right eye, with active choroidal neovascularization: Secondary | ICD-10-CM | POA: Diagnosis not present

## 2016-06-10 ENCOUNTER — Ambulatory Visit (INDEPENDENT_AMBULATORY_CARE_PROVIDER_SITE_OTHER): Payer: Medicare Other | Admitting: Orthopaedic Surgery

## 2016-06-10 DIAGNOSIS — M25551 Pain in right hip: Secondary | ICD-10-CM | POA: Diagnosis not present

## 2016-06-11 ENCOUNTER — Other Ambulatory Visit: Payer: Self-pay | Admitting: Internal Medicine

## 2016-06-18 ENCOUNTER — Other Ambulatory Visit: Payer: Self-pay | Admitting: Internal Medicine

## 2016-06-19 DIAGNOSIS — Z1231 Encounter for screening mammogram for malignant neoplasm of breast: Secondary | ICD-10-CM | POA: Diagnosis not present

## 2016-07-20 ENCOUNTER — Other Ambulatory Visit: Payer: Self-pay | Admitting: Internal Medicine

## 2016-07-29 ENCOUNTER — Encounter: Payer: Self-pay | Admitting: Internal Medicine

## 2016-08-05 DIAGNOSIS — H2512 Age-related nuclear cataract, left eye: Secondary | ICD-10-CM | POA: Diagnosis not present

## 2016-08-05 DIAGNOSIS — H401123 Primary open-angle glaucoma, left eye, severe stage: Secondary | ICD-10-CM | POA: Diagnosis not present

## 2016-08-05 DIAGNOSIS — Z961 Presence of intraocular lens: Secondary | ICD-10-CM | POA: Diagnosis not present

## 2016-08-10 ENCOUNTER — Encounter: Payer: Self-pay | Admitting: Internal Medicine

## 2016-08-10 ENCOUNTER — Ambulatory Visit (INDEPENDENT_AMBULATORY_CARE_PROVIDER_SITE_OTHER): Payer: Medicare Other | Admitting: Internal Medicine

## 2016-08-10 DIAGNOSIS — H60392 Other infective otitis externa, left ear: Secondary | ICD-10-CM

## 2016-08-10 DIAGNOSIS — H6092 Unspecified otitis externa, left ear: Secondary | ICD-10-CM | POA: Insufficient documentation

## 2016-08-10 MED ORDER — NEOMYCIN-POLYMYXIN-HC OP SUSP: 3.0000 [drp] | Freq: Three times a day (TID) | OPHTHALMIC | 0 refills | Status: DC

## 2016-08-10 NOTE — Progress Notes (Signed)
Pre visit review using our clinic review tool, if applicable. No additional management support is needed unless otherwise documented below in the visit note. 

## 2016-08-10 NOTE — Assessment & Plan Note (Signed)
Rx for corticosporin otic drops to use for 5 days. Allowed to use heat as well to help with pain or tylenol.

## 2016-08-10 NOTE — Patient Instructions (Signed)
We have sent in the ear drops for you.   Use 3 drops in the left ear 3 times per day for 5 days.   Keep the left over and use it if this comes back.

## 2016-08-10 NOTE — Progress Notes (Signed)
   Subjective:    Patient ID: Tracy Hammond, female    DOB: 08/14/1941, 75 y.o.   MRN: [id number]  HPI The patient is a 75 YO female coming in for left ear pain for about 1 week. She has tried heating pad which was temporarily helpful. She has felt feverish but no temperature. She denies drainage. Some hearing loss. Her right ear is normal. No sinus congestion, cough, SOB. Overall worsening over the last week.   Review of Systems  Constitutional: Positive for fever. Negative for activity change, appetite change, fatigue and unexpected weight change.  HENT: Positive for ear pain. Negative for congestion, ear discharge, facial swelling, postnasal drip, rhinorrhea, sinus pain, sore throat and trouble swallowing.   Eyes: Negative.   Respiratory: Negative.   Cardiovascular: Negative.   Psychiatric/Behavioral: Negative.       Objective:   Physical Exam  Constitutional: She is oriented to person, place, and time. She appears well-developed and well-nourished.  HENT:  Head: Normocephalic and atraumatic.  Right Ear: External ear normal.  Left ear pain with tugging, some fluid around the TM  Eyes: EOM are normal.  Neck: Normal range of motion.  Cardiovascular: Normal rate and regular rhythm.   Pulmonary/Chest: Effort normal. No respiratory distress. She has no wheezes. She has no rales.  Abdominal: Soft.  Lymphadenopathy:    She has no cervical adenopathy.  Neurological: She is alert and oriented to person, place, and time.  Skin: Skin is warm and dry.   Vitals:   08/10/16 1613 08/10/16 1629  BP: (!) 180/80 (!) 160/80  Pulse: 83   Resp: 16   Temp: 98.5 F (36.9 C)   TempSrc: Oral   SpO2: 95%   Weight: 170 lb (77.1 kg)   Height: 5\' 2"  (1.575 m)       Assessment & Plan:

## 2016-08-11 ENCOUNTER — Telehealth: Payer: Self-pay | Admitting: Internal Medicine

## 2016-08-11 MED ORDER — NEOMYCIN-POLYMYXIN-HC 1 % OT SOLN: 3.0000 [drp] | Freq: Three times a day (TID) | OTIC | 0 refills | Status: DC

## 2016-08-11 NOTE — Telephone Encounter (Signed)
Patient called to advise that her insurance doesn't cover [place], [place] [[id number]]  . She is asking that we prescribe something else to the same pharmacy on [address]

## 2016-08-11 NOTE — Telephone Encounter (Signed)
Sent in different formulation which should be covered.

## 2016-08-18 DIAGNOSIS — I10 Essential (primary) hypertension: Secondary | ICD-10-CM | POA: Diagnosis not present

## 2016-08-18 DIAGNOSIS — H353221 Exudative age-related macular degeneration, left eye, with active choroidal neovascularization: Secondary | ICD-10-CM | POA: Diagnosis not present

## 2016-08-18 DIAGNOSIS — H2512 Age-related nuclear cataract, left eye: Secondary | ICD-10-CM | POA: Diagnosis not present

## 2016-08-18 DIAGNOSIS — E119 Type 2 diabetes mellitus without complications: Secondary | ICD-10-CM | POA: Diagnosis not present

## 2016-08-18 DIAGNOSIS — Z961 Presence of intraocular lens: Secondary | ICD-10-CM | POA: Diagnosis not present

## 2016-08-18 DIAGNOSIS — Z7982 Long term (current) use of aspirin: Secondary | ICD-10-CM | POA: Diagnosis not present

## 2016-08-18 DIAGNOSIS — H401120 Primary open-angle glaucoma, left eye, stage unspecified: Secondary | ICD-10-CM | POA: Diagnosis not present

## 2016-08-18 DIAGNOSIS — H401123 Primary open-angle glaucoma, left eye, severe stage: Secondary | ICD-10-CM | POA: Diagnosis not present

## 2016-08-18 DIAGNOSIS — H401112 Primary open-angle glaucoma, right eye, moderate stage: Secondary | ICD-10-CM | POA: Diagnosis not present

## 2016-08-18 DIAGNOSIS — M1990 Unspecified osteoarthritis, unspecified site: Secondary | ICD-10-CM | POA: Diagnosis not present

## 2016-08-18 DIAGNOSIS — K219 Gastro-esophageal reflux disease without esophagitis: Secondary | ICD-10-CM | POA: Diagnosis not present

## 2016-08-19 DIAGNOSIS — Z7984 Long term (current) use of oral hypoglycemic drugs: Secondary | ICD-10-CM | POA: Diagnosis not present

## 2016-08-19 DIAGNOSIS — Z7982 Long term (current) use of aspirin: Secondary | ICD-10-CM | POA: Diagnosis not present

## 2016-08-19 DIAGNOSIS — F17210 Nicotine dependence, cigarettes, uncomplicated: Secondary | ICD-10-CM | POA: Diagnosis not present

## 2016-08-19 DIAGNOSIS — Z9841 Cataract extraction status, right eye: Secondary | ICD-10-CM | POA: Diagnosis not present

## 2016-08-19 DIAGNOSIS — Z961 Presence of intraocular lens: Secondary | ICD-10-CM | POA: Diagnosis not present

## 2016-08-19 DIAGNOSIS — H401114 Primary open-angle glaucoma, right eye, indeterminate stage: Secondary | ICD-10-CM | POA: Diagnosis not present

## 2016-08-19 DIAGNOSIS — Z48810 Encounter for surgical aftercare following surgery on the sense organs: Secondary | ICD-10-CM | POA: Diagnosis not present

## 2016-08-19 DIAGNOSIS — H401123 Primary open-angle glaucoma, left eye, severe stage: Secondary | ICD-10-CM | POA: Diagnosis not present

## 2016-08-19 DIAGNOSIS — I10 Essential (primary) hypertension: Secondary | ICD-10-CM | POA: Diagnosis not present

## 2016-08-19 DIAGNOSIS — E119 Type 2 diabetes mellitus without complications: Secondary | ICD-10-CM | POA: Diagnosis not present

## 2016-08-20 ENCOUNTER — Other Ambulatory Visit: Payer: Self-pay | Admitting: Internal Medicine

## 2016-09-07 ENCOUNTER — Other Ambulatory Visit (INDEPENDENT_AMBULATORY_CARE_PROVIDER_SITE_OTHER): Payer: Medicare Other

## 2016-09-07 ENCOUNTER — Ambulatory Visit (INDEPENDENT_AMBULATORY_CARE_PROVIDER_SITE_OTHER): Payer: Medicare Other | Admitting: Internal Medicine

## 2016-09-07 ENCOUNTER — Encounter: Payer: Self-pay | Admitting: Internal Medicine

## 2016-09-07 VITALS — BP 150/68 | HR 73 | Temp 98.3°F | Resp 12 | Ht 62.0 in | Wt 169.0 lb

## 2016-09-07 DIAGNOSIS — F17200 Nicotine dependence, unspecified, uncomplicated: Secondary | ICD-10-CM

## 2016-09-07 DIAGNOSIS — Z0000 Encounter for general adult medical examination without abnormal findings: Secondary | ICD-10-CM

## 2016-09-07 DIAGNOSIS — E119 Type 2 diabetes mellitus without complications: Secondary | ICD-10-CM | POA: Diagnosis not present

## 2016-09-07 DIAGNOSIS — I10 Essential (primary) hypertension: Secondary | ICD-10-CM

## 2016-09-07 DIAGNOSIS — I8390 Asymptomatic varicose veins of unspecified lower extremity: Secondary | ICD-10-CM

## 2016-09-07 DIAGNOSIS — E1169 Type 2 diabetes mellitus with other specified complication: Secondary | ICD-10-CM

## 2016-09-07 DIAGNOSIS — E785 Hyperlipidemia, unspecified: Secondary | ICD-10-CM

## 2016-09-07 LAB — COMPREHENSIVE METABOLIC PANEL
ALT: 23 U/L (ref 0–35)
AST: 28 U/L (ref 0–37)
Albumin: 4.2 g/dL (ref 3.5–5.2)
Alkaline Phosphatase: 95 U/L (ref 39–117)
BUN: 14 mg/dL (ref 6–23)
CO2: 25 meq/L (ref 19–32)
Calcium: 9.5 mg/dL (ref 8.4–10.5)
Chloride: 103 mEq/L (ref 96–112)
Creatinine, Ser: 0.44 mg/dL (ref 0.40–1.20)
GFR: 148.83 mL/min (ref >60.00)
GLUCOSE: 161 mg/dL — AB (ref 70–99)
POTASSIUM: 4.1 meq/L (ref 3.5–5.1)
SODIUM: 138 meq/L (ref 135–145)
Total Bilirubin: 0.5 mg/dL (ref 0.2–1.2)
Total Protein: 7.1 g/dL (ref 6.0–8.3)

## 2016-09-07 LAB — LIPID PANEL
CHOL/HDL RATIO: 3
Cholesterol: 141 mg/dL (ref 0–200)
HDL: 44.5 mg/dL (ref >39.00)
LDL Cholesterol: 60 mg/dL (ref 0–99)
NONHDL: 96.57
Triglycerides: 181 mg/dL — ABNORMAL HIGH (ref 0.0–149.0)
VLDL: 36.2 mg/dL (ref 0.0–40.0)

## 2016-09-07 LAB — HEMOGLOBIN A1C: Hgb A1c MFr Bld: 8.5 % — ABNORMAL HIGH (ref 4.6–6.5)

## 2016-09-07 NOTE — Progress Notes (Signed)
Pre visit review using our clinic review tool, if applicable. No additional management support is needed unless otherwise documented below in the visit note. 

## 2016-09-07 NOTE — Assessment & Plan Note (Signed)
Still smoking and no desire to quit at this time although reviewed the health risks from smoking with her today.

## 2016-09-07 NOTE — Assessment & Plan Note (Signed)
She is considering referral to vascular for some pain associated with some of her varicose veins. Left leg worse than right.

## 2016-09-07 NOTE — Assessment & Plan Note (Signed)
BP borderline today but she does not desire change to regimen. Taking fosinopril 40 mg daily. Checking CMP and adjust as needed.

## 2016-09-07 NOTE — Progress Notes (Signed)
   Subjective:    Patient ID: Tracy Hammond, female    DOB: 1940/12/07, 76 y.o.   MRN: [id number]  HPI Here for medicare wellness and physical, no new complaints. Please see A/P for status and treatment of chronic medical problems. Still smoking and does not feel able to try to quit.  Diet: DM since diabetic Physical activity: sedentary Depression/mood screen: negative Hearing: intact to whispered voice with mild loss Visual acuity: grossly normal with some poor reading vision, performs annual eye exam  ADLs: capable Fall risk: low Home safety: good Cognitive evaluation: intact to orientation, naming, recall and repetition EOL planning: adv directives discussed  I have personally reviewed and have noted 1. The patient's medical and social history - reviewed today no changes 2. Their use of alcohol, tobacco or illicit drugs 3. Their current medications and supplements 4. The patient's functional ability including ADL's, fall risks, home safety risks and hearing or visual impairment. 5. Diet and physical activities 6. Evidence for depression or mood disorders 7. Care team reviewed and updated (available in snapshot)  Review of Systems  Constitutional: Negative for activity change, appetite change, fatigue, fever and unexpected weight change.  HENT: Negative.   Eyes: Negative.   Respiratory: Negative.   Cardiovascular: Negative.   Gastrointestinal: Positive for diarrhea.       Chronic, stable  Musculoskeletal: Positive for arthralgias. Negative for back pain, gait problem, joint swelling and myalgias.  Skin: Negative.   Neurological: Negative.   Psychiatric/Behavioral: Negative.       Objective:   Physical Exam  Constitutional: She is oriented to person, place, and time. She appears well-developed and well-nourished.  Smells of tobacco  HENT:  Head: Normocephalic and atraumatic.  Right Ear: External ear normal.  Left Ear: External ear normal.  Eyes: EOM are normal.    Neck: Normal range of motion. No JVD present.  Cardiovascular: Normal rate and regular rhythm.   No carotid bruit  Pulmonary/Chest: Effort normal and breath sounds normal. No respiratory distress. She has no wheezes. She has no rales.  Abdominal: Soft. Bowel sounds are normal. She exhibits no distension. There is no tenderness. There is no rebound.  Musculoskeletal: She exhibits no edema.  Neurological: She is alert and oriented to person, place, and time. Coordination normal.  Skin: Skin is warm and dry.  Psychiatric: She has a normal mood and affect.   Vitals:   09/07/16 0837  BP: (!) 150/70  Pulse: 73  Resp: 12  Temp: 98.3 F (36.8 C)  TempSrc: Oral  SpO2: 99%  Weight: 169 lb (76.7 kg)  Height: 5\' 2"  (1.575 m)      Assessment & Plan:

## 2016-09-07 NOTE — Assessment & Plan Note (Signed)
Not taking statin. Checking lipid panel and goal LDL <100 or <70.

## 2016-09-07 NOTE — Assessment & Plan Note (Signed)
Foot exam done, eye exam up to date, checking HgA1c. Not complicated. Taking metformin daily and is on ACE-I. Checking CMP and lipid panel as well.

## 2016-09-07 NOTE — Assessment & Plan Note (Signed)
She is scheduled with GI to get colonoscopy which she is due for. Flu shot and tetanus and pneumonia up to date. Shingles is done. Counseled about sun safety and mole surveillance as well as the need for tobacco cessation and risks from smoking. Given 10 year screening recommendations at visit.

## 2016-09-07 NOTE — Patient Instructions (Signed)
We will check the labs today and call you back with the results.   Health Maintenance, Female Introduction Adopting a healthy lifestyle and getting preventive care can go a long way to promote health and wellness. Talk with your health care provider about what schedule of regular examinations is right for you. This is a good chance for you to check in with your provider about disease prevention and staying healthy. In between checkups, there are plenty of things you can do on your own. Experts have done a lot of research about which lifestyle changes and preventive measures are most likely to keep you healthy. Ask your health care provider for more information. Weight and diet Eat a healthy diet  Be sure to include plenty of vegetables, fruits, low-fat dairy products, and lean protein.  Do not eat a lot of foods high in solid fats, added sugars, or salt.  Get regular exercise. This is one of the most important things you can do for your health.  Most adults should exercise for at least 150 minutes each week. The exercise should increase your heart rate and make you sweat (moderate-intensity exercise).  Most adults should also do strengthening exercises at least twice a week. This is in addition to the moderate-intensity exercise. Maintain a healthy weight  Body mass index (BMI) is a measurement that can be used to identify possible weight problems. It estimates body fat based on height and weight. Your health care provider can help determine your BMI and help you achieve or maintain a healthy weight.  For females 20 years of age and older:  A BMI below 18.5 is considered underweight.  A BMI of 18.5 to 24.9 is normal.  A BMI of 25 to 29.9 is considered overweight.  A BMI of 30 and above is considered obese. Watch levels of cholesterol and blood lipids  You should start having your blood tested for lipids and cholesterol at 74 years of age, then have this test every 5 years.  You may  need to have your cholesterol levels checked more often if:  Your lipid or cholesterol levels are high.  You are older than 74 years of age.  You are at high risk for heart disease. Cancer screening Lung Cancer  Lung cancer screening is recommended for adults 55-80 years old who are at high risk for lung cancer because of a history of smoking.  A yearly low-dose CT scan of the lungs is recommended for people who:  Currently smoke.  Have quit within the past 15 years.  Have at least a 30-pack-year history of smoking. A pack year is smoking an average of one pack of cigarettes a day for 1 year.  Yearly screening should continue until it has been 15 years since you quit.  Yearly screening should stop if you develop a health problem that would prevent you from having lung cancer treatment. Breast Cancer  Practice breast self-awareness. This means understanding how your breasts normally appear and feel.  It also means doing regular breast self-exams. Let your health care provider know about any changes, no matter how small.  If you are in your 20s or 30s, you should have a clinical breast exam (CBE) by a health care provider every 1-3 years as part of a regular health exam.  If you are 40 or older, have a CBE every year. Also consider having a breast X-ray (mammogram) every year.  If you have a family history of breast cancer, talk to your health   care provider about genetic screening.  If you are at high risk for breast cancer, talk to your health care provider about having an MRI and a mammogram every year.  Breast cancer gene (BRCA) assessment is recommended for women who have family members with BRCA-related cancers. BRCA-related cancers include:  Breast.  Ovarian.  Tubal.  Peritoneal cancers.  Results of the assessment will determine the need for genetic counseling and BRCA1 and BRCA2 testing. Cervical Cancer  Your health care provider may recommend that you be  screened regularly for cancer of the pelvic organs (ovaries, uterus, and vagina). This screening involves a pelvic examination, including checking for microscopic changes to the surface of your cervix (Pap test). You may be encouraged to have this screening done every 3 years, beginning at age 27.  For women ages 18-65, health care providers may recommend pelvic exams and Pap testing every 3 years, or they may recommend the Pap and pelvic exam, combined with testing for human papilloma virus (HPV), every 5 years. Some types of HPV increase your risk of cervical cancer. Testing for HPV may also be done on women of any age with unclear Pap test results.  Other health care providers may not recommend any screening for nonpregnant women who are considered low risk for pelvic cancer and who do not have symptoms. Ask your health care provider if a screening pelvic exam is right for you.  If you have had past treatment for cervical cancer or a condition that could lead to cancer, you need Pap tests and screening for cancer for at least 20 years after your treatment. If Pap tests have been discontinued, your risk factors (such as having a new sexual partner) need to be reassessed to determine if screening should resume. Some women have medical problems that increase the chance of getting cervical cancer. In these cases, your health care provider may recommend more frequent screening and Pap tests. Colorectal Cancer  This type of cancer can be detected and often prevented.  Routine colorectal cancer screening usually begins at 74 years of age and continues through 74 years of age.  Your health care provider may recommend screening at an earlier age if you have risk factors for colon cancer.  Your health care provider may also recommend using home test kits to check for hidden blood in the stool.  A small camera at the end of a tube can be used to examine your colon directly (sigmoidoscopy or colonoscopy).  This is done to check for the earliest forms of colorectal cancer.  Routine screening usually begins at age 55.  Direct examination of the colon should be repeated every 5-10 years through 74 years of age. However, you may need to be screened more often if early forms of precancerous polyps or small growths are found. Skin Cancer  Check your skin from head to toe regularly.  Tell your health care provider about any new moles or changes in moles, especially if there is a change in a mole's shape or color.  Also tell your health care provider if you have a mole that is larger than the size of a pencil eraser.  Always use sunscreen. Apply sunscreen liberally and repeatedly throughout the day.  Protect yourself by wearing long sleeves, pants, a wide-brimmed hat, and sunglasses whenever you are outside. Heart disease, diabetes, and high blood pressure  High blood pressure causes heart disease and increases the risk of stroke. High blood pressure is more likely to develop in:  People  who have blood pressure in the high end of the normal range (130-139/85-89 mm Hg).  People who are overweight or obese.  People who are African American.  If you are 36-52 years of age, have your blood pressure checked every 3-5 years. If you are 1 years of age or older, have your blood pressure checked every year. You should have your blood pressure measured twice-once when you are at a hospital or clinic, and once when you are not at a hospital or clinic. Record the average of the two measurements. To check your blood pressure when you are not at a hospital or clinic, you can use:  An automated blood pressure machine at a pharmacy.  A home blood pressure monitor.  If you are between 53 years and 1 years old, ask your health care provider if you should take aspirin to prevent strokes.  Have regular diabetes screenings. This involves taking a blood sample to check your fasting blood sugar level.  If you  are at a normal weight and have a low risk for diabetes, have this test once every three years after 74 years of age.  If you are overweight and have a high risk for diabetes, consider being tested at a younger age or more often. Preventing infection Hepatitis B  If you have a higher risk for hepatitis B, you should be screened for this virus. You are considered at high risk for hepatitis B if:  You were born in a country where hepatitis B is common. Ask your health care provider which countries are considered high risk.  Your parents were born in a high-risk country, and you have not been immunized against hepatitis B (hepatitis B vaccine).  You have HIV or AIDS.  You use needles to inject street drugs.  You live with someone who has hepatitis B.  You have had sex with someone who has hepatitis B.  You get hemodialysis treatment.  You take certain medicines for conditions, including cancer, organ transplantation, and autoimmune conditions. Hepatitis C  Blood testing is recommended for:  Everyone born from [birth year] through 1963.  Anyone with known risk factors for hepatitis C. Sexually transmitted infections (STIs)  You should be screened for sexually transmitted infections (STIs) including gonorrhea and chlamydia if:  You are sexually active and are younger than 74 years of age.  You are older than 74 years of age and your health care provider tells you that you are at risk for this type of infection.  Your sexual activity has changed since you were last screened and you are at an increased risk for chlamydia or gonorrhea. Ask your health care provider if you are at risk.  If you do not have HIV, but are at risk, it may be recommended that you take a prescription medicine daily to prevent HIV infection. This is called pre-exposure prophylaxis (PrEP). You are considered at risk if:  You are sexually active and do not regularly use condoms or know the HIV status of your  partner(s).  You take drugs by injection.  You are sexually active with a partner who has HIV. Talk with your health care provider about whether you are at high risk of being infected with HIV. If you choose to begin PrEP, you should first be tested for HIV. You should then be tested every 3 months for as long as you are taking PrEP. Pregnancy  If you are premenopausal and you may become pregnant, ask your health care provider about preconception  counseling.  If you may become pregnant, take 400 to 800 micrograms (mcg) of folic acid every day.  If you want to prevent pregnancy, talk to your health care provider about birth control (contraception). Osteoporosis and menopause  Osteoporosis is a disease in which the bones lose minerals and strength with aging. This can result in serious bone fractures. Your risk for osteoporosis can be identified using a bone density scan.  If you are 40 years of age or older, or if you are at risk for osteoporosis and fractures, ask your health care provider if you should be screened.  Ask your health care provider whether you should take a calcium or vitamin D supplement to lower your risk for osteoporosis.  Menopause may have certain physical symptoms and risks.  Hormone replacement therapy may reduce some of these symptoms and risks. Talk to your health care provider about whether hormone replacement therapy is right for you. Follow these instructions at home:  Schedule regular health, dental, and eye exams.  Stay current with your immunizations.  Do not use any tobacco products including cigarettes, chewing tobacco, or electronic cigarettes.  If you are pregnant, do not drink alcohol.  If you are breastfeeding, limit how much and how often you drink alcohol.  Limit alcohol intake to no more than 1 drink per day for nonpregnant women. One drink equals 12 ounces of beer, 5 ounces of wine, or 1 ounces of hard liquor.  Do not use street  drugs.  Do not share needles.  Ask your health care provider for help if you need support or information about quitting drugs.  Tell your health care provider if you often feel depressed.  Tell your health care provider if you have ever been abused or do not feel safe at home. This information is not intended to replace advice given to you by your health care provider. Make sure you discuss any questions you have with your health care provider. Document Released: 03/02/2011 Document Revised: 01/23/2016 Document Reviewed: 05/21/2015  2017 [employer]vier

## 2016-09-08 DIAGNOSIS — H353223 Exudative age-related macular degeneration, left eye, with inactive scar: Secondary | ICD-10-CM | POA: Diagnosis not present

## 2016-09-08 DIAGNOSIS — H33101 Unspecified retinoschisis, right eye: Secondary | ICD-10-CM | POA: Diagnosis not present

## 2016-09-08 DIAGNOSIS — H353211 Exudative age-related macular degeneration, right eye, with active choroidal neovascularization: Secondary | ICD-10-CM | POA: Diagnosis not present

## 2016-09-08 DIAGNOSIS — H35423 Microcystoid degeneration of retina, bilateral: Secondary | ICD-10-CM | POA: Diagnosis not present

## 2016-09-11 ENCOUNTER — Other Ambulatory Visit: Payer: Self-pay | Admitting: Internal Medicine

## 2016-09-18 ENCOUNTER — Other Ambulatory Visit: Payer: Self-pay | Admitting: Internal Medicine

## 2016-09-21 ENCOUNTER — Other Ambulatory Visit (INDEPENDENT_AMBULATORY_CARE_PROVIDER_SITE_OTHER): Payer: Self-pay | Admitting: Orthopaedic Surgery

## 2016-09-28 ENCOUNTER — Ambulatory Visit (AMBULATORY_SURGERY_CENTER): Payer: Self-pay

## 2016-09-28 ENCOUNTER — Encounter: Payer: Self-pay | Admitting: Internal Medicine

## 2016-09-28 VITALS — Ht 61.5 in | Wt 170.0 lb

## 2016-09-28 DIAGNOSIS — Z860100 Personal history of colon polyps, unspecified: Secondary | ICD-10-CM

## 2016-09-28 NOTE — Progress Notes (Signed)
No allergies to eggs or soy No past problems with anesthesia No home oxygen No diet meds  Declined emmi 

## 2016-10-06 ENCOUNTER — Ambulatory Visit (AMBULATORY_SURGERY_CENTER): Payer: Medicare Other | Admitting: Internal Medicine

## 2016-10-06 ENCOUNTER — Encounter: Payer: Self-pay | Admitting: Internal Medicine

## 2016-10-06 VITALS — BP 140/66 | HR 68 | Temp 97.5°F | Resp 17 | Ht 61.5 in | Wt 170.0 lb

## 2016-10-06 DIAGNOSIS — D124 Benign neoplasm of descending colon: Secondary | ICD-10-CM | POA: Diagnosis not present

## 2016-10-06 DIAGNOSIS — I10 Essential (primary) hypertension: Secondary | ICD-10-CM | POA: Diagnosis not present

## 2016-10-06 DIAGNOSIS — E119 Type 2 diabetes mellitus without complications: Secondary | ICD-10-CM | POA: Diagnosis not present

## 2016-10-06 DIAGNOSIS — Z86010 Personal history of colonic polyps: Secondary | ICD-10-CM | POA: Diagnosis present

## 2016-10-06 DIAGNOSIS — D123 Benign neoplasm of transverse colon: Secondary | ICD-10-CM

## 2016-10-06 DIAGNOSIS — D122 Benign neoplasm of ascending colon: Secondary | ICD-10-CM

## 2016-10-06 MED ORDER — SODIUM CHLORIDE 0.9 % IV SOLN: 500.0000 mL | INTRAVENOUS | Status: DC

## 2016-10-06 NOTE — Progress Notes (Signed)
Pt's states no medical or surgical changes since previsit or office visit. 

## 2016-10-06 NOTE — Progress Notes (Signed)
Report given to PACU, vss 

## 2016-10-06 NOTE — Patient Instructions (Addendum)
I found and removed 3 small polyps. All look benign.  I will let you know pathology results and when to have another routine colonoscopy by mail.  I appreciate the opportunity to care for you. Gatha Mayer, MD, [facility]   Impression/Recommendations:  Polyp  Handout given to patient. Diverticulosis handout given to patient.  Repeat colonoscopy recommended for surveillance.   Date to be determined based on pathology results.  YOU HAD AN ENDOSCOPIC PROCEDURE TODAY AT [facility]:   Refer to the procedure report that was given to you for any specific questions about what was found during the examination.  If the procedure report does not answer your questions, please call your gastroenterologist to clarify.  If you requested that your care partner not be given the details of your procedure findings, then the procedure report has been included in a sealed envelope for you to review at your convenience later.  YOU SHOULD EXPECT: Some feelings of bloating in the abdomen. Passage of more gas than usual.  Walking can help get rid of the air that was put into your GI tract during the procedure and reduce the bloating. If you had a lower endoscopy (such as a colonoscopy or flexible sigmoidoscopy) you may notice spotting of blood in your stool or on the toilet paper. If you underwent a bowel prep for your procedure, you may not have a normal bowel movement for a few days.  Please Note:  You might notice some irritation and congestion in your nose or some drainage.  This is from the oxygen used during your procedure.  There is no need for concern and it should clear up in a day or so.  SYMPTOMS TO REPORT IMMEDIATELY:   Following lower endoscopy (colonoscopy or flexible sigmoidoscopy):  Excessive amounts of blood in the stool  Significant tenderness or worsening of abdominal pains  Swelling of the abdomen that is new, acute  Fever of 100F or higher For urgent or emergent  issues, a gastroenterologist can be reached at any hour by calling [phone].   DIET:  We do recommend a small meal at first, but then you may proceed to your regular diet.  Drink plenty of fluids but you should avoid alcoholic beverages for 24 hours.  ACTIVITY:  You should plan to take it easy for the rest of today and you should NOT DRIVE or use heavy machinery until tomorrow (because of the sedation medicines used during the test).    FOLLOW UP: Our staff will call the number listed on your records the next business day following your procedure to check on you and address any questions or concerns that you may have regarding the information given to you following your procedure. If we do not reach you, we will leave a message.  However, if you are feeling well and you are not experiencing any problems, there is no need to return our call.  We will assume that you have returned to your regular daily activities without incident.  If any biopsies were taken you will be contacted by phone or by letter within the next 1-3 weeks.  Please call us at [phone] if you have not heard about the biopsies in 3 weeks.    SIGNATURES/CONFIDENTIALITY: You and/or your care partner have signed paperwork which will be entered into your electronic medical record.  These signatures attest to the fact that that the information above on your After Visit Summary has been reviewed and is understood.  Full responsibility of the confidentiality of this discharge information lies with you and/or your care-partner.

## 2016-10-06 NOTE — Op Note (Signed)
[facility] Patient Name: Tracy Hammond Procedure Date: 10/06/2016 8:28 AM MRN: [id number] Endoscopist: Gatha Mayer , MD Age: 76 Referring MD:  Date of Birth: 1940-11-07 Gender: Female Account #: [id number] Procedure:                Colonoscopy Indications:              Surveillance: Personal history of adenomatous                            polyps on last colonoscopy 3 years ago Medicines:                Propofol per Anesthesia, Monitored Anesthesia Care Procedure:                Pre-Anesthesia Assessment:                           - Prior to the procedure, a History and Physical                            was performed, and patient medications and                            allergies were reviewed. The patient's tolerance of                            previous anesthesia was also reviewed. The risks                            and benefits of the procedure and the sedation                            options and risks were discussed with the patient.                            All questions were answered, and informed consent                            was obtained. Prior Anticoagulants: The patient has                            taken no previous anticoagulant or antiplatelet                            agents. ASA Grade Assessment: II - A patient with                            mild systemic disease. After reviewing the risks                            and benefits, the patient was deemed in                            satisfactory condition to undergo the procedure.  After obtaining informed consent, the colonoscope                            was passed under direct vision. Throughout the                            procedure, the patient's blood pressure, pulse, and                            oxygen saturations were monitored continuously. The                            Model CF-HQ190L [id number]) scope was introduced   through the anus and advanced to the the cecum,                            identified by appendiceal orifice and ileocecal                            valve. The colonoscopy was performed without                            difficulty. The patient tolerated the procedure                            well. The quality of the bowel preparation was                            adequate. The bowel preparation used was Miralax.                            The ileocecal valve, appendiceal orifice, and                            rectum were photographed. Scope In: 8:34:27 AM Scope Out: 8:48:35 AM Scope Withdrawal Time: 0 hours 12 minutes 24 seconds  Total Procedure Duration: 0 hours 14 minutes 8 seconds  Findings:                 The perianal and digital rectal examinations were                            normal.                           Two sessile polyps were found in the descending                            colon and transverse colon. The polyps were 4 to 6                            mm in size. These polyps were removed with a cold                            snare. Resection and  retrieval were complete.                            Verification of patient identification for the                            specimen was done. Estimated blood loss was minimal.                           A 2 mm polyp was found in the ascending colon. The                            polyp was sessile. The polyp was removed with a                            cold biopsy forceps. Resection and retrieval were                            complete. Verification of patient identification                            for the specimen was done. Estimated blood loss was                            minimal.                           Many small and large-mouthed diverticula were found                            in the sigmoid colon.                           The exam was otherwise without abnormality on                            direct  and retroflexion views. Complications:            No immediate complications. Estimated Blood Loss:     Estimated blood loss was minimal. Impression:               - Two 4 to 6 mm polyps in the descending colon and                            in the transverse colon, removed with a cold snare.                            Resected and retrieved.                           - One 2 mm polyp in the ascending colon, removed                            with a cold biopsy forceps. Resected and retrieved.                           -  Diverticulosis in the sigmoid colon.                           - The examination was otherwise normal on direct                            and retroflexion views.                           - Personal history of colonic polyps. Adenomas one                            > 10 mm late 2014 Recommendation:           - Patient has a contact number available for                            emergencies. The signs and symptoms of potential                            delayed complications were discussed with the                            patient. Return to normal activities tomorrow.                            Written discharge instructions were provided to the                            patient.                           - Resume previous diet.                           - Continue present medications.                           - Repeat colonoscopy is recommended for                            surveillance. The colonoscopy date will be                            determined after pathology results from today's                            exam become available for review. Gatha Mayer, MD 10/06/2016 9:00:59 AM This report has been signed electronically.

## 2016-10-06 NOTE — Progress Notes (Signed)
Called to room to assist during endoscopic procedure.  Patient ID and intended procedure confirmed with present staff. Received instructions for my participation in the procedure from the performing physician.  

## 2016-10-07 ENCOUNTER — Telehealth: Payer: Self-pay

## 2016-10-07 NOTE — Telephone Encounter (Signed)
  Follow up Call-  Call back number 10/06/2016  Post procedure Call Back phone  # [phone]  Permission to leave phone message Yes  Some recent data might be hidden     Patient questions:  Do you have a fever, pain , or abdominal swelling? Yes.   Pain Score  0 *  Have you tolerated food without any problems? Yes.    Have you been able to return to your normal activities? Yes.    Do you have any questions about your discharge instructions: Diet   No. Medications  No. Follow up visit  No.  Do you have questions or concerns about your Care? No.  Actions: * If pain score is 4 or above: No action needed, pain <4.  Pt reported she did not have a BM this am.  I explained it may take a few days till she gets back to regular BM d/t the prep. maw

## 2016-10-13 ENCOUNTER — Encounter: Payer: Self-pay | Admitting: Internal Medicine

## 2016-10-13 DIAGNOSIS — Z86010 Personal history of colonic polyps: Secondary | ICD-10-CM

## 2016-10-13 NOTE — Progress Notes (Signed)
3 diminutive adenomas - recall 2021

## 2016-11-13 ENCOUNTER — Ambulatory Visit (INDEPENDENT_AMBULATORY_CARE_PROVIDER_SITE_OTHER): Payer: Medicare Other | Admitting: Internal Medicine

## 2016-11-13 ENCOUNTER — Encounter: Payer: Self-pay | Admitting: Internal Medicine

## 2016-11-13 DIAGNOSIS — R05 Cough: Secondary | ICD-10-CM | POA: Diagnosis not present

## 2016-11-13 DIAGNOSIS — F17200 Nicotine dependence, unspecified, uncomplicated: Secondary | ICD-10-CM | POA: Diagnosis not present

## 2016-11-13 DIAGNOSIS — J410 Simple chronic bronchitis: Secondary | ICD-10-CM | POA: Insufficient documentation

## 2016-11-13 DIAGNOSIS — R059 Cough, unspecified: Secondary | ICD-10-CM

## 2016-11-13 MED ORDER — HYDROCODONE-HOMATROPINE 5-1.5 MG/5ML PO SYRP: 5.0000 mL | ORAL_SOLUTION | Freq: Three times a day (TID) | ORAL | 0 refills | Status: DC | PRN

## 2016-11-13 NOTE — Assessment & Plan Note (Signed)
Likely due to viral cold/ allergies. She is advised to try zyrtec over the counter. Given rx for hycodan for the coughing. No indication for antibiotics or steroids. Warned that her smoking ongoing can cause this to lengthen and not resolve as well or quickly.

## 2016-11-13 NOTE — Patient Instructions (Signed)
We have given you the cough medicine to use as needed.   Start taking zyrtec (also called cetirizine) over the counter once a day for the next 1-2 weeks.   Call us back next week if this is not getting better.

## 2016-11-13 NOTE — Assessment & Plan Note (Signed)
Advised that her smoking will make symptoms last longer and can cause cough to worsen with cold/allergies. She is aware and not sure she wants to try quitting now although we discussed risks of smoking and benefits of quitting.

## 2016-11-13 NOTE — Progress Notes (Signed)
Pre visit review using our clinic review tool, if applicable. No additional management support is needed unless otherwise documented below in the visit note. 

## 2016-11-13 NOTE — Progress Notes (Signed)
   Subjective:    Patient ID: Tracy Hammond, female    DOB: 09-22-1940, 76 y.o.   MRN: [id number]  HPI The patient is a 76 YO female coming in for 4 days of cough and congestion. She is a smoker and is not able to smoke as much lately due to coughing. She is taking tussin and mucinex and they are not helping much. She denies fevers or chills. She denies SOB with exertion and has not needed her albuterol inhaler at all. Some mild production of the cough and nasal congestion and drainage. Overall worsening since onset. Not taking any allergy medication.   Review of Systems  Constitutional: Positive for fatigue. Negative for activity change, appetite change, chills, fever and unexpected weight change.  HENT: Positive for congestion, postnasal drip, rhinorrhea and sore throat. Negative for ear discharge, ear pain, mouth sores, nosebleeds, sinus pain, sinus pressure, trouble swallowing and voice change.   Eyes: Negative.   Respiratory: Positive for cough. Negative for chest tightness, shortness of breath and wheezing.   Cardiovascular: Negative.   Gastrointestinal: Negative.       Objective:   Physical Exam  Constitutional: She is oriented to person, place, and time. She appears well-developed and well-nourished.  HENT:  Head: Normocephalic and atraumatic.  Oropharynx with redness and clear drainage, no nasal crusting.   Eyes: EOM are normal.  Neck: Normal range of motion. No JVD present.  Cardiovascular: Normal rate and regular rhythm.   Pulmonary/Chest: Effort normal. No respiratory distress. She has no wheezes. She has no rales.  Some scattered rhonchi which clear with cough  Abdominal: Soft.  Lymphadenopathy:    She has no cervical adenopathy.  Neurological: She is alert and oriented to person, place, and time.  Skin: Skin is warm and dry.   Vitals:   11/13/16 0843  BP: 140/78  Pulse: 81  Temp: 98.1 F (36.7 C)  TempSrc: Oral  SpO2: 96%  Weight: 167 lb 12 oz (76.1 kg)    Height: 5' 1.5" (1.562 m)      Assessment & Plan:

## 2016-11-19 ENCOUNTER — Telehealth: Payer: Self-pay | Admitting: Internal Medicine

## 2016-11-19 NOTE — Telephone Encounter (Signed)
Patient contacted and aware                                                                                                          

## 2016-11-19 NOTE — Telephone Encounter (Signed)
There is not a stronger cough medicine but she can try tussin over the counter. The cough will not disappear for several weeks still likely.

## 2016-11-19 NOTE — Telephone Encounter (Signed)
Pt called stating medicine prescribed last Friday is not working and is aso taking Zyrtec and is still achy and coughing. Would like something else called in.

## 2016-11-20 ENCOUNTER — Other Ambulatory Visit: Payer: Self-pay | Admitting: Internal Medicine

## 2016-11-23 DIAGNOSIS — Z9842 Cataract extraction status, left eye: Secondary | ICD-10-CM | POA: Diagnosis not present

## 2016-11-23 DIAGNOSIS — H401114 Primary open-angle glaucoma, right eye, indeterminate stage: Secondary | ICD-10-CM | POA: Diagnosis not present

## 2016-11-23 DIAGNOSIS — Z961 Presence of intraocular lens: Secondary | ICD-10-CM | POA: Diagnosis not present

## 2016-11-23 DIAGNOSIS — H401123 Primary open-angle glaucoma, left eye, severe stage: Secondary | ICD-10-CM | POA: Diagnosis not present

## 2016-11-26 ENCOUNTER — Telehealth: Payer: Self-pay | Admitting: Internal Medicine

## 2016-11-26 DIAGNOSIS — I8310 Varicose veins of unspecified lower extremity with inflammation: Secondary | ICD-10-CM

## 2016-11-26 NOTE — Telephone Encounter (Signed)
Pt called stating  she would like tto be referred to a vein specialists, [facility], she has a vein at her ankle that has turned black. She has varicose veins all over her body. Please advise.

## 2016-11-30 ENCOUNTER — Other Ambulatory Visit: Payer: Self-pay | Admitting: Internal Medicine

## 2016-11-30 NOTE — Telephone Encounter (Signed)
Called pt no answer LMOM MD has ok referral, and will be contacted by out [facility] once appt has been made/LMB

## 2016-11-30 NOTE — Telephone Encounter (Signed)
Referral placed.

## 2016-12-01 ENCOUNTER — Other Ambulatory Visit: Payer: Self-pay

## 2016-12-01 DIAGNOSIS — I8312 Varicose veins of left lower extremity with inflammation: Principal | ICD-10-CM

## 2016-12-01 DIAGNOSIS — I8311 Varicose veins of right lower extremity with inflammation: Secondary | ICD-10-CM

## 2016-12-08 DIAGNOSIS — H353223 Exudative age-related macular degeneration, left eye, with inactive scar: Secondary | ICD-10-CM | POA: Diagnosis not present

## 2016-12-08 DIAGNOSIS — H33101 Unspecified retinoschisis, right eye: Secondary | ICD-10-CM | POA: Diagnosis not present

## 2016-12-08 DIAGNOSIS — H35423 Microcystoid degeneration of retina, bilateral: Secondary | ICD-10-CM | POA: Diagnosis not present

## 2016-12-08 DIAGNOSIS — H353211 Exudative age-related macular degeneration, right eye, with active choroidal neovascularization: Secondary | ICD-10-CM | POA: Diagnosis not present

## 2016-12-28 DIAGNOSIS — H401123 Primary open-angle glaucoma, left eye, severe stage: Secondary | ICD-10-CM | POA: Diagnosis not present

## 2016-12-28 DIAGNOSIS — H401114 Primary open-angle glaucoma, right eye, indeterminate stage: Secondary | ICD-10-CM | POA: Diagnosis not present

## 2017-01-11 ENCOUNTER — Encounter: Payer: Self-pay | Admitting: Vascular Surgery

## 2017-01-13 ENCOUNTER — Ambulatory Visit (HOSPITAL_COMMUNITY)
Admission: RE | Admit: 2017-01-13 | Discharge: 2017-01-13 | Disposition: A | Discharge status: Home / Self Care | Payer: Medicare Other | Source: Ambulatory Visit | Attending: Vascular Surgery | Admitting: Vascular Surgery

## 2017-01-13 ENCOUNTER — Ambulatory Visit (INDEPENDENT_AMBULATORY_CARE_PROVIDER_SITE_OTHER): Payer: Medicare Other | Admitting: Vascular Surgery

## 2017-01-13 ENCOUNTER — Encounter: Payer: Self-pay | Admitting: Vascular Surgery

## 2017-01-13 VITALS — BP 153/73 | HR 74 | Temp 98.5°F | Resp 18 | Ht 61.5 in | Wt 166.5 lb

## 2017-01-13 DIAGNOSIS — I83893 Varicose veins of bilateral lower extremities with other complications: Secondary | ICD-10-CM | POA: Diagnosis not present

## 2017-01-13 DIAGNOSIS — I8312 Varicose veins of left lower extremity with inflammation: Secondary | ICD-10-CM | POA: Diagnosis not present

## 2017-01-13 DIAGNOSIS — M7989 Other specified soft tissue disorders: Secondary | ICD-10-CM | POA: Insufficient documentation

## 2017-01-13 DIAGNOSIS — I8311 Varicose veins of right lower extremity with inflammation: Secondary | ICD-10-CM | POA: Insufficient documentation

## 2017-01-13 NOTE — Progress Notes (Signed)
[facility] [place]  Patient name: Tracy Hammond MRN: [id number] DOB: 30-Jan-1941 Sex: female  REASON FOR CONSULT: Evaluation of bilateral lower from any venous pathology  HPI: Tracy Hammond is a 76 y.o. female, who is here today for evaluation of the painful varicosities of her lower extremity. This is more severe in her left thigh and calf than on the right side. She is concerned due to prior family history of sister having a vascular issues resulting in bilateral amputations. She reports that she was evaluated by [facility] radiology several years ago and was told that she did not have any treatable condition and was instructed to wear compression garments. These have given her some relief in the past but she is has difficulty placing these. She does have difficulty with macular degeneration also hearing loss. She is here today with her husband. She does have a raised telangiectasia over her ankles more so on the left medial ankle but does not have any bleeding from these. She does not have any history of DVT. Does have some swelling with prolonged standing. Reports it is difficult for her to sleep on her side due to the discomfort related to these varicosities.  Past Medical History:  Diagnosis Date  . Allergy    seasonal  . Arthritis    fingers  . Bursitis   . Cancer ([place])    skin cancer arm and face  . Diabetes mellitus   . High cholesterol   . Hypertension   . Personal history of colonic adenoma 06/12/2008  . Thyroid disease    young adult-no meds now    Family History  Problem Relation Age of Onset  . Hypertension Father        family hx  . Diabetes Mother   . Kidney failure Mother   . Heart disease Mother   . Stroke Sister   . Other Sister        Leg Disease  . Heart disease Son   . Colon cancer Neg Hx   . Esophageal cancer Neg Hx   . Rectal cancer Neg Hx   . Stomach cancer Neg Hx     SOCIAL  HISTORY: Social History   Social History  . Marital status: Married    Spouse name: N/A  . Number of children: 3  . Years of education: N/A   Occupational History  . housewife    Social History Main Topics  . Smoking status: Current Every Day Smoker    Packs/day: 0.50    Years: 60.00    Types: Cigarettes  . Smokeless tobacco: Never Used  . Alcohol use No  . Drug use: No  . Sexual activity: Not on file   Other Topics Concern  . Not on file   Social History Narrative   The patient is married. 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 and 2 daughters   3 caffeinated beverages daily   06/19/2013          No Known Allergies  Current Outpatient Prescriptions  Medication Sig Dispense Refill  . albuterol (PROVENTIL HFA;VENTOLIN HFA) 108 (90 Base) MCG/ACT inhaler Inhale 2 puffs into the lungs every 6 (six) hours as needed for wheezing or shortness of breath. 1 Inhaler 2  . aspirin 81 MG tablet Take 1 tablet (81 mg total) by mouth every morning. STOP TODAY 11/25, RESTART 08/08/2013 30 tablet   . brimonidine (ALPHAGAN) 0.15 % ophthalmic solution   1  . Calcium Carbonate-Vit D-Min (  CALCIUM 1200) 1200-1000 MG-UNIT CHEW Chew 1 tablet by mouth daily as needed. For heartburn    . diclofenac sodium (VOLTAREN) 1 % GEL APPLY A THIN LAYER TO AFFECTED AREA UP TO 4 TIMES A DAY AS NEEDED 3 Tube 1  . dorzolamide (TRUSOPT) 2 % ophthalmic solution   0  . fosinopril (MONOPRIL) 40 MG tablet TAKE ONE TABLET BY MOUTH ONCE DAILY 90 tablet 1  . HYDROcodone-homatropine (HYCODAN) 5-1.5 MG/5ML syrup Take 5 mLs by mouth every 8 (eight) hours as needed for cough. 120 mL 0  . MAGNESIUM PO Take 500 mg by mouth.    . metFORMIN (GLUCOPHAGE) 500 MG tablet TAKE 1 TAB ONCE A DAY WITH BREAKFAST 90 tablet 2  . Multiple Vitamin (MULTIVITAMIN WITH MINERALS) TABS Take 1 tablet by mouth every morning.    Marland Kitchen NEOMYCIN-POLYMYXIN-HYDROCORTISONE (CORTISPORIN) 1 % SOLN otic solution Place 3 drops into both ears 3 (three) times daily. 10 mL  0  . rOPINIRole (REQUIP) 2 MG tablet TAKE ONE TABLET BY MOUTH ONCE DAILY 90 tablet 1  . timolol (TIMOPTIC) 0.5 % ophthalmic solution   0  . tiZANidine (ZANAFLEX) 2 MG tablet TAKE ONE TABLET BY MOUTH AT BEDTIME 90 tablet 1  . tobramycin (TOBREX) 0.3 % ophthalmic solution Place 1 drop into the right eye every 4 (four) hours as needed.     . lansoprazole (PREVACID) 30 MG capsule Take 30 mg by mouth every morning.     No current facility-administered medications for this visit.     REVIEW OF SYSTEMS:  [X]  denotes positive finding, [ ]  denotes negative finding Cardiac  Comments:  Chest pain or chest pressure:    Shortness of breath upon exertion:    Short of breath when lying flat:    Irregular heart rhythm:        Vascular    Pain in calf, thigh, or hip brought on by ambulation: x Arthritis   Pain in feet at night that wakes you up from your sleep:  x   Blood clot in your veins:    Leg swelling:  x       Pulmonary    Oxygen at home:    Productive cough:  x   Wheezing:  x       Neurologic    Sudden weakness in arms or legs:     Sudden numbness in arms or legs:     Sudden onset of difficulty speaking or slurred speech:    Temporary loss of vision in one eye:     Problems with dizziness:         Gastrointestinal    Blood in stool:     Vomited blood:         Genitourinary    Burning when urinating:     Blood in urine:        Psychiatric    Major depression:  x       Hematologic    Bleeding problems:    Problems with blood clotting too easily:        Skin    Rashes or ulcers:        Constitutional    Fever or chills:      PHYSICAL EXAM: Vitals:   01/13/17 1414 01/13/17 1415  BP: (!) 155/76 (!) 153/73  Pulse: 74   Resp: 18   Temp: 98.5 F (36.9 C)   TempSrc: Oral   SpO2: 94%   Weight: 166 lb 8 oz (75.5 kg)   Height:  5' 1.5" (1.562 m)     GENERAL: The patient is a well-nourished female, in no acute distress. The vital signs are documented  above. CARDIOVASCULAR: 2+ radial and 2+ dorsalis pedis pulses bilaterally. Varices of bilaterally most prominently in her anterior thigh extending into her medial calf on the left PULMONARY: There is good air exchange  ABDOMEN: Soft and non-tender  MUSCULOSKELETAL: There are no major deformities or cyanosis. NEUROLOGIC: No focal weakness or paresthesias are detected. SKIN: There are no ulcers or rashes noted. Marked telangiectasia over both lower from his more so on the left on her ankle extending onto her foot. PSYCHIATRIC: The patient has a normal affect.  DATA:  Noninvasive studies reveal no enlargement of some reflux in her right great saphenous vein. She does have reflux at the common femoral vein on the left but no saphenous reflux and nonvisualization of the saphenous vein on the left.  MEDICAL ISSUES: Patient has painful varicosities most prominently in her left anterior thigh and calf. I reassured her that this is not limb threatening. Also explained that the treatment of this would be stab phlebectomy of these multiple large tributary varicosities. Explained that most commonly patients would require some type of ablation procedure as well. In her case there is no communication with a refluxing saphenous vein she could be treated with stab phlebectomy only. I did explain that occasionally insurance carriers will not cover this is an isolated procedure and that we would talk with her care to determine if this was a covered procedure. I do feel that she would have excellent relief with this. We will communicate this with her further as we have the obtain preapproval   Rosetta Posner, MD [facility] Tel [phone] Pager [phone]

## 2017-02-01 ENCOUNTER — Encounter: Payer: Self-pay | Admitting: Vascular Surgery

## 2017-02-04 ENCOUNTER — Encounter: Payer: Self-pay | Admitting: Vascular Surgery

## 2017-02-04 ENCOUNTER — Ambulatory Visit (INDEPENDENT_AMBULATORY_CARE_PROVIDER_SITE_OTHER): Payer: Medicare Other | Admitting: Vascular Surgery

## 2017-02-04 VITALS — BP 180/73 | HR 67 | Temp 97.6°F | Resp 16 | Ht 62.0 in | Wt 166.0 lb

## 2017-02-04 DIAGNOSIS — I83893 Varicose veins of bilateral lower extremities with other complications: Secondary | ICD-10-CM

## 2017-02-04 DIAGNOSIS — I868 Varicose veins of other specified sites: Secondary | ICD-10-CM

## 2017-02-04 NOTE — Progress Notes (Signed)
    Stab Phlebectomy Procedure  Tracy Hammond Samples DOB:May 18, 1941  02/04/2017  Consent signed: Yes  Surgeon:T.F. Abram Sax, MD  Procedure: stab phlebectomy: left leg  BP (!) 180/73   Pulse 67   Temp 97.6 F (36.4 C)   Resp 16   Ht 5\' 2"  (1.575 m)   Wt 166 lb (75.3 kg)   SpO2 98%   BMI 30.36 kg/m   Start time: 8:30   End time: 9:40   Tumescent Anesthesia: 475 cc 0.9% NaCl with 50 cc Lidocaine HCL with 1% Epi and 15 cc 8.4% NaHCO3  Local Anesthesia: 2 cc Lidocaine HCL and NaHCO3 (ratio 2:1)    Stab Phlebectomy: >20 Sites: Thigh and Calf  Patient tolerated procedure well: Yes  Notes:   Description of Procedure:  After marking the course of the secondary varicosities, the patient was placed on the operating table in the supine position, and the left leg was prepped and draped in sterile fashion.    The patient was then put into Trendelenburg position.  Local anesthetic was administered at the previously marked varicosities, and tumescent anesthesia was administered around the vessels.  Greater than 20 stab wounds were made using the tip of an 11 blade. And using the vein hook, the phlebectomies were performed using a hemostat to avulse the varicosities.  Adequate hemostasis was achieved, and steri strips were applied to the stab wound.      ABD pads and thigh high compression stockings were applied as well ace wraps where needed. Blood loss was less than 15 cc.  The patient ambulated out of the operating room having tolerated the procedure well.  Uneventful phlebectomy of large painful varicosities throughout her medial thigh and medial calf and also in her lateral calf. She has large telangiectasia which are causing her discomfort which are raised above the surface down onto the more distal calf and onto the dorsum of her foot. I explained that this is not amenable to stab phlebectomy. Would recommend sclerotherapy treatment for this. Explained would require 2 units of sclerotherapy  to address these painful telangiectasia. I will see her again in 3 weeks for follow-up of today's treatment.

## 2017-02-05 ENCOUNTER — Encounter: Payer: Self-pay | Admitting: Vascular Surgery

## 2017-02-12 ENCOUNTER — Encounter: Payer: Self-pay | Admitting: Vascular Surgery

## 2017-02-23 DIAGNOSIS — H35423 Microcystoid degeneration of retina, bilateral: Secondary | ICD-10-CM | POA: Diagnosis not present

## 2017-02-23 DIAGNOSIS — H35371 Puckering of macula, right eye: Secondary | ICD-10-CM | POA: Diagnosis not present

## 2017-02-23 DIAGNOSIS — H353223 Exudative age-related macular degeneration, left eye, with inactive scar: Secondary | ICD-10-CM | POA: Diagnosis not present

## 2017-02-23 DIAGNOSIS — H353211 Exudative age-related macular degeneration, right eye, with active choroidal neovascularization: Secondary | ICD-10-CM | POA: Diagnosis not present

## 2017-02-25 ENCOUNTER — Encounter: Payer: Self-pay | Admitting: Vascular Surgery

## 2017-02-25 ENCOUNTER — Ambulatory Visit (INDEPENDENT_AMBULATORY_CARE_PROVIDER_SITE_OTHER): Payer: Self-pay | Admitting: Vascular Surgery

## 2017-02-25 VITALS — BP 156/71 | HR 73 | Temp 98.4°F | Resp 16 | Ht 60.5 in | Wt 166.0 lb

## 2017-02-25 DIAGNOSIS — I83893 Varicose veins of bilateral lower extremities with other complications: Secondary | ICD-10-CM

## 2017-02-25 NOTE — Progress Notes (Signed)
Vitals:   02/25/17 0947  BP: (!) 170/73  Pulse: 74  Resp: 16  Temp: 98.4 F (36.9 C)  TempSrc: Oral  SpO2: 97%  Weight: 166 lb (75.3 kg)  Height: 5' 0.5" (1.537 m)

## 2017-02-25 NOTE — Progress Notes (Signed)
   Patient name: Tracy Hammond MRN: [id number] DOB: [birth year] Sex: female  REASON FOR VISIT: Here today for follow-up of stab phlebectomy of multiple large tributary varicosities throughout her left leg. She did very well with mild to moderate bruising in this is resolved as is her soreness. He does have some areas of collection of blood in the tunnel left from the phlebectomy. No evidence of inflammation.    Current Outpatient Prescriptions  Medication Sig Dispense Refill  . albuterol (PROVENTIL HFA;VENTOLIN HFA) 108 (90 Base) MCG/ACT inhaler Inhale 2 puffs into the lungs every 6 (six) hours as needed for wheezing or shortness of breath. 1 Inhaler 2  . aspirin 81 MG tablet Take 1 tablet (81 mg total) by mouth every morning. STOP TODAY 11/25, RESTART 08/08/2013 30 tablet   . brimonidine (ALPHAGAN) 0.15 % ophthalmic solution   1  . Calcium Carbonate-Vit D-Min (CALCIUM 1200) 1200-1000 MG-UNIT CHEW Chew 1 tablet by mouth daily as needed. For heartburn    . diclofenac sodium (VOLTAREN) 1 % GEL APPLY A THIN LAYER TO AFFECTED AREA UP TO 4 TIMES A DAY AS NEEDED 3 Tube 1  . dorzolamide (TRUSOPT) 2 % ophthalmic solution   0  . fosinopril (MONOPRIL) 40 MG tablet TAKE ONE TABLET BY MOUTH ONCE DAILY 90 tablet 1  . HYDROcodone-homatropine (HYCODAN) 5-1.5 MG/5ML syrup Take 5 mLs by mouth every 8 (eight) hours as needed for cough. 120 mL 0  . MAGNESIUM PO Take 500 mg by mouth.    . metFORMIN (GLUCOPHAGE) 500 MG tablet TAKE 1 TAB ONCE A DAY WITH BREAKFAST 90 tablet 2  . Multiple Vitamin (MULTIVITAMIN WITH MINERALS) TABS Take 1 tablet by mouth every morning.    Marland Kitchen NEOMYCIN-POLYMYXIN-HYDROCORTISONE (CORTISPORIN) 1 % SOLN otic solution Place 3 drops into both ears 3 (three) times daily. 10 mL 0  . rOPINIRole (REQUIP) 2 MG tablet TAKE ONE TABLET BY MOUTH ONCE DAILY 90 tablet 1  . timolol (TIMOPTIC) 0.5 % ophthalmic solution   0  . tiZANidine (ZANAFLEX) 2 MG tablet TAKE ONE TABLET  BY MOUTH AT BEDTIME 90 tablet 1  . tobramycin (TOBREX) 0.3 % ophthalmic solution Place 1 drop into the right eye every 4 (four) hours as needed.     . lansoprazole (PREVACID) 30 MG capsule Take 30 mg by mouth every morning.     No current facility-administered medications for this visit.      PHYSICAL EXAM: Vitals:   02/25/17 0947 02/25/17 0950  BP: (!) 170/73 (!) 156/71  Pulse: 74 73  Resp: 16   Temp: 98.4 F (36.9 C)   TempSrc: Oral   SpO2: 97%   Weight: 166 lb (75.3 kg)   Height: 5' 0.5" (1.537 m)     GENERAL: The patient is a well-nourished female, in no acute distress. The vital signs are documented above. All stab phlebectomy is are healing with some thrombus under the skin. No significant swelling  MEDICAL ISSUES: Stable overall with stab phlebectomy of multiple large painful varicosities. She does have large painful telangiectasia which are raised above her skin over her ankle and distal calf. She will return on 03/10/2017 for sclerotherapy of these painful raised telangiectasia.   Rosetta Posner, MD FACS [facility] of [facility] Tel [phone] Pager [phone]

## 2017-03-01 ENCOUNTER — Encounter: Payer: Self-pay | Admitting: *Deleted

## 2017-03-04 ENCOUNTER — Other Ambulatory Visit: Payer: Self-pay | Admitting: Internal Medicine

## 2017-03-10 ENCOUNTER — Ambulatory Visit (INDEPENDENT_AMBULATORY_CARE_PROVIDER_SITE_OTHER): Payer: Medicare Other | Admitting: *Deleted

## 2017-03-10 DIAGNOSIS — I83893 Varicose veins of bilateral lower extremities with other complications: Secondary | ICD-10-CM | POA: Diagnosis not present

## 2017-03-10 NOTE — Progress Notes (Signed)
X=.3% Sotradecol administered with a 27g butterfly.  Patient received a total of 18cc.  Easy access. Tol well. Anticipate good results. She has one more ins covered tx. Follow prn.   Compression stockings applied: Yes.

## 2017-03-15 ENCOUNTER — Other Ambulatory Visit: Payer: Self-pay | Admitting: Internal Medicine

## 2017-03-15 ENCOUNTER — Encounter: Payer: Self-pay | Admitting: *Deleted

## 2017-03-17 NOTE — Telephone Encounter (Signed)
Please advise 

## 2017-04-13 ENCOUNTER — Encounter: Payer: Self-pay | Admitting: *Deleted

## 2017-04-19 DIAGNOSIS — H401123 Primary open-angle glaucoma, left eye, severe stage: Secondary | ICD-10-CM | POA: Diagnosis not present

## 2017-04-19 DIAGNOSIS — H401114 Primary open-angle glaucoma, right eye, indeterminate stage: Secondary | ICD-10-CM | POA: Diagnosis not present

## 2017-04-21 ENCOUNTER — Ambulatory Visit (INDEPENDENT_AMBULATORY_CARE_PROVIDER_SITE_OTHER): Payer: Medicare Other | Admitting: *Deleted

## 2017-04-21 DIAGNOSIS — I83893 Varicose veins of bilateral lower extremities with other complications: Secondary | ICD-10-CM | POA: Diagnosis not present

## 2017-04-21 NOTE — Progress Notes (Signed)
X=.3% Sotradecol administered with a 27g butterfly.  Patient received a total of 12cc.  Treated any remaining open vessels. Easy access. Tol well. Anticipate good results. Follow prn.    Compression stockings applied: Yes.

## 2017-05-05 DIAGNOSIS — H353211 Exudative age-related macular degeneration, right eye, with active choroidal neovascularization: Secondary | ICD-10-CM | POA: Diagnosis not present

## 2017-05-05 DIAGNOSIS — H35423 Microcystoid degeneration of retina, bilateral: Secondary | ICD-10-CM | POA: Diagnosis not present

## 2017-05-05 DIAGNOSIS — H35371 Puckering of macula, right eye: Secondary | ICD-10-CM | POA: Diagnosis not present

## 2017-05-05 DIAGNOSIS — H353223 Exudative age-related macular degeneration, left eye, with inactive scar: Secondary | ICD-10-CM | POA: Diagnosis not present

## 2017-05-24 ENCOUNTER — Other Ambulatory Visit: Payer: Self-pay | Admitting: Internal Medicine

## 2017-06-04 DIAGNOSIS — Z23 Encounter for immunization: Secondary | ICD-10-CM | POA: Diagnosis not present

## 2017-06-16 ENCOUNTER — Ambulatory Visit (INDEPENDENT_AMBULATORY_CARE_PROVIDER_SITE_OTHER): Payer: Medicare Other | Admitting: Family Medicine

## 2017-06-16 ENCOUNTER — Encounter: Payer: Self-pay | Admitting: Family Medicine

## 2017-06-16 VITALS — BP 130/68 | HR 78 | Temp 99.2°F | Wt 165.0 lb

## 2017-06-16 DIAGNOSIS — R6889 Other general symptoms and signs: Secondary | ICD-10-CM | POA: Insufficient documentation

## 2017-06-16 NOTE — Patient Instructions (Addendum)
Thank you for coming in,   Please try ibuprofen or Tylenol for the pain. Please continue the Mucinex for the cough and also honey or lozenges.  Please follow-up with me if her symptoms seem to worsen.   Please feel free to call with any questions or concerns at any time, at [phone]. --Dr. Raeford Razor

## 2017-06-16 NOTE — Assessment & Plan Note (Signed)
Symptoms seem to be consistent with a viral illness. Possible flu.  - advised and counseled on supportive care.  - given indications to follow up.

## 2017-06-16 NOTE — Progress Notes (Signed)
Tracy Hammond - 76 y.o. female MRN [id number]  Date of birth: March 03, 1941  SUBJECTIVE:  Including CC & ROS.  Chief Complaint  Patient presents with  . Generalized Body Aches    since sunday---but has worsened since yesterday AM---also coughing, no sputum, slight congestion in head, no sore throat---has had recent problems with allergies    Ms. Tracy Hammond is a 76 yo F that is presenting with flu like symptoms. Her symptoms been ongoing since Sunday. She received the flu vaccine 2 weeks ago. Has had some coughing that is intermittent throughout the day. Has tried some Mucinex.     Review of Systems  Constitutional: Positive for chills. Negative for fever.  Respiratory: Negative for shortness of breath.   Cardiovascular: Negative for chest pain.  Gastrointestinal: Negative for constipation and diarrhea.  Genitourinary: Negative for dysuria.    HISTORY: Past Medical, Surgical, Social, and Family History Reviewed & Updated per EMR.   Pertinent Historical Findings include:  Past Medical History:  Diagnosis Date  . Allergy    seasonal  . Arthritis    fingers  . Bursitis   . Cancer ([place])    skin cancer arm and face  . Diabetes mellitus   . High cholesterol   . Hypertension   . Personal history of colonic adenoma 06/12/2008  . Thyroid disease    young adult-no meds now    Past Surgical History:  Procedure Laterality Date  . Bladder Tact    . CATARACT EXTRACTION  09-07-2012   rt eye  . CATARACT EXTRACTION Right 08/2012  . CHOLECYSTECTOMY    . COLONOSCOPY    . SKIN CANCER EXCISION    . TONSILLECTOMY AND ADENOIDECTOMY    . TUBAL LIGATION      No Known Allergies  Family History  Problem Relation Age of Onset  . Hypertension Father        family hx  . Diabetes Mother   . Kidney failure Mother   . Heart disease Mother   . Stroke Sister   . Other Sister        Leg Disease  . Heart disease Son   . Colon cancer Neg Hx   . Esophageal cancer Neg Hx   . Rectal cancer Neg Hx   .  Stomach cancer Neg Hx      Social History   Social History  . Marital status: Married    Spouse name: N/A  . Number of children: 3  . Years of education: N/A   Occupational History  . housewife    Social History Main Topics  . Smoking status: Current Every Day Smoker    Packs/day: 0.50    Years: 60.00    Types: Cigarettes  . Smokeless tobacco: Never Used  . Alcohol use No  . Drug use: No  . Sexual activity: Not on file   Other Topics Concern  . Not on file   Social History Narrative   The patient is married. 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 and 2 daughters   3 caffeinated beverages daily   06/19/2013           PHYSICAL EXAM:  VS: BP 130/68   Pulse 78   Temp 99.2 F (37.3 C)   Wt 165 lb (74.8 kg)   SpO2 97%   BMI 31.69 kg/m  Physical Exam  Gen: NAD, alert, cooperative with exam,  ENT: normal lips, normal nasal mucosa, tympanic membranes clear and intact bilaterally, no cervical adenopathy, no tonsillar exudates  Eye: normal EOM, normal conjunctiva and lids CV:  no edema, +2 pedal pulses, S1-S2, regular rate and rhythm   Resp: no accessory muscle use, non-labored, clear to auscultation bilaterally, no crackles GI: no masses or tenderness, no hernia  Skin: no rashes, no areas of induration  Neuro: normal tone, normal sensation to touch Psych:  normal insight, alert and oriented MSK: Normal strength, normal gait      ASSESSMENT & PLAN:   Flu-like symptoms Symptoms seem to be consistent with a viral illness. Possible flu.  - advised and counseled on supportive care.  - given indications to follow up.

## 2017-06-18 ENCOUNTER — Telehealth: Payer: Self-pay | Admitting: Family Medicine

## 2017-06-18 NOTE — Telephone Encounter (Signed)
Pt notified and voiced understanding 

## 2017-06-18 NOTE — Telephone Encounter (Signed)
Patient states she still doesn't feel well. She is coughing up "green" stuff. She wanted to know if something else could be done. Please advise.

## 2017-06-21 ENCOUNTER — Encounter: Payer: Self-pay | Admitting: Internal Medicine

## 2017-06-21 ENCOUNTER — Ambulatory Visit (INDEPENDENT_AMBULATORY_CARE_PROVIDER_SITE_OTHER): Payer: Medicare Other | Admitting: Internal Medicine

## 2017-06-21 DIAGNOSIS — F17200 Nicotine dependence, unspecified, uncomplicated: Secondary | ICD-10-CM

## 2017-06-21 DIAGNOSIS — R05 Cough: Secondary | ICD-10-CM | POA: Diagnosis not present

## 2017-06-21 DIAGNOSIS — R059 Cough, unspecified: Secondary | ICD-10-CM

## 2017-06-21 MED ORDER — DOXYCYCLINE HYCLATE 100 MG PO TABS: 100.0000 mg | ORAL_TABLET | Freq: Two times a day (BID) | ORAL | 0 refills | Status: DC

## 2017-06-21 MED ORDER — HYDROCODONE-HOMATROPINE 5-1.5 MG/5ML PO SYRP: 5.0000 mL | ORAL_SOLUTION | ORAL | 0 refills | Status: DC | PRN

## 2017-06-21 MED ORDER — PREDNISONE 20 MG PO TABS: 40.0000 mg | ORAL_TABLET | Freq: Every day | ORAL | 0 refills | Status: DC

## 2017-06-21 NOTE — Patient Instructions (Signed)
We have given you a medicine for the cough.   We have sent in prednisone to take 2 pills daily for 5 days to help the wheezing.   The antibiotic is called doxycycline to take 1 pill twice a day for 1 week.   Call us if getting worse or if not better.

## 2017-06-21 NOTE — Assessment & Plan Note (Signed)
She is advised to quit to help her health although she does not feel able to quit. She is aware of the harms of cigarettes on her health.

## 2017-06-21 NOTE — Progress Notes (Signed)
   Subjective:    Patient ID: Tracy Hammond, female    DOB: [birth year], 76 y.o.   MRN: [id number]  HPI The patient is a 76 YO female coming in for worsening cough and SOB. She was seen last week and advised to use otc mucinex and ibuprofen and tylenol. She does have SOB with activity and is mostly resting. Started about 8 days ago and overall worsening. Tried the mucinex and tylenol which were mildly helpful. Some clear sputum. No allergy symptoms or drainage. Is a smoker and is still smoking through this although not as much. She has not used her albuterol although she now thinks maybe she should have. Having some chills and 99 temp on tylenol.   Review of Systems  Constitutional: Positive for activity change, chills and fatigue. Negative for appetite change, fever and unexpected weight change.  HENT: Negative for congestion, ear discharge, ear pain, postnasal drip, sinus pain, sinus pressure, sore throat and trouble swallowing.   Eyes: Negative.   Respiratory: Positive for cough and shortness of breath. Negative for chest tightness and wheezing.   Cardiovascular: Negative.   Gastrointestinal: Negative.   Musculoskeletal: Negative.   Skin: Negative.       Objective:   Physical Exam  Constitutional: She is oriented to person, place, and time. She appears well-developed and well-nourished.  HENT:  Head: Normocephalic and atraumatic.  Nose: Nose normal.  Mouth/Throat: Oropharynx is clear and moist.  Eyes: EOM are normal.  Neck: Normal range of motion.  Cardiovascular: Normal rate and regular rhythm.   Pulmonary/Chest: Effort normal. No respiratory distress. She has wheezes. She has no rales.  Mild expiratory wheezing which does not clear with coughing  Abdominal: Soft.  Neurological: She is alert and oriented to person, place, and time. Coordination normal.   Vitals:   06/21/17 1024 06/21/17 1050  BP: (!) 190/80 (!) 160/78  Pulse: 80   Temp: 98.5 F (36.9 C)   TempSrc: Oral     SpO2: 98%   Weight: 167 lb (75.8 kg)   Height: 5' 0.5" (1.537 m)       Assessment & Plan:

## 2017-06-21 NOTE — Assessment & Plan Note (Signed)
Rx for hycodan, doxycycline, prednisone. She is in flare. Likely has COPD and once clear if still having cough needs spirometry. Advised to stop smoking but she does not feel able to make an attempt to quit right now.

## 2017-07-01 ENCOUNTER — Encounter: Payer: Self-pay | Admitting: Internal Medicine

## 2017-07-01 DIAGNOSIS — R197 Diarrhea, unspecified: Secondary | ICD-10-CM | POA: Diagnosis not present

## 2017-07-01 DIAGNOSIS — R5383 Other fatigue: Secondary | ICD-10-CM | POA: Diagnosis not present

## 2017-07-01 DIAGNOSIS — Z124 Encounter for screening for malignant neoplasm of cervix: Secondary | ICD-10-CM | POA: Diagnosis not present

## 2017-07-01 DIAGNOSIS — Z1231 Encounter for screening mammogram for malignant neoplasm of breast: Secondary | ICD-10-CM | POA: Diagnosis not present

## 2017-07-01 DIAGNOSIS — Z01419 Encounter for gynecological examination (general) (routine) without abnormal findings: Secondary | ICD-10-CM | POA: Diagnosis not present

## 2017-07-01 LAB — HM PAP SMEAR

## 2017-07-01 LAB — HM MAMMOGRAPHY

## 2017-07-06 DIAGNOSIS — H353211 Exudative age-related macular degeneration, right eye, with active choroidal neovascularization: Secondary | ICD-10-CM | POA: Diagnosis not present

## 2017-07-06 DIAGNOSIS — H353223 Exudative age-related macular degeneration, left eye, with inactive scar: Secondary | ICD-10-CM | POA: Diagnosis not present

## 2017-07-06 DIAGNOSIS — H43813 Vitreous degeneration, bilateral: Secondary | ICD-10-CM | POA: Diagnosis not present

## 2017-07-06 DIAGNOSIS — H33191 Other retinoschisis and retinal cysts, right eye: Secondary | ICD-10-CM | POA: Diagnosis not present

## 2017-08-06 ENCOUNTER — Other Ambulatory Visit: Payer: Self-pay | Admitting: Internal Medicine

## 2017-08-30 ENCOUNTER — Telehealth: Payer: Self-pay | Admitting: Internal Medicine

## 2017-08-30 MED ORDER — FOSINOPRIL SODIUM 40 MG PO TABS: 40.0000 mg | ORAL_TABLET | Freq: Every day | ORAL | 0 refills | Status: DC

## 2017-08-30 NOTE — Telephone Encounter (Signed)
Annual appt due in Jan must see provider for future refills...Tracy Hammond

## 2017-08-30 NOTE — Telephone Encounter (Signed)
Pt is requesting refills on fosinopril 40 mg tablet.

## 2017-09-06 DIAGNOSIS — H401123 Primary open-angle glaucoma, left eye, severe stage: Secondary | ICD-10-CM | POA: Diagnosis not present

## 2017-09-06 DIAGNOSIS — H401114 Primary open-angle glaucoma, right eye, indeterminate stage: Secondary | ICD-10-CM | POA: Diagnosis not present

## 2017-09-08 ENCOUNTER — Ambulatory Visit (INDEPENDENT_AMBULATORY_CARE_PROVIDER_SITE_OTHER): Payer: Medicare Other | Admitting: Internal Medicine

## 2017-09-08 ENCOUNTER — Encounter: Payer: Self-pay | Admitting: Internal Medicine

## 2017-09-08 ENCOUNTER — Other Ambulatory Visit (INDEPENDENT_AMBULATORY_CARE_PROVIDER_SITE_OTHER): Payer: Medicare Other

## 2017-09-08 VITALS — BP 136/80 | HR 64 | Temp 97.7°F | Ht 60.5 in | Wt 163.0 lb

## 2017-09-08 DIAGNOSIS — E119 Type 2 diabetes mellitus without complications: Secondary | ICD-10-CM

## 2017-09-08 DIAGNOSIS — Z0000 Encounter for general adult medical examination without abnormal findings: Secondary | ICD-10-CM | POA: Diagnosis not present

## 2017-09-08 DIAGNOSIS — E785 Hyperlipidemia, unspecified: Secondary | ICD-10-CM | POA: Diagnosis not present

## 2017-09-08 DIAGNOSIS — F17200 Nicotine dependence, unspecified, uncomplicated: Secondary | ICD-10-CM | POA: Diagnosis not present

## 2017-09-08 DIAGNOSIS — E1169 Type 2 diabetes mellitus with other specified complication: Secondary | ICD-10-CM | POA: Diagnosis not present

## 2017-09-08 DIAGNOSIS — G2581 Restless legs syndrome: Secondary | ICD-10-CM

## 2017-09-08 DIAGNOSIS — E2839 Other primary ovarian failure: Secondary | ICD-10-CM

## 2017-09-08 DIAGNOSIS — I10 Essential (primary) hypertension: Secondary | ICD-10-CM | POA: Diagnosis not present

## 2017-09-08 LAB — COMPREHENSIVE METABOLIC PANEL
ALBUMIN: 4.2 g/dL (ref 3.5–5.2)
ALT: 22 U/L (ref 0–35)
AST: 28 U/L (ref 0–37)
Alkaline Phosphatase: 88 U/L (ref 39–117)
BUN: 12 mg/dL (ref 6–23)
CALCIUM: 9.4 mg/dL (ref 8.4–10.5)
CHLORIDE: 100 meq/L (ref 96–112)
CO2: 28 meq/L (ref 19–32)
CREATININE: 0.51 mg/dL (ref 0.40–1.20)
GFR: 125.17 mL/min (ref >60.00)
Glucose, Bld: 192 mg/dL — ABNORMAL HIGH (ref 70–99)
POTASSIUM: 4.3 meq/L (ref 3.5–5.1)
Sodium: 138 mEq/L (ref 135–145)
Total Bilirubin: 0.7 mg/dL (ref 0.2–1.2)
Total Protein: 6.6 g/dL (ref 6.0–8.3)

## 2017-09-08 LAB — CBC
HEMATOCRIT: 43.4 % (ref 36.0–46.0)
HEMOGLOBIN: 14.5 g/dL (ref 12.0–15.0)
MCHC: 33.4 g/dL (ref 30.0–36.0)
MCV: 97.8 fl (ref 78.0–100.0)
PLATELETS: 214 10*3/uL (ref 150.0–400.0)
RBC: 4.43 Mil/uL (ref 3.87–5.11)
RDW: 13.8 % (ref 11.5–15.5)
WBC: 8.1 10*3/uL (ref 4.0–10.5)

## 2017-09-08 LAB — LIPID PANEL
CHOL/HDL RATIO: 3
CHOLESTEROL: 147 mg/dL (ref 0–200)
HDL: 42.7 mg/dL (ref >39.00)
NonHDL: 103.9
Triglycerides: 216 mg/dL — ABNORMAL HIGH (ref 0.0–149.0)
VLDL: 43.2 mg/dL — ABNORMAL HIGH (ref 0.0–40.0)

## 2017-09-08 LAB — HEMOGLOBIN A1C: Hgb A1c MFr Bld: 9 % — ABNORMAL HIGH (ref 4.6–6.5)

## 2017-09-08 LAB — LDL CHOLESTEROL, DIRECT: Direct LDL: 84 mg/dL

## 2017-09-08 MED ORDER — TIZANIDINE HCL 2 MG PO TABS: 2.0000 mg | ORAL_TABLET | Freq: Every day | ORAL | 3 refills | Status: DC

## 2017-09-08 NOTE — Assessment & Plan Note (Signed)
Checking HgA1c, taking metformin daily. If HgA1c still >8 will increase to BID metformin. Foot exam done and performs eye exam. Not complicated.

## 2017-09-08 NOTE — Addendum Note (Signed)
Addended by: Pricilla Holm A on: 09/08/2017 08:55 AM   Modules accepted: Orders

## 2017-09-08 NOTE — Assessment & Plan Note (Signed)
Takes requip at night time and refill as needed.

## 2017-09-08 NOTE — Assessment & Plan Note (Signed)
BP at goal on fosinopril. Checking CMP and adjust as needed.

## 2017-09-08 NOTE — Assessment & Plan Note (Signed)
Flu and pneumonia and tetanus up to date. Ordered bone density. She is up to date on colonoscopy and eye exam. Counseled about shingrix. Given 10 year screening recommendations. Counseled about sun safety and mole surveillance.

## 2017-09-08 NOTE — Progress Notes (Signed)
   Subjective:    Patient ID: Tracy Hammond, female    DOB: [birth year], 77 y.o.   MRN: [id number]  HPI Here for medicare wellness and physical, no new complaints. Please see A/P for status and treatment of chronic medical problems.   Diet: DM since diabetic Physical activity: sedentary Depression/mood screen: negative Hearing: intact to whispered voice Visual acuity: legally blind due to glaucoma, sees eye specialist, performs annual eye exam  ADLs: capable Fall risk: none Home safety: good Cognitive evaluation: intact to orientation, naming, recall and repetition EOL planning: adv directives discussed  I have personally reviewed and have noted 1. The patient's medical and social history - reviewed today no changes 2. Their use of alcohol, tobacco or illicit drugs 3. Their current medications and supplements 4. The patient's functional ability including ADL's, fall risks, home safety risks and hearing or visual impairment. 5. Diet and physical activities 6. Evidence for depression or mood disorders 7. Care team reviewed and updated (available in snapshot)  Review of Systems  Constitutional: Negative.   HENT: Negative.   Eyes: Positive for visual disturbance.  Respiratory: Negative for cough, chest tightness and shortness of breath.   Cardiovascular: Negative for chest pain, palpitations and leg swelling.  Gastrointestinal: Negative for abdominal distention, abdominal pain, constipation, diarrhea, nausea and vomiting.  Musculoskeletal: Negative.   Skin: Negative.   Neurological: Negative.   Psychiatric/Behavioral: Negative.       Objective:   Physical Exam  Constitutional: She is oriented to person, place, and time. She appears well-developed and well-nourished.  HENT:  Head: Normocephalic and atraumatic.  Eyes: EOM are normal.  Neck: Normal range of motion.  Cardiovascular: Normal rate and regular rhythm.  Pulmonary/Chest: Effort normal and breath sounds normal. No  respiratory distress. She has no wheezes. She has no rales.  Abdominal: Soft. Bowel sounds are normal. She exhibits no distension. There is no tenderness. There is no rebound.  Musculoskeletal: She exhibits no edema.  Neurological: She is alert and oriented to person, place, and time. Coordination normal.  Skin: Skin is warm and dry.  See foot exam  Psychiatric: She has a normal mood and affect.   Vitals:   09/08/17 0756 09/08/17 0832  BP: (!) 150/80 136/80  Pulse: 64   Temp: 97.7 F (36.5 C)   TempSrc: Oral   SpO2: 98%   Weight: 163 lb (73.9 kg)   Height: 5' 0.5" (1.537 m)       Assessment & Plan:

## 2017-09-08 NOTE — Assessment & Plan Note (Signed)
Time spent counseling about tobacco usage: 3 minutes. I have asked about smoking and is smoking same than usual. The patient is advised to quit. The patient is not willing to quit. They would like to try to quit in the next 6 months. We will follow up with them in 6 months.

## 2017-09-08 NOTE — Assessment & Plan Note (Signed)
Checking lipid panel, goal LDL <100. Not on statin currently.

## 2017-09-08 NOTE — Patient Instructions (Signed)
Work on stopping smoking.  Health Maintenance, Female Adopting a healthy lifestyle and getting preventive care can go a long way to promote health and wellness. Talk with your health care provider about what schedule of regular examinations is right for you. This is a good chance for you to check in with your provider about disease prevention and staying healthy. In between checkups, there are plenty of things you can do on your own. Experts have done a lot of research about which lifestyle changes and preventive measures are most likely to keep you healthy. Ask your health care provider for more information. Weight and diet Eat a healthy diet  Be sure to include plenty of vegetables, fruits, low-fat dairy products, and lean protein.  Do not eat a lot of foods high in solid fats, added sugars, or salt.  Get regular exercise. This is one of the most important things you can do for your health. ? Most adults should exercise for at least 150 minutes each week. The exercise should increase your heart rate and make you sweat (moderate-intensity exercise). ? Most adults should also do strengthening exercises at least twice a week. This is in addition to the moderate-intensity exercise.  Maintain a healthy weight  Body mass index (BMI) is a measurement that can be used to identify possible weight problems. It estimates body fat based on height and weight. Your health care provider can help determine your BMI and help you achieve or maintain a healthy weight.  For females 8 years of age and older: ? A BMI below 18.5 is considered underweight. ? A BMI of 18.5 to 24.9 is normal. ? A BMI of 25 to 29.9 is considered overweight. ? A BMI of 30 and above is considered obese.  Watch levels of cholesterol and blood lipids  You should start having your blood tested for lipids and cholesterol at 75 years of age, then have this test every 5 years.  You may need to have your cholesterol levels checked more  often if: ? Your lipid or cholesterol levels are high. ? You are older than 75 years of age. ? You are at high risk for heart disease.  Cancer screening Lung Cancer  Lung cancer screening is recommended for adults 102-75 years old who are at high risk for lung cancer because of a history of smoking.  A yearly low-dose CT scan of the lungs is recommended for people who: ? Currently smoke. ? Have quit within the past 15 years. ? Have at least a 30-pack-year history of smoking. A pack year is smoking an average of one pack of cigarettes a day for 1 year.  Yearly screening should continue until it has been 15 years since you quit.  Yearly screening should stop if you develop a health problem that would prevent you from having lung cancer treatment.  Breast Cancer  Practice breast self-awareness. This means understanding how your breasts normally appear and feel.  It also means doing regular breast self-exams. Let your health care provider know about any changes, no matter how small.  If you are in your 20s or 30s, you should have a clinical breast exam (CBE) by a health care provider every 1-3 years as part of a regular health exam.  If you are 19 or older, have a CBE every year. Also consider having a breast X-ray (mammogram) every year.  If you have a family history of breast cancer, talk to your health care provider about genetic screening.  If  you are at high risk for breast cancer, talk to your health care provider about having an MRI and a mammogram every year.  Breast cancer gene (BRCA) assessment is recommended for women who have family members with BRCA-related cancers. BRCA-related cancers include: ? Breast. ? Ovarian. ? Tubal. ? Peritoneal cancers.  Results of the assessment will determine the need for genetic counseling and BRCA1 and BRCA2 testing.  Cervical Cancer Your health care provider may recommend that you be screened regularly for cancer of the pelvic organs  (ovaries, uterus, and vagina). This screening involves a pelvic examination, including checking for microscopic changes to the surface of your cervix (Pap test). You may be encouraged to have this screening done every 3 years, beginning at age 21.  For women ages 30-65, health care providers may recommend pelvic exams and Pap testing every 3 years, or they may recommend the Pap and pelvic exam, combined with testing for human papilloma virus (HPV), every 5 years. Some types of HPV increase your risk of cervical cancer. Testing for HPV may also be done on women of any age with unclear Pap test results.  Other health care providers may not recommend any screening for nonpregnant women who are considered low risk for pelvic cancer and who do not have symptoms. Ask your health care provider if a screening pelvic exam is right for you.  If you have had past treatment for cervical cancer or a condition that could lead to cancer, you need Pap tests and screening for cancer for at least 20 years after your treatment. If Pap tests have been discontinued, your risk factors (such as having a new sexual partner) need to be reassessed to determine if screening should resume. Some women have medical problems that increase the chance of getting cervical cancer. In these cases, your health care provider may recommend more frequent screening and Pap tests.  Colorectal Cancer  This type of cancer can be detected and often prevented.  Routine colorectal cancer screening usually begins at 75 years of age and continues through 75 years of age.  Your health care provider may recommend screening at an earlier age if you have risk factors for colon cancer.  Your health care provider may also recommend using home test kits to check for hidden blood in the stool.  A small camera at the end of a tube can be used to examine your colon directly (sigmoidoscopy or colonoscopy). This is done to check for the earliest forms of  colorectal cancer.  Routine screening usually begins at age 50.  Direct examination of the colon should be repeated every 5-10 years through 75 years of age. However, you may need to be screened more often if early forms of precancerous polyps or small growths are found.  Skin Cancer  Check your skin from head to toe regularly.  Tell your health care provider about any new moles or changes in moles, especially if there is a change in a mole's shape or color.  Also tell your health care provider if you have a mole that is larger than the size of a pencil eraser.  Always use sunscreen. Apply sunscreen liberally and repeatedly throughout the day.  Protect yourself by wearing long sleeves, pants, a wide-brimmed hat, and sunglasses whenever you are outside.  Heart disease, diabetes, and high blood pressure  High blood pressure causes heart disease and increases the risk of stroke. High blood pressure is more likely to develop in: ? People who have blood pressure   in the high end of the normal range (130-139/85-89 mm Hg). ? People who are overweight or obese. ? People who are African American.  If you are 28-40 years of age, have your blood pressure checked every 3-5 years. If you are 66 years of age or older, have your blood pressure checked every year. You should have your blood pressure measured twice-once when you are at a hospital or clinic, and once when you are not at a hospital or clinic. Record the average of the two measurements. To check your blood pressure when you are not at a hospital or clinic, you can use: ? An automated blood pressure machine at a pharmacy. ? A home blood pressure monitor.  If you are between 22 years and 24 years old, ask your health care provider if you should take aspirin to prevent strokes.  Have regular diabetes screenings. This involves taking a blood sample to check your fasting blood sugar level. ? If you are at a normal weight and have a low risk  for diabetes, have this test once every three years after 75 years of age. ? If you are overweight and have a high risk for diabetes, consider being tested at a younger age or more often. Preventing infection Hepatitis B  If you have a higher risk for hepatitis B, you should be screened for this virus. You are considered at high risk for hepatitis B if: ? You were born in a country where hepatitis B is common. Ask your health care provider which countries are considered high risk. ? Your parents were born in a high-risk country, and you have not been immunized against hepatitis B (hepatitis B vaccine). ? You have HIV or AIDS. ? You use needles to inject street drugs. ? You live with someone who has hepatitis B. ? You have had sex with someone who has hepatitis B. ? You get hemodialysis treatment. ? You take certain medicines for conditions, including cancer, organ transplantation, and autoimmune conditions.  Hepatitis C  Blood testing is recommended for: ? Everyone born from [birth year] through 1963. ? Anyone with known risk factors for hepatitis C.  Sexually transmitted infections (STIs)  You should be screened for sexually transmitted infections (STIs) including gonorrhea and chlamydia if: ? You are sexually active and are younger than 75 years of age. ? You are older than 75 years of age and your health care provider tells you that you are at risk for this type of infection. ? Your sexual activity has changed since you were last screened and you are at an increased risk for chlamydia or gonorrhea. Ask your health care provider if you are at risk.  If you do not have HIV, but are at risk, it may be recommended that you take a prescription medicine daily to prevent HIV infection. This is called pre-exposure prophylaxis (PrEP). You are considered at risk if: ? You are sexually active and do not regularly use condoms or know the HIV status of your partner(s). ? You take drugs by  injection. ? You are sexually active with a partner who has HIV.  Talk with your health care provider about whether you are at high risk of being infected with HIV. If you choose to begin PrEP, you should first be tested for HIV. You should then be tested every 3 months for as long as you are taking PrEP. Pregnancy  If you are premenopausal and you may become pregnant, ask your health care provider about preconception counseling.  If you may become pregnant, take 400 to 800 micrograms (mcg) of folic acid every day.  If you want to prevent pregnancy, talk to your health care provider about birth control (contraception). Osteoporosis and menopause  Osteoporosis is a disease in which the bones lose minerals and strength with aging. This can result in serious bone fractures. Your risk for osteoporosis can be identified using a bone density scan.  If you are 17 years of age or older, or if you are at risk for osteoporosis and fractures, ask your health care provider if you should be screened.  Ask your health care provider whether you should take a calcium or vitamin D supplement to lower your risk for osteoporosis.  Menopause may have certain physical symptoms and risks.  Hormone replacement therapy may reduce some of these symptoms and risks. Talk to your health care provider about whether hormone replacement therapy is right for you. Follow these instructions at home:  Schedule regular health, dental, and eye exams.  Stay current with your immunizations.  Do not use any tobacco products including cigarettes, chewing tobacco, or electronic cigarettes.  If you are pregnant, do not drink alcohol.  If you are breastfeeding, limit how much and how often you drink alcohol.  Limit alcohol intake to no more than 1 drink per day for nonpregnant women. One drink equals 12 ounces of beer, 5 ounces of wine, or 1 ounces of hard liquor.  Do not use street drugs.  Do not share needles.  Ask  your health care provider for help if you need support or information about quitting drugs.  Tell your health care provider if you often feel depressed.  Tell your health care provider if you have ever been abused or do not feel safe at home. This information is not intended to replace advice given to you by your health care provider. Make sure you discuss any questions you have with your health care provider. Document Released: 03/02/2011 Document Revised: 01/23/2016 Document Reviewed: 05/21/2015 Elsevier Interactive Patient Education  [employer].

## 2017-09-14 ENCOUNTER — Telehealth: Payer: Self-pay | Admitting: Internal Medicine

## 2017-09-14 NOTE — Telephone Encounter (Signed)
Copied from [id number] [id number] [id number]. Topic: General - Call Back - No Documentation >> Sep 14, 2017  8:21 AM Darl Householder, RMA wrote: Reason for CRM: patient is returning call from [facility] concerning medication

## 2017-09-14 NOTE — Telephone Encounter (Signed)
Contacted patient, taken care of through lab notes

## 2017-09-17 ENCOUNTER — Other Ambulatory Visit: Payer: Self-pay | Admitting: Internal Medicine

## 2017-09-17 MED ORDER — METFORMIN HCL 500 MG PO TABS: 500.0000 mg | ORAL_TABLET | Freq: Two times a day (BID) | ORAL | 2 refills | Status: DC

## 2017-09-21 DIAGNOSIS — H353211 Exudative age-related macular degeneration, right eye, with active choroidal neovascularization: Secondary | ICD-10-CM | POA: Diagnosis not present

## 2017-09-21 DIAGNOSIS — H35423 Microcystoid degeneration of retina, bilateral: Secondary | ICD-10-CM | POA: Diagnosis not present

## 2017-09-21 DIAGNOSIS — H353223 Exudative age-related macular degeneration, left eye, with inactive scar: Secondary | ICD-10-CM | POA: Diagnosis not present

## 2017-09-21 DIAGNOSIS — H33101 Unspecified retinoschisis, right eye: Secondary | ICD-10-CM | POA: Diagnosis not present

## 2017-09-24 ENCOUNTER — Other Ambulatory Visit: Payer: Self-pay | Admitting: Internal Medicine

## 2017-10-29 ENCOUNTER — Ambulatory Visit: Payer: Medicare Other | Admitting: Internal Medicine

## 2017-10-29 ENCOUNTER — Encounter: Payer: Self-pay | Admitting: Internal Medicine

## 2017-10-29 ENCOUNTER — Ambulatory Visit (INDEPENDENT_AMBULATORY_CARE_PROVIDER_SITE_OTHER)
Admission: RE | Admit: 2017-10-29 | Discharge: 2017-10-29 | Disposition: A | Discharge status: Home / Self Care | Payer: Medicare Other | Source: Ambulatory Visit | Attending: Internal Medicine | Admitting: Internal Medicine

## 2017-10-29 DIAGNOSIS — E2839 Other primary ovarian failure: Secondary | ICD-10-CM | POA: Diagnosis not present

## 2017-10-29 DIAGNOSIS — R21 Rash and other nonspecific skin eruption: Secondary | ICD-10-CM | POA: Insufficient documentation

## 2017-10-29 MED ORDER — TRIAMCINOLONE ACETONIDE 0.1 % EX CREA: 1.0000 "application " | TOPICAL_CREAM | Freq: Two times a day (BID) | CUTANEOUS | 0 refills | Status: DC

## 2017-10-29 NOTE — Patient Instructions (Signed)
We have sent in a cream to use on the spot twice a day for the next week or so.   If it start hurting or swelling more call us back.

## 2017-10-29 NOTE — Assessment & Plan Note (Signed)
Rx for triamcinolone ointment for her leg. Suspect bruising under the skin from trauma unknown.

## 2017-10-29 NOTE — Progress Notes (Signed)
   Subjective:    Patient ID: Tracy Hammond, female    DOB: Nov 13, 1940, 77 y.o.   MRN: [id number]  HPI The patient is a 77 YO female coming in for spot on her left leg. Not painful although tender to the touch. Denies injury or bumping the area. Not spreading since it starting. Started about 2-3 days ago. Does have history of varicose veins. Has not tried anything for it.   Review of Systems  Constitutional: Negative.   HENT: Negative.   Eyes: Negative.   Respiratory: Negative for cough, chest tightness and shortness of breath.   Cardiovascular: Negative for chest pain, palpitations and leg swelling.  Gastrointestinal: Negative for abdominal distention, abdominal pain, constipation, diarrhea, nausea and vomiting.  Musculoskeletal: Negative.   Skin: Positive for rash.  Neurological: Negative.   Psychiatric/Behavioral: Negative.       Objective:   Physical Exam  Constitutional: She is oriented to person, place, and time. She appears well-developed and well-nourished.  HENT:  Head: Normocephalic and atraumatic.  Eyes: EOM are normal.  Neck: Normal range of motion.  Cardiovascular: Normal rate and regular rhythm.  Pulmonary/Chest: Effort normal and breath sounds normal. No respiratory distress. She has no wheezes. She has no rales.  Abdominal: Soft.  Musculoskeletal: She exhibits no edema.  Neurological: She is alert and oriented to person, place, and time. Coordination normal.  Skin: Skin is warm and dry.  Small 1-2 cm circular red/purple rash on the left shin medially, with small firm nodule under the surface, mildly tender to palpation, non-blanching.    Vitals:   10/29/17 1543  BP: 140/70  Pulse: 80  Temp: 98.2 F (36.8 C)  TempSrc: Oral  SpO2: 97%  Weight: 166 lb (75.3 kg)  Height: 5' 0.5" (1.537 m)      Assessment & Plan:

## 2017-11-01 DIAGNOSIS — E2839 Other primary ovarian failure: Secondary | ICD-10-CM | POA: Diagnosis not present

## 2017-11-09 ENCOUNTER — Inpatient Hospital Stay: Admission: RE | Admit: 2017-11-09 | Payer: Medicare Other | Source: Ambulatory Visit

## 2017-11-22 ENCOUNTER — Other Ambulatory Visit: Payer: Self-pay | Admitting: Internal Medicine

## 2017-11-23 DIAGNOSIS — D485 Neoplasm of uncertain behavior of skin: Secondary | ICD-10-CM | POA: Diagnosis not present

## 2017-11-23 DIAGNOSIS — L570 Actinic keratosis: Secondary | ICD-10-CM | POA: Diagnosis not present

## 2017-11-23 DIAGNOSIS — D044 Carcinoma in situ of skin of scalp and neck: Secondary | ICD-10-CM | POA: Diagnosis not present

## 2017-12-07 DIAGNOSIS — H353211 Exudative age-related macular degeneration, right eye, with active choroidal neovascularization: Secondary | ICD-10-CM | POA: Diagnosis not present

## 2017-12-07 DIAGNOSIS — H353223 Exudative age-related macular degeneration, left eye, with inactive scar: Secondary | ICD-10-CM | POA: Diagnosis not present

## 2017-12-07 DIAGNOSIS — H35371 Puckering of macula, right eye: Secondary | ICD-10-CM | POA: Diagnosis not present

## 2017-12-07 DIAGNOSIS — H33191 Other retinoschisis and retinal cysts, right eye: Secondary | ICD-10-CM | POA: Diagnosis not present

## 2017-12-29 ENCOUNTER — Encounter (INDEPENDENT_AMBULATORY_CARE_PROVIDER_SITE_OTHER): Payer: Self-pay | Admitting: Orthopaedic Surgery

## 2017-12-29 ENCOUNTER — Ambulatory Visit (INDEPENDENT_AMBULATORY_CARE_PROVIDER_SITE_OTHER): Payer: Self-pay

## 2017-12-29 ENCOUNTER — Ambulatory Visit (INDEPENDENT_AMBULATORY_CARE_PROVIDER_SITE_OTHER): Payer: Medicare Other | Admitting: Orthopaedic Surgery

## 2017-12-29 DIAGNOSIS — M1611 Unilateral primary osteoarthritis, right hip: Secondary | ICD-10-CM

## 2017-12-29 DIAGNOSIS — M25551 Pain in right hip: Secondary | ICD-10-CM | POA: Diagnosis not present

## 2017-12-29 MED ORDER — TRAMADOL HCL 50 MG PO TABS: 50.0000 mg | ORAL_TABLET | Freq: Four times a day (QID) | ORAL | 0 refills | Status: DC | PRN

## 2017-12-29 NOTE — Progress Notes (Signed)
The patient is someone well-known to me.  She is 77 year old female with known osteoarthritis and degenerative disease of her right hip.  She has had intra-articular steroid injections in the past and is always been about 2 years between these injections.  She has had a flareup of hip pain again and would still like to pursue nonoperative conservative treatment and would like another intra-articular steroid injection her right hip.  There is been no other acute changes in her medical status.  On exam her left hip is normal but her right hip she has significant pain and stiffness with internal or external rotation is all in the groin.  An AP pelvis and lateral right hip shows moderate arthritic change in the right hip with superior lateral joint space narrowing as well as particular osteophytes and sclerotic changes.  She has been to take 600 mg of ibuprofen tonight at dinner and more breakfast and I will send in some tramadol for acute pain.  We will set her up for an intra-articular steroid injection under direct fluoroscopy in her right hip by Dr. Ernestina Patches in the near future.  She will otherwise follow-up as needed.

## 2017-12-31 ENCOUNTER — Other Ambulatory Visit (INDEPENDENT_AMBULATORY_CARE_PROVIDER_SITE_OTHER): Payer: Self-pay

## 2017-12-31 DIAGNOSIS — M25551 Pain in right hip: Secondary | ICD-10-CM

## 2018-01-03 ENCOUNTER — Ambulatory Visit (INDEPENDENT_AMBULATORY_CARE_PROVIDER_SITE_OTHER): Payer: Medicare Other | Admitting: Orthopaedic Surgery

## 2018-01-18 ENCOUNTER — Encounter (INDEPENDENT_AMBULATORY_CARE_PROVIDER_SITE_OTHER): Payer: Self-pay | Admitting: Physical Medicine and Rehabilitation

## 2018-01-18 ENCOUNTER — Ambulatory Visit (INDEPENDENT_AMBULATORY_CARE_PROVIDER_SITE_OTHER): Payer: Medicare Other

## 2018-01-18 ENCOUNTER — Ambulatory Visit (INDEPENDENT_AMBULATORY_CARE_PROVIDER_SITE_OTHER): Payer: Medicare Other | Admitting: Physical Medicine and Rehabilitation

## 2018-01-18 DIAGNOSIS — M25551 Pain in right hip: Secondary | ICD-10-CM

## 2018-01-18 MED ORDER — BUPIVACAINE HCL 0.5 % IJ SOLN
3.0000 mL | INTRAMUSCULAR | Status: AC | PRN
  Administered 2018-01-18: 3 mL via INTRA_ARTICULAR

## 2018-01-18 MED ORDER — TRIAMCINOLONE ACETONIDE 40 MG/ML IJ SUSP
80.0000 mg | INTRAMUSCULAR | Status: AC | PRN
  Administered 2018-01-18: 80 mg via INTRA_ARTICULAR

## 2018-01-18 NOTE — Patient Instructions (Signed)

## 2018-01-18 NOTE — Progress Notes (Signed)
 .  Numeric Pain Rating Scale and Functional Assessment Average Pain 8   In the last MONTH (on 0-10 scale) has pain interfered with the following?  1. General activity like being  able to carry out your everyday physical activities such as walking, climbing stairs, carrying groceries, or moving a chair?  Rating(7)   +Driver, -BT, -Dye Allergies.  

## 2018-01-18 NOTE — Progress Notes (Signed)
Tracy Hammond - 77 y.o. female MRN [id number]  Date of birth: 21-Jul-1941  Office Visit Note: Visit Date: 01/18/2018 PCP: Hoyt Koch, MD Referred by: Hoyt Koch, *  Subjective: Chief Complaint  Patient presents with  . Right Hip - Pain   HPI: Tracy Hammond is a 77 year old female that comes in today for planned diagnostic and therapeutic anesthetic hip arthrogram on the right.  This is requested by Dr. Ninfa Linden.  She is seen in follow-up.  Last time we saw her was in 2017 and completed intra-articular hip injection with good relief up until just the last few months.  She has had no new trauma or other issues.  She continues to suffer from some macular degeneration.     ROS Otherwise per HPI.  Assessment & Plan: Visit Diagnoses:  1. Pain in right hip     Plan: Findings:  Diagnostic and therapeutic right anesthetic hip arthrogram.  Patient did get relief during the anesthetic phase of the injection.    Meds & Orders: No orders of the defined types were placed in this encounter.   Orders Placed This Encounter  Procedures  . Large Joint Inj: R hip joint  . XR C-ARM NO REPORT    Follow-up: Return if symptoms worsen or fail to improve, for Dr. Ninfa Linden.   Procedures: Large Joint Inj: R hip joint on 01/18/2018 1:10 PM Indications: pain and diagnostic evaluation Details: 22 G needle, anterior approach  Arthrogram: Yes  Medications: 80 mg triamcinolone acetonide 40 MG/ML; 3 mL bupivacaine 0.5 % Outcome: tolerated well, no immediate complications  Arthrogram demonstrated excellent flow of contrast throughout the joint surface without extravasation or obvious defect.  The patient had relief of symptoms during the anesthetic phase of the injection.  Procedure, treatment alternatives, risks and benefits explained, specific risks discussed. Consent was given by the patient. Immediately prior to procedure a time out was called to verify the correct patient, procedure,  equipment, support staff and site/side marked as required. Patient was prepped and draped in the usual sterile fashion.      No notes on file   Clinical History: No specialty comments available.   She reports that she has been smoking cigarettes.  She has a 30.00 pack-year smoking history. She has never used smokeless tobacco.  Recent Labs    09/08/17 0901  HGBA1C 9.0*    Objective:  VS:  HT:    WT:   BMI:     BP:   HR: bpm  TEMP: ( )  RESP:  Physical Exam  Ortho Exam Imaging: No results found.  Past Medical/Family/Surgical/Social History: Medications & Allergies reviewed per EMR, new medications updated. Patient Active Problem List   Diagnosis Date Noted  . Unilateral primary osteoarthritis, right hip 12/29/2017  . Rash 10/29/2017  . Cough 11/13/2016  . Routine general medical examination at a health care facility 09/05/2015  . Diarrhea 03/15/2015  . Restless leg syndrome 08/28/2014  . VARICOSE VEINS, LOWER EXTREMITIES 05/14/2008  . Hyperlipidemia associated with type 2 diabetes mellitus ([place]) 04/14/2007  . Diabetes mellitus type 2, uncomplicated ([place]) 88/41/6606  . CIGARETTE SMOKER 03/29/2007  . Essential hypertension 03/29/2007   Past Medical History:  Diagnosis Date  . Allergy    seasonal  . Arthritis    fingers  . Bursitis   . Cancer ([place])    skin cancer arm and face  . Diabetes mellitus   . High cholesterol   . Hypertension   . Personal history of  colonic adenoma 06/12/2008  . Thyroid disease    young adult-no meds now   Family History  Problem Relation Age of Onset  . Hypertension Father        family hx  . Diabetes Mother   . Kidney failure Mother   . Heart disease Mother   . Stroke Sister   . Other Sister        Leg Disease  . Heart disease Son   . Colon cancer Neg Hx   . Esophageal cancer Neg Hx   . Rectal cancer Neg Hx   . Stomach cancer Neg Hx    Past Surgical History:  Procedure Laterality Date  . Bladder Tact    . CATARACT  EXTRACTION  09-07-2012   rt eye  . CATARACT EXTRACTION Right 08/2012  . CHOLECYSTECTOMY    . COLONOSCOPY    . SKIN CANCER EXCISION    . TONSILLECTOMY AND ADENOIDECTOMY    . TUBAL LIGATION     Social History   Occupational History  . Occupation: housewife  Tobacco Use  . Smoking status: Current Every Day Smoker    Packs/day: 0.50    Years: 60.00    Pack years: 30.00    Types: Cigarettes  . Smokeless tobacco: Never Used  Substance and Sexual Activity  . Alcohol use: No  . Drug use: No  . Sexual activity: Not on file

## 2018-02-02 DIAGNOSIS — D044 Carcinoma in situ of skin of scalp and neck: Secondary | ICD-10-CM | POA: Diagnosis not present

## 2018-02-02 DIAGNOSIS — L570 Actinic keratosis: Secondary | ICD-10-CM | POA: Diagnosis not present

## 2018-02-07 DIAGNOSIS — H401123 Primary open-angle glaucoma, left eye, severe stage: Secondary | ICD-10-CM | POA: Diagnosis not present

## 2018-02-07 DIAGNOSIS — H401114 Primary open-angle glaucoma, right eye, indeterminate stage: Secondary | ICD-10-CM | POA: Diagnosis not present

## 2018-03-15 DIAGNOSIS — H33191 Other retinoschisis and retinal cysts, right eye: Secondary | ICD-10-CM | POA: Diagnosis not present

## 2018-03-15 DIAGNOSIS — H353211 Exudative age-related macular degeneration, right eye, with active choroidal neovascularization: Secondary | ICD-10-CM | POA: Diagnosis not present

## 2018-03-15 DIAGNOSIS — H353223 Exudative age-related macular degeneration, left eye, with inactive scar: Secondary | ICD-10-CM | POA: Diagnosis not present

## 2018-03-15 DIAGNOSIS — H35371 Puckering of macula, right eye: Secondary | ICD-10-CM | POA: Diagnosis not present

## 2018-03-16 ENCOUNTER — Ambulatory Visit (INDEPENDENT_AMBULATORY_CARE_PROVIDER_SITE_OTHER): Payer: Medicare Other

## 2018-03-16 ENCOUNTER — Ambulatory Visit (INDEPENDENT_AMBULATORY_CARE_PROVIDER_SITE_OTHER): Payer: Medicare Other | Admitting: Orthopaedic Surgery

## 2018-03-16 ENCOUNTER — Encounter (INDEPENDENT_AMBULATORY_CARE_PROVIDER_SITE_OTHER): Payer: Self-pay | Admitting: Orthopaedic Surgery

## 2018-03-16 DIAGNOSIS — M1611 Unilateral primary osteoarthritis, right hip: Secondary | ICD-10-CM | POA: Diagnosis not present

## 2018-03-16 DIAGNOSIS — M79671 Pain in right foot: Secondary | ICD-10-CM

## 2018-03-16 NOTE — Progress Notes (Signed)
Office Visit Note   Patient: Tracy Hammond           Date of Birth: [birth year]           MRN: [id number] Visit Date: 03/16/2018              Requested by: Hoyt Koch, MD [place], [place] [id number] PCP: Hoyt Koch, MD   Assessment & Plan: Visit Diagnoses:  1. Unilateral primary osteoarthritis, right hip   2. Pain of right heel     Plan:In regards to her rib pain recommended she work on some deep breathing.  Knee right hip as she did have intra-articular injections in every 5 to 6 months as needed for pain.  Recommended changing her shoes daily.  Voltaren gel which she has at home 4 g 4 times daily to the right heel.  She is shown gastrocsoleus stretching exercises.  If pain persist despite these conservative measures in regards to the heel would consider a one-time cortisone injection.  Otherwise she will follow with [place] on an as-needed basis.  Questions encouraged and answered at length.  Follow-Up Instructions: Return if symptoms worsen or fail to improve.   Orders:  Orders Placed This Encounter  Procedures  . XR HIP UNILAT W OR W/O PELVIS 2-3 VIEWS RIGHT  . XR Os Calcis Right   No orders of the defined types were placed in this encounter.     Procedures: No procedures performed   Clinical Data: No additional findings.   Subjective: Chief Complaint  Patient presents with  . Right Hip - Pain  . Right Foot - Pain    HEEL PAIN     HPI Mrs. Tracy Hammond is 77 year old female well-known to Dr. Ninfa Linden service comes in today due to right hip and right heel pain.  She had a fall on Saturday when she lost her balance.  She actually ran into her husband caught her but she has had pain lateral aspect of the hip in her heel.  She is been having some heel pain and feels that this may have caused her to stumble and fall.  Pain started in the heel 1 to 2 weeks ago.  Is worse with ambulation.  Prior to the fall on Saturday she had no acute injury.   She had undergone injection with Dr. Ernestina Patches intra-articular and this is definitely help with her hip pain she has known osteoarthritis of the right hip.  She is also complaining of some right rib pain and has some pain with deep breaths laughing sneezing.  She has had no chest pain or shortness of breath otherwise. Review of Systems Denies any shortness of breath or orthopnea.  Otherwise please see HPI.  Objective: Vital Signs: There were no vitals taken for this visit.  Physical Exam  Constitutional: She is oriented to person, place, and time. She appears well-developed and well-nourished. No distress.  Pulmonary/Chest: Effort normal.  Neurological: She is alert and oriented to person, place, and time.  Skin: She is not diaphoretic.  Psychiatric: She has a normal mood and affect.    Ortho Exam Ribs there is no palpable fracture lateral aspect of the anterior and posterior right ribs.  She does have some bruising over the lateral aspect of the chest wall on the right. Right hip she has limited internal and external rotation which causes pain in the groin area.  Circumflexion of the hip reveals good range of motion without pain. Right lower leg  no rashes skin lesions ulcerations dorsal pedal pulses intact.  She has good sensation throughout the foot.  No tenderness over the Achilles right Achilles is intact.  She has tenderness over the medial tubercle of calcaneus right foot.  Tight gastroc.  Remainder the foot is nontender. Specialty Comments:  No specialty comments available.  Imaging: Xr Hip Unilat W Or W/o Pelvis 2-3 Views Right  Result Date: 03/16/2018 AP pelvis lateral view of the right hip: Bilateral hips well located.  Significant arthritic changes of the right hip.  No acute fracture of either hip or the pelvis.  Xr Os Calcis Right  Result Date: 03/16/2018 Lateral and Harris view right heel: No acute fracture.  Heel spur and a Haglund's deformity is present.  No other bony  abnormalities.    PMFS History: Patient Active Problem List   Diagnosis Date Noted  . Unilateral primary osteoarthritis, right hip 12/29/2017  . Rash 10/29/2017  . Cough 11/13/2016  . Routine general medical examination at a health care facility 09/05/2015  . Diarrhea 03/15/2015  . Restless leg syndrome 08/28/2014  . VARICOSE VEINS, LOWER EXTREMITIES 05/14/2008  . Hyperlipidemia associated with type 2 diabetes mellitus ([place]) 04/14/2007  . Diabetes mellitus type 2, uncomplicated ([place]) 86/76/7209  . CIGARETTE SMOKER 03/29/2007  . Essential hypertension 03/29/2007   Past Medical History:  Diagnosis Date  . Allergy    seasonal  . Arthritis    fingers  . Bursitis   . Cancer ([place])    skin cancer arm and face  . Diabetes mellitus   . High cholesterol   . Hypertension   . Personal history of colonic adenoma 06/12/2008  . Thyroid disease    young adult-no meds now    Family History  Problem Relation Age of Onset  . Hypertension Father        family hx  . Diabetes Mother   . Kidney failure Mother   . Heart disease Mother   . Stroke Sister   . Other Sister        Leg Disease  . Heart disease Son   . Colon cancer Neg Hx   . Esophageal cancer Neg Hx   . Rectal cancer Neg Hx   . Stomach cancer Neg Hx     Past Surgical History:  Procedure Laterality Date  . Bladder Tact    . CATARACT EXTRACTION  09-07-2012   rt eye  . CATARACT EXTRACTION Right 08/2012  . CHOLECYSTECTOMY    . COLONOSCOPY    . SKIN CANCER EXCISION    . TONSILLECTOMY AND ADENOIDECTOMY    . TUBAL LIGATION     Social History   Occupational History  . Occupation: housewife  Tobacco Use  . Smoking status: Current Every Day Smoker    Packs/day: 0.50    Years: 60.00    Pack years: 30.00    Types: Cigarettes  . Smokeless tobacco: Never Used  Substance and Sexual Activity  . Alcohol use: No  . Drug use: No  . Sexual activity: Not on file

## 2018-03-25 ENCOUNTER — Other Ambulatory Visit (INDEPENDENT_AMBULATORY_CARE_PROVIDER_SITE_OTHER): Payer: Self-pay | Admitting: Orthopaedic Surgery

## 2018-03-29 ENCOUNTER — Ambulatory Visit: Payer: Self-pay | Admitting: Internal Medicine

## 2018-03-29 NOTE — Telephone Encounter (Signed)
Patient is calling to report that she fell 2 weeks ago- on her R side. She did go to see her orthopedic and have X-rays which were showed no fracture. It was questionable about a cracked rib. Patient noticed yesterday that she had swelling in her legs and feet. Patient states this is new for her. Appointment made for evaluation.  Reason for Disposition . [1] MODERATE leg swelling (e.g., swelling extends up to knees) AND [2] new onset or worsening  Answer Assessment - Initial Assessment Questions 1. ONSET: "When did the swelling start?" (e.g., minutes, hours, days)     yesterday 2. LOCATION: "What part of the leg is swollen?"  "Are both legs swollen or just one leg?"     Feet and calves, bilateral 3. SEVERITY: "How bad is the swelling?" (e.g., localized; mild, moderate, severe)  - Localized - small area of swelling localized to one leg  - MILD pedal edema - swelling limited to foot and ankle, pitting edema < 1/4 inch (6 mm) deep, rest and elevation eliminate most or all swelling  - MODERATE edema - swelling of lower leg to knee, pitting edema > 1/4 inch (6 mm) deep, rest and elevation only partially reduce swelling  - SEVERE edema - swelling extends above knee, facial or hand swelling present      Ankles/feet are tight- mild 4. REDNESS: "Does the swelling look red or infected?"     no 5. PAIN: "Is the swelling painful to touch?" If so, ask: "How painful is it?"   (Scale 1-10; mild, moderate or severe)     Weakness and slight pain- around feet and ankles, 6-7 6. FEVER: "Do you have a fever?" If so, ask: "What is it, how was it measured, and when did it start?"      no 7. CAUSE: "What do you think is causing the leg swelling?"     Patient fell 2 weeks ago 8. MEDICAL HISTORY: "Do you have a history of heart failure, kidney disease, liver failure, or cancer?"     no 9. RECURRENT SYMPTOM: "Have you had leg swelling before?" If so, ask: "When was the last time?" "What happened that time?"      no 10. OTHER SYMPTOMS: "Do you have any other symptoms?" (e.g., chest pain, difficulty breathing)       Hands are swelling some 11. PREGNANCY: "Is there any chance you are pregnant?" "When was your last menstrual period?"       n/a  Protocols used: LEG SWELLING AND EDEMA-A-AH

## 2018-03-29 NOTE — Telephone Encounter (Signed)
Noted  

## 2018-03-30 ENCOUNTER — Other Ambulatory Visit (INDEPENDENT_AMBULATORY_CARE_PROVIDER_SITE_OTHER): Payer: Medicare Other

## 2018-03-30 ENCOUNTER — Ambulatory Visit: Payer: Medicare Other | Admitting: Internal Medicine

## 2018-03-30 ENCOUNTER — Encounter: Payer: Self-pay | Admitting: Internal Medicine

## 2018-03-30 VITALS — BP 170/62 | HR 68 | Temp 97.7°F | Ht 60.5 in | Wt 169.0 lb

## 2018-03-30 DIAGNOSIS — R0602 Shortness of breath: Secondary | ICD-10-CM

## 2018-03-30 DIAGNOSIS — I872 Venous insufficiency (chronic) (peripheral): Secondary | ICD-10-CM

## 2018-03-30 LAB — COMPREHENSIVE METABOLIC PANEL
ALT: 22 U/L (ref 0–35)
AST: 24 U/L (ref 0–37)
Albumin: 4.3 g/dL (ref 3.5–5.2)
Alkaline Phosphatase: 108 U/L (ref 39–117)
BUN: 12 mg/dL (ref 6–23)
CO2: 31 meq/L (ref 19–32)
CREATININE: 0.51 mg/dL (ref 0.40–1.20)
Calcium: 10 mg/dL (ref 8.4–10.5)
Chloride: 102 mEq/L (ref 96–112)
GFR: 124.98 mL/min (ref >60.00)
GLUCOSE: 163 mg/dL — AB (ref 70–99)
Potassium: 5 mEq/L (ref 3.5–5.1)
SODIUM: 139 meq/L (ref 135–145)
Total Bilirubin: 0.6 mg/dL (ref 0.2–1.2)
Total Protein: 6.9 g/dL (ref 6.0–8.3)

## 2018-03-30 LAB — BRAIN NATRIURETIC PEPTIDE: PRO B NATRI PEPTIDE: 171 pg/mL — AB (ref 0.0–100.0)

## 2018-03-30 NOTE — Progress Notes (Signed)
   Subjective:    Patient ID: Tracy Hammond, female    DOB: 1940/10/31, 76 y.o.   MRN: [id number]  HPI The patient is a 77 YO female coming in for new swelling in her legs. Started yesterday. About 2 weeks ago she had a fall and possible bruised rib. X-rays showed no fracture at her orthopedic office. She has been walking normally since that time. She has been elevating her legs since yesterday when she talked to the nurse. She has been less hydrated and doing some outdoors stuff. Denies change in diet. No pain in her legs except her foot pain which is chronic and improving gradually. Denies rash, fevers or chills. Overall improving since yesterday.   Review of Systems  Constitutional: Negative.   Respiratory: Negative for cough, chest tightness and shortness of breath.   Cardiovascular: Positive for leg swelling. Negative for chest pain and palpitations.  Gastrointestinal: Negative for abdominal distention, abdominal pain, constipation, diarrhea, nausea and vomiting.  Musculoskeletal: Positive for arthralgias and myalgias. Negative for gait problem and joint swelling.  Skin: Negative.   Neurological: Negative.   Psychiatric/Behavioral: Negative.       Objective:   Physical Exam  Constitutional: She is oriented to person, place, and time. She appears well-developed and well-nourished.  HENT:  Head: Normocephalic and atraumatic.  Eyes: EOM are normal.  Neck: Normal range of motion.  Cardiovascular: Normal rate and regular rhythm.  Pulmonary/Chest: Effort normal and breath sounds normal. No respiratory distress. She has no wheezes. She has no rales.  Abdominal: Soft.  Musculoskeletal: She exhibits edema.  1+ edema bilaterally to shin, no calf tenderness, no skin rash.   Neurological: She is alert and oriented to person, place, and time. Coordination normal.  Skin: Skin is warm and dry.   Vitals:   03/30/18 1046  BP: (!) 170/62  Pulse: 68  Temp: 97.7 F (36.5 C)  TempSrc: Oral    SpO2: 96%  Weight: 169 lb (76.7 kg)  Height: 5' 0.5" (1.537 m)      Assessment & Plan:

## 2018-03-30 NOTE — Patient Instructions (Signed)
We will check the labs today. 

## 2018-03-30 NOTE — Assessment & Plan Note (Signed)
Checking CMP and BNP to rule out kidney, liver malfunction and check for fluid retention with heart. Advised to use compression stockings she has at home, elevated legs, increase fluid intake.

## 2018-03-31 ENCOUNTER — Other Ambulatory Visit: Payer: Self-pay | Admitting: Family

## 2018-03-31 DIAGNOSIS — R7989 Other specified abnormal findings of blood chemistry: Secondary | ICD-10-CM

## 2018-04-04 ENCOUNTER — Encounter: Payer: Self-pay | Admitting: Cardiology

## 2018-04-04 ENCOUNTER — Ambulatory Visit: Payer: Medicare Other | Admitting: Cardiology

## 2018-04-04 VITALS — BP 148/78 | HR 69 | Ht 61.5 in | Wt 168.0 lb

## 2018-04-04 DIAGNOSIS — R062 Wheezing: Secondary | ICD-10-CM

## 2018-04-04 DIAGNOSIS — R600 Localized edema: Secondary | ICD-10-CM | POA: Diagnosis not present

## 2018-04-04 DIAGNOSIS — Z716 Tobacco abuse counseling: Secondary | ICD-10-CM | POA: Diagnosis not present

## 2018-04-04 DIAGNOSIS — I10 Essential (primary) hypertension: Secondary | ICD-10-CM

## 2018-04-04 NOTE — Progress Notes (Signed)
Cardiology Office Note:    Date:  04/04/2018   ID:  Tracy Hammond, DOB [birth year], MRN [id number]  PCP:  Hoyt Koch, MD  Cardiologist:  Buford Dresser, MD PhD  Referring MD: Marrian Salvage,*   Chief complaint: lower extremity edema  History of Present Illness:    Tracy Hammond is a 77 y.o. female with a hx hypertension, varicose veins, type II diabetes, hyperlipidemia, tobacco use of who is seen as a new consult at the request of Dr. Sharlet Salina for evaluation of leg swelling and elevated BNP of 171.   She hadn't noticed herself, but family members noticed swelling in her feet on 7/29. Never noticed before, saw Dr. Sharlet Salina on 7/31 to evaluate. CMP largely normal, but BNP elevated to 171. Referred to cardiology for further evaluation.   Has never been told that she had heart problems. Now she has noticed that her shoes are somewhat tight. She also notes swelling in her hands as her rings are tight. No recent changes in medications or diet. Has been working in the garden a lot. No significant SOB on exertion. Sleeps in a recliner due to hip pain. No PND or orthopnea, can lie flat if needed. Notes that the scale today is 8 lbs more than her previous weight at home (but it is within one pound of her recent office weights). Has been drinking more water. No chest pain or tightness, no syncope, no palpitations. Able to do yardwork without issue, can climb a flight of stairs at her daughter's house but otherwise has no steps.  BP 170s/70 at home when her daughter checks it, was after taking her medication. Doesn't take her BP routinely. PMH also notable for varicose vein/spider vein surgery last year.  ROS: Reports that she fell in July after missing a step, cracked a rib and bruised her hip. She attributes this to her poor vision.  SH: current smoker, 4-5 cigarettes/day or sometimes more. Been smoking since age 5. No alcohol.  FH: mother had a stroke, sister had CHF,  another sister is on heart medicine and had a stroke. Son was killed after a motorcycle accident (had a PE after) but was found to have CAD on autopsy.  Past Medical History:  Diagnosis Date  . Allergy    seasonal  . Arthritis    fingers  . Bursitis   . Cancer ([place])    skin cancer arm and face  . Diabetes mellitus   . High cholesterol   . Hypertension   . Personal history of colonic adenoma 06/12/2008  . Thyroid disease    young adult-no meds now    Past Surgical History:  Procedure Laterality Date  . Bladder Tact    . CATARACT EXTRACTION  09-07-2012   rt eye  . CATARACT EXTRACTION Right 08/2012  . CHOLECYSTECTOMY    . COLONOSCOPY    . SKIN CANCER EXCISION    . TONSILLECTOMY AND ADENOIDECTOMY    . TUBAL LIGATION      Current Medications: Current Outpatient Medications on File Prior to Visit  Medication Sig  . aspirin 81 MG tablet Take 1 tablet (81 mg total) by mouth every morning. STOP TODAY 11/25, RESTART 08/08/2013  . brimonidine (ALPHAGAN) 0.15 % ophthalmic solution   . Calcium Carbonate-Vit D-Min (CALCIUM 1200) 1200-1000 MG-UNIT CHEW Chew 1 tablet by mouth daily as needed. For heartburn  . diclofenac sodium (VOLTAREN) 1 % GEL APPLY A THIN LAYER TO AFFECTED AREA UP TO 4 TIMES A DAY  AS NEEDED  . dorzolamide (TRUSOPT) 2 % ophthalmic solution   . fosinopril (MONOPRIL) 40 MG tablet TAKE 1 TABLET BY MOUTH ONCE DAILY ANNUAL  APPT  DUE  IN  JANUARY  -  MUST  SEE  PROVIDER  FOR  FUTURE  REFILLS  . lansoprazole (PREVACID) 30 MG capsule Take 30 mg by mouth every morning.  . metFORMIN (GLUCOPHAGE) 500 MG tablet Take 1 tablet (500 mg total) by mouth 2 (two) times daily with a meal.  . Multiple Vitamin (MULTIVITAMIN WITH MINERALS) TABS Take 1 tablet by mouth every morning.  . timolol (TIMOPTIC) 0.5 % ophthalmic solution   . tiZANidine (ZANAFLEX) 2 MG tablet Take 1 tablet (2 mg total) by mouth at bedtime.  Marland Kitchen tobramycin (TOBREX) 0.3 % ophthalmic solution Place 1 drop into the right eye  every 4 (four) hours as needed.   . traMADol (ULTRAM) 50 MG tablet Take 1-2 tablets (50-100 mg total) by mouth every 6 (six) hours as needed.  Marland Kitchen albuterol (PROVENTIL HFA;VENTOLIN HFA) 108 (90 Base) MCG/ACT inhaler Inhale 2 puffs into the lungs every 6 (six) hours as needed for wheezing or shortness of breath. (Patient not taking: Reported on 04/04/2018)  . MAGNESIUM PO Take 500 mg by mouth.  Marland Kitchen rOPINIRole (REQUIP) 2 MG tablet TAKE 1 TABLET BY MOUTH ONCE DAILY  . triamcinolone cream (KENALOG) 0.1 % Apply 1 application topically 2 (two) times daily.   No current facility-administered medications on file prior to visit.      Allergies:   Patient has no known allergies.   Social History   Socioeconomic History  . Marital status: Married    Spouse name: Not on file  . Number of children: 3  . Years of education: Not on file  . Highest education level: Not on file  Occupational History  . Occupation: housewife  Social Needs  . Financial resource strain: Not on file  . Food insecurity:    Worry: Not on file    Inability: Not on file  . Transportation needs:    Medical: Not on file    Non-medical: Not on file  Tobacco Use  . Smoking status: Current Every Day Smoker    Packs/day: 0.50    Years: 60.00    Pack years: 30.00    Types: Cigarettes  . Smokeless tobacco: Never Used  Substance and Sexual Activity  . Alcohol use: No  . Drug use: No  . Sexual activity: Not on file  Lifestyle  . Physical activity:    Days per week: Not on file    Minutes per session: Not on file  . Stress: Not on file  Relationships  . Social connections:    Talks on phone: Not on file    Gets together: Not on file    Attends religious service: Not on file    Active member of club or organization: Not on file    Attends meetings of clubs or organizations: Not on file    Relationship status: Not on file  Other Topics Concern  . Not on file  Social History Narrative   The patient is married. She is a  housewife. One son and 2 daughters   3 caffeinated beverages daily   06/19/2013           Family History: The patient's family history includes Diabetes in her mother; Heart disease in her mother and son; Hypertension in her father; Kidney failure in her mother; Other in her sister; Stroke in her sister.  There is no history of Colon cancer, Esophageal cancer, Rectal cancer, or Stomach cancer.  ROS:   Please see the history of present illness.  Additional pertinent ROS: Review of Systems  Constitutional: Negative for chills, fever and weight loss.  HENT: Positive for hearing loss. Negative for ear pain.   Eyes: Positive for blurred vision. Negative for pain.       Chronic macular degeneration  Respiratory: Negative for cough, shortness of breath and wheezing.   Cardiovascular: Positive for leg swelling. Negative for chest pain, palpitations, orthopnea, claudication and PND.  Gastrointestinal: Positive for diarrhea. Negative for abdominal pain, blood in stool, melena and nausea.  Genitourinary: Negative for dysuria and hematuria.  Musculoskeletal: Positive for falls, joint pain and myalgias.  Skin: Negative for itching and rash.  Neurological: Negative for focal weakness, loss of consciousness and headaches.  Endo/Heme/Allergies: Bruises/bleeds easily.   EKGs/Labs/Other Studies Reviewed:    The following studies were reviewed today: recent lab results  EKG:  EKG is ordered today.  The ekg ordered today demonstrates normal sinus rhythm  Recent Labs: 09/08/2017: Hemoglobin 14.5; Platelets 214.0 03/30/2018: ALT 22; BUN 12; Creatinine, Ser 0.51; Potassium 5.0; Pro B Natriuretic peptide (BNP) 171.0; Sodium 139  Recent Lipid Panel    Component Value Date/Time   CHOL 147 09/08/2017 0901   TRIG 216.0 (H) 09/08/2017 0901   HDL 42.70 09/08/2017 0901   CHOLHDL 3 09/08/2017 0901   VLDL 43.2 (H) 09/08/2017 0901   LDLCALC 60 09/07/2016 0919   LDLDIRECT 84.0 09/08/2017 0901    Physical  Exam:    VS:  BP (!) 148/78   Pulse 69   Ht 5' 1.5" (1.562 m)   Wt 168 lb (76.2 kg)   BMI 31.23 kg/m     Wt Readings from Last 3 Encounters:  04/04/18 168 lb (76.2 kg)  03/30/18 169 lb (76.7 kg)  10/29/17 166 lb (75.3 kg)     GEN: Well nourished, well developed in no acute distress HEENT: Normal NECK: No JVD; No carotid bruits LYMPHATICS: No lymphadenopathy CARDIAC: regular rhythm, normal S1 and S2, no murmurs, rubs, gallops. Radial and DP pulses 2+ bilaterally. RESPIRATORY:  Clear to auscultation but mild inspiratory and expiratory wheezing, no crackles. ABDOMEN: Soft, non-tender, non-distended MUSCULOSKELETAL:  Trace ankle edema bilaterally; No deformity  SKIN: Warm and dry NEUROLOGIC:  Alert and oriented x 3 PSYCHIATRIC:  Normal affect   ASSESSMENT:    1. Lower extremity edema   2. Wheezing   3. Essential hypertension   4. Tobacco abuse counseling    PLAN:    1. Lower extremity edema: -trace ankle edema bilaterally. Feet are warm and well perfused. No crackles in lungs or JVD. May be related to venous insufficiency, salt-based fluid retention, or diastolic dysfunction -ordered echocardiogram -counseled on foot elevation, salt avoidance. Discussed compression stockings.  2. Mild wheezing, tobacco use: -she does not think that she has ever had PFTs done. Has mild wheezing today, which she attributes to not taking her allergy pill yet. Denies any shortness of breath. Defer to PCP, but if she continues to wheeze may want to investigate with PFTs. -The patient was counseled on the dangers of tobacco use, and was advised to quit.  Reviewed strategies to maximize success, including support of family/friends.  3. Hypertension: mildly elevated in the office today, has had high office readings in the past and high readings at home -counseled to make a log of BP readings, taken 1-2 hours after she takes her medications, not after  recently smoking, after sitting in a chair  quietly for 5-10 minutes -she will bring her cuff to her next PCP visit to calibrate -if she has continued hypertension, and especially if she has diastolic dysfunction on her echo, will need to be more aggressive with medications.  -she is on fosinopril 40 mg as her only BP medication. Given her ankle edema, can consider adding HCTZ or chlorthalidone. Amlodipine also an option, but this may worsen her ankle edema. -she wishes to follow up with her PCP for additional medication titration, but we would be happy to assist if needed.  Plan for follow up: PRN based on results of echo.  Medication Adjustments/Labs and Tests Ordered: Current medicines are reviewed at length with the patient today.  Concerns regarding medicines are outlined above.  Orders Placed This Encounter  Procedures  . EKG 12-Lead  . ECHOCARDIOGRAM COMPLETE   No orders of the defined types were placed in this encounter.   Patient Instructions  Medication Instructions: Your physician recommends that you continue on your current medications as directed.    If you need a refill on your cardiac medications before your next appointment, please call your pharmacy.   Labwork: None  Procedures/Testing: Your physician has requested that you have an echocardiogram. Echocardiography is a painless test that uses sound waves to create images of your heart. It provides your doctor with information about the size and shape of your heart and how well your heart's chambers and valves are working. This procedure takes approximately one hour. There are no restrictions for this procedure. [facility]    Follow-Up: Your physician wants you to follow-up as needed with Dr. Harrell Gave.  Special Instructions:    Thank you for choosing [facility] at [facility]!!       Signed, Buford Dresser, MD PhD 04/04/2018 10:01 AM    [place]

## 2018-04-04 NOTE — Patient Instructions (Signed)
Medication Instructions: Your physician recommends that you continue on your current medications as directed.    If you need a refill on your cardiac medications before your next appointment, please call your pharmacy.   Labwork: None  Procedures/Testing: Your physician has requested that you have an echocardiogram. Echocardiography is a painless test that uses sound waves to create images of your heart. It provides your doctor with information about the size and shape of your heart and how well your heart's chambers and valves are working. This procedure takes approximately one hour. There are no restrictions for this procedure. [facility]    Follow-Up: Your physician wants you to follow-up as needed with Dr. Harrell Gave.  Special Instructions:    Thank you for choosing Heart[facility] at [facility]!!

## 2018-04-07 ENCOUNTER — Ambulatory Visit (HOSPITAL_BASED_OUTPATIENT_CLINIC_OR_DEPARTMENT_OTHER)
Admission: RE | Admit: 2018-04-07 | Discharge: 2018-04-07 | Disposition: A | Discharge status: Home / Self Care | Payer: Medicare Other | Source: Ambulatory Visit | Attending: Cardiology | Admitting: Cardiology

## 2018-04-07 DIAGNOSIS — I10 Essential (primary) hypertension: Secondary | ICD-10-CM | POA: Insufficient documentation

## 2018-04-07 DIAGNOSIS — E785 Hyperlipidemia, unspecified: Secondary | ICD-10-CM | POA: Insufficient documentation

## 2018-04-07 DIAGNOSIS — I340 Nonrheumatic mitral (valve) insufficiency: Secondary | ICD-10-CM | POA: Diagnosis not present

## 2018-04-07 DIAGNOSIS — E119 Type 2 diabetes mellitus without complications: Secondary | ICD-10-CM | POA: Diagnosis not present

## 2018-04-07 DIAGNOSIS — R600 Localized edema: Secondary | ICD-10-CM | POA: Diagnosis not present

## 2018-04-07 NOTE — Progress Notes (Signed)
  Echocardiogram 2D Echocardiogram has been performed.  Paloma [place]ange T Phil Corti 04/07/2018, 11:24 AM

## 2018-04-08 ENCOUNTER — Telehealth: Payer: Self-pay | Admitting: Cardiology

## 2018-04-08 NOTE — Telephone Encounter (Signed)
New Message ° ° °Patient is returning call in reference to echo results. Please call.  °

## 2018-04-08 NOTE — Telephone Encounter (Signed)
Pt updated with test results along with Dr. Judeth Cornfield recommedation. Verbalized understanding.

## 2018-04-11 ENCOUNTER — Encounter: Payer: Self-pay | Admitting: Internal Medicine

## 2018-04-11 ENCOUNTER — Ambulatory Visit: Payer: Self-pay | Admitting: Internal Medicine

## 2018-04-11 ENCOUNTER — Ambulatory Visit: Payer: Medicare Other | Admitting: Internal Medicine

## 2018-04-11 DIAGNOSIS — I872 Venous insufficiency (chronic) (peripheral): Secondary | ICD-10-CM

## 2018-04-11 DIAGNOSIS — I10 Essential (primary) hypertension: Secondary | ICD-10-CM | POA: Diagnosis not present

## 2018-04-11 DIAGNOSIS — R197 Diarrhea, unspecified: Secondary | ICD-10-CM

## 2018-04-11 MED ORDER — LISINOPRIL-HYDROCHLOROTHIAZIDE 20-12.5 MG PO TABS: 1.0000 | ORAL_TABLET | Freq: Every day | ORAL | 3 refills | Status: DC

## 2018-04-11 NOTE — Progress Notes (Signed)
   Subjective:    Patient ID: Tracy Hammond, female    DOB: 18-Oct-1940, 77 y.o.   MRN: [id number]  HPI The patient is a 77 YO female coming in for several concerns including hypertension (BP is high and has been running high at home, recent diastolic dysfunction on ECHO, denies headaches or chest pains, some swelling in her legs), leg swelling (recently found diastolic dysfunction on echo, systolic function normal, swelling present for several weeks) and diarrhea (started in the last several years, previously some BM after meals, in the last several months lots of diarrhea and she cannot leave the house without accident if she has eaten, denies blood in stool).   Review of Systems  Constitutional: Negative.   HENT: Negative.   Eyes: Negative.   Respiratory: Negative for cough, chest tightness and shortness of breath.   Cardiovascular: Positive for leg swelling. Negative for chest pain and palpitations.  Gastrointestinal: Positive for diarrhea. Negative for abdominal distention, abdominal pain, constipation, nausea and vomiting.  Musculoskeletal: Negative.   Skin: Negative.   Neurological: Negative.   Psychiatric/Behavioral: Negative.       Objective:   Physical Exam  Constitutional: She is oriented to person, place, and time. She appears well-developed and well-nourished.  HENT:  Head: Normocephalic and atraumatic.  Eyes: EOM are normal.  Neck: Normal range of motion.  Cardiovascular: Normal rate and regular rhythm.  Pulmonary/Chest: Effort normal and breath sounds normal. No respiratory distress. She has no wheezes. She has no rales.  Abdominal: Soft. Bowel sounds are normal. She exhibits no distension. There is no tenderness. There is no rebound.  Musculoskeletal: She exhibits edema.  1+ edema bilaterally, non-pitting  Neurological: She is alert and oriented to person, place, and time. Coordination normal.  Skin: Skin is warm and dry.  Psychiatric: She has a normal mood and  affect.   Vitals:   04/11/18 1431  BP: (!) 166/60  Pulse: 81  Temp: 98.2 F (36.8 C)  TempSrc: Oral  SpO2: 95%  Weight: 169 lb (76.7 kg)  Height: 5' 1.5" (1.562 m)      Assessment & Plan:

## 2018-04-11 NOTE — Assessment & Plan Note (Signed)
Change fosinopril to lisinopril/hctz 20/12.5 mg to help with BP and venous insufficiency.

## 2018-04-11 NOTE — Telephone Encounter (Signed)
Pt c/o bilateral lower leg edema and BP elevated to 179/76. BP was taken on Saturday. Pt denies the leg swelling looks red and infected. Pt denies fever, chest pain or difficulty breathing. Pt believes her leg edema is caused by her hypertension.  Pt was seen in office 03/30/18 to evaluate her edema. Pt was referred to cardiology and pt was told that pt needed better BP control. Pt has not missed any doses of her monopril. Pt stated that she has had more headache and more fatigue in the last month. Care advice given and pt verbalized understanding. Appt made for pt today at 2:40 pm today.   Reason for Disposition . [1] MODERATE leg swelling (e.g., swelling extends up to knees) AND [2] new onset or worsening . Systolic BP  >= 468 OR Diastolic >= 032  Answer Assessment - Initial Assessment Questions 1. ONSET: "When did the swelling start?" (e.g., minutes, hours, days)     Mid July 2. LOCATION: "What part of the leg is swollen?"  "Are both legs swollen or just one leg?"     Below knee feet and ankles- both legs  3. SEVERITY: "How bad is the swelling?" (e.g., localized; mild, moderate, severe)  - Localized - small area of swelling localized to one leg  - MILD pedal edema - swelling limited to foot and ankle, pitting edema < 1/4 inch (6 mm) deep, rest and elevation eliminate most or all swelling  - MODERATE edema - swelling of lower leg to knee, pitting edema > 1/4 inch (6 mm) deep, rest and elevation only partially reduce swelling  - SEVERE edema - swelling extends above knee, facial or hand swelling present      moderate 4. REDNESS: "Does the swelling look red or infected?"     no 5. PAIN: "Is the swelling painful to touch?" If so, ask: "How painful is it?"   (Scale 1-10; mild, moderate or severe)     no 6. FEVER: "Do you have a fever?" If so, ask: "What is it, how was it measured, and when did it start?"      no 7. CAUSE: "What do you think is causing the leg swelling?"     hypertension 8.  MEDICAL HISTORY: "Do you have a history of heart failure, kidney disease, liver failure, or cancer?"     no 9. RECURRENT SYMPTOM: "Have you had leg swelling before?" If so, ask: "When was the last time?" "What happened that time?"     No mid July sent to cardiologist  10. OTHER SYMPTOMS: "Do you have any other symptoms?" (e.g., chest pain, difficulty breathing)       no 11. PREGNANCY: "Is there any chance you are pregnant?" "When was your last menstrual period?"       n/a  Answer Assessment - Initial Assessment Questions 1. BLOOD PRESSURE: "What is the blood pressure?" "Did you take at least two measurements 5 minutes apart?"     179/76 2. ONSET: "When did you take your blood pressure?"     Saturday 3. HOW: "How did you obtain the blood pressure?" (e.g., visiting nurse, automatic home BP monitor)      Automatic BP machine 4. HISTORY: "Do you have a history of high blood pressure?"     yes 5. MEDICATIONS: "Are you taking any medications for blood pressure?" "Have you missed any doses recently?"     1 medication- no missed doses 6. OTHER SYMPTOMS: "Do you have any symptoms?" (e.g., headache, chest pain, blurred vision,  difficulty breathing, weakness)     Bilateral lower leg edema, headache more fatigue in the last month  7. PREGNANCY: "Is there any chance you are pregnant?" "When was your last menstrual period?"     n/a  Protocols used: LEG SWELLING AND EDEMA-A-AH, HIGH BLOOD PRESSURE-A-AH

## 2018-04-11 NOTE — Patient Instructions (Addendum)
We have sent in lisinopril/hctz to use 1 pill daily instead of the fosinopril.   We have given you a sample of creon to take 2 pills with meals and 1 pill with snacks to try. You should know within 2-3 days if this will help and let us know.   5

## 2018-04-11 NOTE — Assessment & Plan Note (Signed)
Sample given of creon to see if there is some component of pancreatic insuficiency.

## 2018-04-11 NOTE — Assessment & Plan Note (Signed)
Add HCTZ to her BP for control and fluid control.

## 2018-05-05 DIAGNOSIS — L570 Actinic keratosis: Secondary | ICD-10-CM | POA: Diagnosis not present

## 2018-05-17 ENCOUNTER — Other Ambulatory Visit: Payer: Self-pay | Admitting: Internal Medicine

## 2018-05-24 DIAGNOSIS — H33101 Unspecified retinoschisis, right eye: Secondary | ICD-10-CM | POA: Diagnosis not present

## 2018-05-24 DIAGNOSIS — H353223 Exudative age-related macular degeneration, left eye, with inactive scar: Secondary | ICD-10-CM | POA: Diagnosis not present

## 2018-05-24 DIAGNOSIS — H35423 Microcystoid degeneration of retina, bilateral: Secondary | ICD-10-CM | POA: Diagnosis not present

## 2018-05-24 DIAGNOSIS — H353211 Exudative age-related macular degeneration, right eye, with active choroidal neovascularization: Secondary | ICD-10-CM | POA: Diagnosis not present

## 2018-06-03 ENCOUNTER — Ambulatory Visit (INDEPENDENT_AMBULATORY_CARE_PROVIDER_SITE_OTHER): Payer: Medicare Other

## 2018-06-03 DIAGNOSIS — Z23 Encounter for immunization: Secondary | ICD-10-CM

## 2018-07-01 ENCOUNTER — Other Ambulatory Visit: Payer: Self-pay | Admitting: Internal Medicine

## 2018-08-01 DIAGNOSIS — H401114 Primary open-angle glaucoma, right eye, indeterminate stage: Secondary | ICD-10-CM | POA: Diagnosis not present

## 2018-08-01 DIAGNOSIS — H401123 Primary open-angle glaucoma, left eye, severe stage: Secondary | ICD-10-CM | POA: Diagnosis not present

## 2018-08-03 DIAGNOSIS — H353211 Exudative age-related macular degeneration, right eye, with active choroidal neovascularization: Secondary | ICD-10-CM | POA: Diagnosis not present

## 2018-08-03 DIAGNOSIS — H35423 Microcystoid degeneration of retina, bilateral: Secondary | ICD-10-CM | POA: Diagnosis not present

## 2018-08-03 DIAGNOSIS — H33101 Unspecified retinoschisis, right eye: Secondary | ICD-10-CM | POA: Diagnosis not present

## 2018-08-03 DIAGNOSIS — H353223 Exudative age-related macular degeneration, left eye, with inactive scar: Secondary | ICD-10-CM | POA: Diagnosis not present

## 2018-08-09 ENCOUNTER — Ambulatory Visit: Payer: Medicare Other | Admitting: Internal Medicine

## 2018-08-09 ENCOUNTER — Encounter: Payer: Self-pay | Admitting: Internal Medicine

## 2018-08-09 VITALS — BP 142/68 | HR 81 | Temp 98.4°F | Ht 61.5 in | Wt 159.0 lb

## 2018-08-09 DIAGNOSIS — R05 Cough: Secondary | ICD-10-CM | POA: Diagnosis not present

## 2018-08-09 DIAGNOSIS — R059 Cough, unspecified: Secondary | ICD-10-CM

## 2018-08-09 MED ORDER — DOXYCYCLINE HYCLATE 100 MG PO TABS
100.0000 mg | ORAL_TABLET | Freq: Two times a day (BID) | ORAL | 0 refills | Status: DC
Start: 2018-08-09 — End: 2018-09-13

## 2018-08-09 MED ORDER — METHYLPREDNISOLONE ACETATE 80 MG/ML IJ SUSP
80.0000 mg | Freq: Once | INTRAMUSCULAR | Status: AC
  Administered 2018-08-09: 80 mg via INTRAMUSCULAR

## 2018-08-09 NOTE — Assessment & Plan Note (Signed)
Depo-medrol 80 mg IM given at visit and rx for doxycycline for likely flare of COPD. She is encouraged to stop smoking and she is not sure that she can do this.

## 2018-08-09 NOTE — Patient Instructions (Signed)
We have sent in doxycycline to take 1 pill twice a day for 1 week.   We have given you a shot today.

## 2018-08-09 NOTE — Progress Notes (Signed)
   Subjective:    Patient ID: Tracy Hammond, female    DOB: Oct 03, 1940, 77 y.o.   MRN: [id number]  HPI The patient is a 77 YO female coming in for cough and SOB. She does have COPD and is current smoker. She is still smoking but not quite as much. Denies fevers but some chills. Denies significant sinus drainage. Overall is worsening. Started about 4-5 days ago. Denies having albuterol inhaler. Denies ear pain, some headaches. Does have cough with green sputum.   Review of Systems  Constitutional: Positive for activity change and appetite change. Negative for chills, fatigue, fever and unexpected weight change.  HENT: Positive for congestion, postnasal drip and sinus pressure. Negative for ear discharge, ear pain, rhinorrhea, sinus pain, sneezing, sore throat, tinnitus, trouble swallowing and voice change.   Eyes: Negative.   Respiratory: Positive for cough and shortness of breath. Negative for chest tightness and wheezing.   Cardiovascular: Negative.   Gastrointestinal: Negative.   Musculoskeletal: Positive for myalgias.  Neurological: Positive for headaches.      Objective:   Physical Exam  Constitutional: She is oriented to person, place, and time. She appears well-developed and well-nourished.  HENT:  Head: Normocephalic and atraumatic.  Oropharynx with redness and clear drainage, nose with swollen turbinates, TMs normal bilaterally  Eyes: EOM are normal.  Neck: Normal range of motion. No thyromegaly present.  Cardiovascular: Normal rate and regular rhythm.  Pulmonary/Chest: Effort normal. No respiratory distress. She has wheezes. She has no rales. She exhibits tenderness.  Inspiratory and expiratory wheezing with some crackles as well at the bases bilaterally.   Abdominal: Soft. Bowel sounds are normal. She exhibits no distension. There is no tenderness. There is no rebound.  Musculoskeletal: She exhibits tenderness. She exhibits no edema.  Lymphadenopathy:    She has no cervical  adenopathy.  Neurological: She is alert and oriented to person, place, and time. Coordination normal.  Skin: Skin is warm and dry.  Psychiatric: She has a normal mood and affect.   Vitals:   08/09/18 1421  BP: (!) 142/68  Pulse: 81  Temp: 98.4 F (36.9 C)  TempSrc: Oral  SpO2: 94%  Weight: 159 lb (72.1 kg)  Height: 5' 1.5" (1.562 m)      Assessment & Plan:

## 2018-08-16 DIAGNOSIS — Z1231 Encounter for screening mammogram for malignant neoplasm of breast: Secondary | ICD-10-CM | POA: Diagnosis not present

## 2018-08-16 DIAGNOSIS — Z01419 Encounter for gynecological examination (general) (routine) without abnormal findings: Secondary | ICD-10-CM | POA: Diagnosis not present

## 2018-09-01 DIAGNOSIS — L570 Actinic keratosis: Secondary | ICD-10-CM | POA: Diagnosis not present

## 2018-09-05 ENCOUNTER — Other Ambulatory Visit: Payer: Self-pay | Admitting: Internal Medicine

## 2018-09-13 ENCOUNTER — Other Ambulatory Visit (INDEPENDENT_AMBULATORY_CARE_PROVIDER_SITE_OTHER): Payer: Medicare Other

## 2018-09-13 ENCOUNTER — Ambulatory Visit (INDEPENDENT_AMBULATORY_CARE_PROVIDER_SITE_OTHER): Payer: Medicare Other | Admitting: Internal Medicine

## 2018-09-13 ENCOUNTER — Encounter: Payer: Self-pay | Admitting: Internal Medicine

## 2018-09-13 VITALS — BP 130/60 | HR 78 | Temp 97.7°F | Ht 61.5 in | Wt 154.0 lb

## 2018-09-13 DIAGNOSIS — G2581 Restless legs syndrome: Secondary | ICD-10-CM | POA: Diagnosis not present

## 2018-09-13 DIAGNOSIS — E785 Hyperlipidemia, unspecified: Secondary | ICD-10-CM

## 2018-09-13 DIAGNOSIS — Z0000 Encounter for general adult medical examination without abnormal findings: Secondary | ICD-10-CM

## 2018-09-13 DIAGNOSIS — E118 Type 2 diabetes mellitus with unspecified complications: Secondary | ICD-10-CM

## 2018-09-13 DIAGNOSIS — R197 Diarrhea, unspecified: Secondary | ICD-10-CM

## 2018-09-13 DIAGNOSIS — F17210 Nicotine dependence, cigarettes, uncomplicated: Secondary | ICD-10-CM | POA: Diagnosis not present

## 2018-09-13 DIAGNOSIS — E1169 Type 2 diabetes mellitus with other specified complication: Secondary | ICD-10-CM

## 2018-09-13 DIAGNOSIS — E119 Type 2 diabetes mellitus without complications: Secondary | ICD-10-CM

## 2018-09-13 DIAGNOSIS — J410 Simple chronic bronchitis: Secondary | ICD-10-CM

## 2018-09-13 DIAGNOSIS — I10 Essential (primary) hypertension: Secondary | ICD-10-CM

## 2018-09-13 LAB — COMPREHENSIVE METABOLIC PANEL
ALBUMIN: 4.2 g/dL (ref 3.5–5.2)
ALT: 24 U/L (ref 0–35)
AST: 31 U/L (ref 0–37)
Alkaline Phosphatase: 80 U/L (ref 39–117)
BUN: 21 mg/dL (ref 6–23)
CO2: 27 mEq/L (ref 19–32)
Calcium: 9.9 mg/dL (ref 8.4–10.5)
Chloride: 100 mEq/L (ref 96–112)
Creatinine, Ser: 0.57 mg/dL (ref 0.40–1.20)
GFR: 109.79 mL/min (ref >60.00)
Glucose, Bld: 186 mg/dL — ABNORMAL HIGH (ref 70–99)
Potassium: 4.2 mEq/L (ref 3.5–5.1)
Sodium: 138 mEq/L (ref 135–145)
Total Bilirubin: 0.5 mg/dL (ref 0.2–1.2)
Total Protein: 7 g/dL (ref 6.0–8.3)

## 2018-09-13 LAB — VITAMIN B12: Vitamin B-12: 291 pg/mL (ref 211–911)

## 2018-09-13 LAB — CBC
HEMATOCRIT: 42.6 % (ref 36.0–46.0)
Hemoglobin: 14.6 g/dL (ref 12.0–15.0)
MCHC: 34.2 g/dL (ref 30.0–36.0)
MCV: 95.4 fl (ref 78.0–100.0)
Platelets: 249 10*3/uL (ref 150.0–400.0)
RBC: 4.47 Mil/uL (ref 3.87–5.11)
RDW: 13.3 % (ref 11.5–15.5)
WBC: 10.2 10*3/uL (ref 4.0–10.5)

## 2018-09-13 LAB — TSH: TSH: 1.86 u[IU]/mL (ref 0.35–4.50)

## 2018-09-13 LAB — LIPID PANEL
CHOLESTEROL: 127 mg/dL (ref 0–200)
HDL: 42.5 mg/dL (ref >39.00)
LDL Cholesterol: 53 mg/dL (ref 0–99)
NonHDL: 84.67
Total CHOL/HDL Ratio: 3
Triglycerides: 156 mg/dL — ABNORMAL HIGH (ref 0.0–149.0)
VLDL: 31.2 mg/dL (ref 0.0–40.0)

## 2018-09-13 LAB — HEMOGLOBIN A1C: Hgb A1c MFr Bld: 8.4 % — ABNORMAL HIGH (ref 4.6–6.5)

## 2018-09-13 LAB — MICROALBUMIN / CREATININE URINE RATIO
Creatinine,U: 121.2 mg/dL
Microalb Creat Ratio: 1.3 mg/g (ref 0.0–30.0)
Microalb, Ur: 1.6 mg/dL (ref 0.0–1.9)

## 2018-09-13 LAB — MAGNESIUM: Magnesium: 1.6 mg/dL (ref 1.5–2.5)

## 2018-09-13 LAB — FERRITIN: Ferritin: 104.4 ng/mL (ref 10.0–291.0)

## 2018-09-13 LAB — VITAMIN D 25 HYDROXY (VIT D DEFICIENCY, FRACTURES): VITD: 51.09 ng/mL (ref 30.00–100.00)

## 2018-09-13 NOTE — Assessment & Plan Note (Signed)
Foot exam done, checking HgA1c, microalbumin to creatinine ratio. Not on statin but on ACE-I. Taking metformin BID and adjust as needed.

## 2018-09-13 NOTE — Assessment & Plan Note (Signed)
BP at goal on lisinopril/hctz and checking CMP and adjust as needed.  

## 2018-09-13 NOTE — Progress Notes (Addendum)
   Subjective:   Patient ID: Tracy Hammond, female    DOB: Jan 10, 1941, 78 y.o.   MRN: [id number]  HPI Here for medicare wellness and physical, no new complaints. Please see A/P for status and treatment of chronic medical problems.   Diet: DM since diabetic Physical activity: sedentary Depression/mood screen: negative Hearing: intact to whispered voice with mild loss bilateral Visual acuity: grossly normal, performs annual eye exam, Dr. Sherlynn Stalls ADLs: capable Fall risk: none Home safety: good Cognitive evaluation: intact to orientation, naming, recall and repetition EOL planning: adv directives discussed    Office Visit from 09/13/2018 in [place]  PHQ-2 Total Score  1      I have personally reviewed and have noted 1. The patient's medical and social history - reviewed today no changes 2. Their use of alcohol, tobacco or illicit drugs 3. Their current medications and supplements 4. The patient's functional ability including ADL's, fall risks, home safety risks and hearing or visual impairment. 5. Diet and physical activities 6. Evidence for depression or mood disorders 7. Care team reviewed and updated (available in snapshot)  Review of Systems  Constitutional: Positive for activity change and fatigue.  HENT: Negative.   Eyes: Negative.   Respiratory: Positive for cough and shortness of breath. Negative for chest tightness.   Cardiovascular: Negative for chest pain, palpitations and leg swelling.  Gastrointestinal: Negative for abdominal distention, abdominal pain, constipation, diarrhea, nausea and vomiting.  Musculoskeletal: Positive for myalgias.  Skin: Negative.   Neurological: Negative.   Psychiatric/Behavioral: Negative.     Objective:  Physical Exam Constitutional:      Appearance: She is well-developed.  HENT:     Head: Normocephalic and atraumatic.  Neck:     Musculoskeletal: Normal range of motion.  Cardiovascular:   Rate and Rhythm: Normal rate and regular rhythm.  Pulmonary:     Effort: Pulmonary effort is normal. No respiratory distress.     Breath sounds: Normal breath sounds. No wheezing or rales.     Comments: Lung exam stable, smelling of tobacco Abdominal:     General: Bowel sounds are normal. There is no distension.     Palpations: Abdomen is soft.     Tenderness: There is no abdominal tenderness. There is no rebound.  Musculoskeletal:        General: Tenderness present.  Skin:    General: Skin is warm and dry.  Neurological:     Mental Status: She is alert and oriented to person, place, and time.     Coordination: Coordination normal.     Vitals:   09/13/18 0816  BP: 130/60  Pulse: 78  Temp: 97.7 F (36.5 C)  TempSrc: Oral  SpO2: 95%  Weight: 154 lb (69.9 kg)  Height: 5' 1.5" (1.562 m)    Assessment & Plan:

## 2018-09-13 NOTE — Assessment & Plan Note (Signed)
Flu shot up to date. Pneumonia complete. Shingrix counseled. Tetanus dcelines. Colonoscopy up to date. Mammogram up to date, pap smear aged out and dexa up to date. Counseled about sun safety and mole surveillance. Counseled about the dangers of distracted driving. Given 10 year screening recommendations.

## 2018-09-13 NOTE — Assessment & Plan Note (Signed)
Time spent counseling about tobacco usage: 3 minutes. I have asked about smoking and is smoking same as usual. The patient is advised to quit. The patient is not willing to quit. They would like to try to quit in the next 12 months. We will follow up with them in 12 months.  

## 2018-09-13 NOTE — Assessment & Plan Note (Signed)
Stable using imodium otc for diarrhea with reasonable success.

## 2018-09-13 NOTE — Assessment & Plan Note (Signed)
With more myalgia and with chronic smoking and other vascular risks could have PVD. Checking labs today and if no etiology will get ABI.

## 2018-09-13 NOTE — Assessment & Plan Note (Signed)
Checking lipid panel and adjust as needed. Not on statin due to leg muscle cramps.

## 2018-09-13 NOTE — Patient Instructions (Signed)
We are checking the labs today and will check the blood flow in the legs if we cannot find any problems.   Health Maintenance, Female Adopting a healthy lifestyle and getting preventive care can go a long way to promote health and wellness. Talk with your health care provider about what schedule of regular examinations is right for you. This is a good chance for you to check in with your provider about disease prevention and staying healthy. In between checkups, there are plenty of things you can do on your own. Experts have done a lot of research about which lifestyle changes and preventive measures are most likely to keep you healthy. Ask your health care provider for more information. Weight and diet Eat a healthy diet  Be sure to include plenty of vegetables, fruits, low-fat dairy products, and lean protein.  Do not eat a lot of foods high in solid fats, added sugars, or salt.  Get regular exercise. This is one of the most important things you can do for your health. ? Most adults should exercise for at least 150 minutes each week. The exercise should increase your heart rate and make you sweat (moderate-intensity exercise). ? Most adults should also do strengthening exercises at least twice a week. This is in addition to the moderate-intensity exercise. Maintain a healthy weight  Body mass index (BMI) is a measurement that can be used to identify possible weight problems. It estimates body fat based on height and weight. Your health care provider can help determine your BMI and help you achieve or maintain a healthy weight.  For females 72 years of age and older: ? A BMI below 18.5 is considered underweight. ? A BMI of 18.5 to 24.9 is normal. ? A BMI of 25 to 29.9 is considered overweight. ? A BMI of 30 and above is considered obese. Watch levels of cholesterol and blood lipids  You should start having your blood tested for lipids and cholesterol at 76 years of age, then have this test  every 5 years.  You may need to have your cholesterol levels checked more often if: ? Your lipid or cholesterol levels are high. ? You are older than 76 years of age. ? You are at high risk for heart disease. Cancer screening Lung Cancer  Lung cancer screening is recommended for adults 72-33 years old who are at high risk for lung cancer because of a history of smoking.  A yearly low-dose CT scan of the lungs is recommended for people who: ? Currently smoke. ? Have quit within the past 15 years. ? Have at least a 30-pack-year history of smoking. A pack year is smoking an average of one pack of cigarettes a day for 1 year.  Yearly screening should continue until it has been 15 years since you quit.  Yearly screening should stop if you develop a health problem that would prevent you from having lung cancer treatment. Breast Cancer  Practice breast self-awareness. This means understanding how your breasts normally appear and feel.  It also means doing regular breast self-exams. Let your health care provider know about any changes, no matter how small.  If you are in your 20s or 30s, you should have a clinical breast exam (CBE) by a health care provider every 1-3 years as part of a regular health exam.  If you are 68 or older, have a CBE every year. Also consider having a breast X-ray (mammogram) every year.  If you have a family history  of breast cancer, talk to your health care provider about genetic screening.  If you are at high risk for breast cancer, talk to your health care provider about having an MRI and a mammogram every year.  Breast cancer gene (BRCA) assessment is recommended for women who have family members with BRCA-related cancers. BRCA-related cancers include: ? Breast. ? Ovarian. ? Tubal. ? Peritoneal cancers.  Results of the assessment will determine the need for genetic counseling and BRCA1 and BRCA2 testing. Cervical Cancer Your health care provider may  recommend that you be screened regularly for cancer of the pelvic organs (ovaries, uterus, and vagina). This screening involves a pelvic examination, including checking for microscopic changes to the surface of your cervix (Pap test). You may be encouraged to have this screening done every 3 years, beginning at age 74.  For women ages 51-65, health care providers may recommend pelvic exams and Pap testing every 3 years, or they may recommend the Pap and pelvic exam, combined with testing for human papilloma virus (HPV), every 5 years. Some types of HPV increase your risk of cervical cancer. Testing for HPV may also be done on women of any age with unclear Pap test results.  Other health care providers may not recommend any screening for nonpregnant women who are considered low risk for pelvic cancer and who do not have symptoms. Ask your health care provider if a screening pelvic exam is right for you.  If you have had past treatment for cervical cancer or a condition that could lead to cancer, you need Pap tests and screening for cancer for at least 20 years after your treatment. If Pap tests have been discontinued, your risk factors (such as having a new sexual partner) need to be reassessed to determine if screening should resume. Some women have medical problems that increase the chance of getting cervical cancer. In these cases, your health care provider may recommend more frequent screening and Pap tests. Colorectal Cancer  This type of cancer can be detected and often prevented.  Routine colorectal cancer screening usually begins at 76 years of age and continues through 76 years of age.  Your health care provider may recommend screening at an earlier age if you have risk factors for colon cancer.  Your health care provider may also recommend using home test kits to check for hidden blood in the stool.  A small camera at the end of a tube can be used to examine your colon directly  (sigmoidoscopy or colonoscopy). This is done to check for the earliest forms of colorectal cancer.  Routine screening usually begins at age 59.  Direct examination of the colon should be repeated every 5-10 years through 76 years of age. However, you may need to be screened more often if early forms of precancerous polyps or small growths are found. Skin Cancer  Check your skin from head to toe regularly.  Tell your health care provider about any new moles or changes in moles, especially if there is a change in a mole's shape or color.  Also tell your health care provider if you have a mole that is larger than the size of a pencil eraser.  Always use sunscreen. Apply sunscreen liberally and repeatedly throughout the day.  Protect yourself by wearing long sleeves, pants, a wide-brimmed hat, and sunglasses whenever you are outside. Heart disease, diabetes, and high blood pressure  High blood pressure causes heart disease and increases the risk of stroke. High blood pressure is more  likely to develop in: ? People who have blood pressure in the high end of the normal range (130-139/85-89 mm Hg). ? People who are overweight or obese. ? People who are African American.  If you are 70-23 years of age, have your blood pressure checked every 3-5 years. If you are 5 years of age or older, have your blood pressure checked every year. You should have your blood pressure measured twice-once when you are at a hospital or clinic, and once when you are not at a hospital or clinic. Record the average of the two measurements. To check your blood pressure when you are not at a hospital or clinic, you can use: ? An automated blood pressure machine at a pharmacy. ? A home blood pressure monitor.  If you are between 14 years and 28 years old, ask your health care provider if you should take aspirin to prevent strokes.  Have regular diabetes screenings. This involves taking a blood sample to check your  fasting blood sugar level. ? If you are at a normal weight and have a low risk for diabetes, have this test once every three years after 76 years of age. ? If you are overweight and have a high risk for diabetes, consider being tested at a younger age or more often. Preventing infection Hepatitis B  If you have a higher risk for hepatitis B, you should be screened for this virus. You are considered at high risk for hepatitis B if: ? You were born in a country where hepatitis B is common. Ask your health care provider which countries are considered high risk. ? Your parents were born in a high-risk country, and you have not been immunized against hepatitis B (hepatitis B vaccine). ? You have HIV or AIDS. ? You use needles to inject street drugs. ? You live with someone who has hepatitis B. ? You have had sex with someone who has hepatitis B. ? You get hemodialysis treatment. ? You take certain medicines for conditions, including cancer, organ transplantation, and autoimmune conditions. Hepatitis C  Blood testing is recommended for: ? Everyone born from [birth year] through 1963. ? Anyone with known risk factors for hepatitis C. Sexually transmitted infections (STIs)  You should be screened for sexually transmitted infections (STIs) including gonorrhea and chlamydia if: ? You are sexually active and are younger than 76 years of age. ? You are older than 76 years of age and your health care provider tells you that you are at risk for this type of infection. ? Your sexual activity has changed since you were last screened and you are at an increased risk for chlamydia or gonorrhea. Ask your health care provider if you are at risk.  If you do not have HIV, but are at risk, it may be recommended that you take a prescription medicine daily to prevent HIV infection. This is called pre-exposure prophylaxis (PrEP). You are considered at risk if: ? You are sexually active and do not regularly use condoms or  know the HIV status of your partner(s). ? You take drugs by injection. ? You are sexually active with a partner who has HIV. Talk with your health care provider about whether you are at high risk of being infected with HIV. If you choose to begin PrEP, you should first be tested for HIV. You should then be tested every 3 months for as long as you are taking PrEP. Pregnancy  If you are premenopausal and you may become pregnant, ask  your health care provider about preconception counseling.  If you may become pregnant, take 400 to 800 micrograms (mcg) of folic acid every day.  If you want to prevent pregnancy, talk to your health care provider about birth control (contraception). Osteoporosis and menopause  Osteoporosis is a disease in which the bones lose minerals and strength with aging. This can result in serious bone fractures. Your risk for osteoporosis can be identified using a bone density scan.  If you are 63 years of age or older, or if you are at risk for osteoporosis and fractures, ask your health care provider if you should be screened.  Ask your health care provider whether you should take a calcium or vitamin D supplement to lower your risk for osteoporosis.  Menopause may have certain physical symptoms and risks.  Hormone replacement therapy may reduce some of these symptoms and risks. Talk to your health care provider about whether hormone replacement therapy is right for you. Follow these instructions at home:  Schedule regular health, dental, and eye exams.  Stay current with your immunizations.  Do not use any tobacco products including cigarettes, chewing tobacco, or electronic cigarettes.  If you are pregnant, do not drink alcohol.  If you are breastfeeding, limit how much and how often you drink alcohol.  Limit alcohol intake to no more than 1 drink per day for nonpregnant women. One drink equals 12 ounces of beer, 5 ounces of wine, or 1 ounces of hard  liquor.  Do not use street drugs.  Do not share needles.  Ask your health care provider for help if you need support or information about quitting drugs.  Tell your health care provider if you often feel depressed.  Tell your health care provider if you have ever been abused or do not feel safe at home. This information is not intended to replace advice given to you by your health care provider. Make sure you discuss any questions you have with your health care provider. Document Released: 03/02/2011 Document Revised: 01/23/2016 Document Reviewed: 05/21/2015 Elsevier Interactive Patient Education  2019 [employer].

## 2018-09-28 ENCOUNTER — Ambulatory Visit (INDEPENDENT_AMBULATORY_CARE_PROVIDER_SITE_OTHER): Payer: Medicare Other

## 2018-09-28 ENCOUNTER — Encounter: Payer: Self-pay | Admitting: Family Medicine

## 2018-09-28 ENCOUNTER — Other Ambulatory Visit: Payer: Self-pay

## 2018-09-28 ENCOUNTER — Ambulatory Visit (INDEPENDENT_AMBULATORY_CARE_PROVIDER_SITE_OTHER): Payer: Medicare Other | Admitting: Family Medicine

## 2018-09-28 VITALS — BP 162/58 | HR 94 | Temp 99.2°F | Ht 61.5 in | Wt 159.9 lb

## 2018-09-28 DIAGNOSIS — R509 Fever, unspecified: Secondary | ICD-10-CM | POA: Diagnosis not present

## 2018-09-28 DIAGNOSIS — R059 Cough, unspecified: Secondary | ICD-10-CM

## 2018-09-28 DIAGNOSIS — R52 Pain, unspecified: Secondary | ICD-10-CM | POA: Diagnosis not present

## 2018-09-28 DIAGNOSIS — R05 Cough: Secondary | ICD-10-CM | POA: Diagnosis not present

## 2018-09-28 LAB — POCT INFLUENZA A/B
Influenza A, POC: NEGATIVE
Influenza B, POC: NEGATIVE

## 2018-09-28 MED ORDER — DOXYCYCLINE HYCLATE 100 MG PO CAPS: 100.0000 mg | ORAL_CAPSULE | Freq: Two times a day (BID) | ORAL | 0 refills | Status: DC

## 2018-09-28 NOTE — Progress Notes (Signed)
  Subjective:     Patient ID: Tracy Hammond, female   DOB: [birth year], 78 y.o.   MRN: [id number]  HPI Patient seen with acute onset this morning of weakness, generalized fatigue, generalized body aches, chills, and cough.  She had some milder symptoms last night but worsening this morning.  She had flu vaccine this past fall.  Denies any nausea or vomiting.  Keeping down fluids well.  No known sick contacts.  No dysuria.  No known drug allergies.   Past Medical History:  Diagnosis Date  . Allergy    seasonal  . Arthritis    fingers  . Bursitis   . Cancer ([place])    skin cancer arm and face  . Diabetes mellitus   . High cholesterol   . Hypertension   . Personal history of colonic adenoma 06/12/2008  . Thyroid disease    young adult-no meds now   Past Surgical History:  Procedure Laterality Date  . Bladder Tact    . CATARACT EXTRACTION  09-07-2012   rt eye  . CATARACT EXTRACTION Right 08/2012  . CHOLECYSTECTOMY    . COLONOSCOPY    . SKIN CANCER EXCISION    . TONSILLECTOMY AND ADENOIDECTOMY    . TUBAL LIGATION      reports that she has been smoking cigarettes. She has a 30.00 pack-year smoking history. She has never used smokeless tobacco. She reports that she does not drink alcohol or use drugs. family history includes Diabetes in her mother; Heart disease in her mother and son; Hypertension in her father; Kidney failure in her mother; Other in her sister; Stroke in her sister. No Known Allergies   Review of Systems  Constitutional: Positive for chills, fatigue and fever.  HENT: Positive for congestion.   Respiratory: Positive for cough.   Cardiovascular: Negative for chest pain.       Objective:   Physical Exam HENT:     Right Ear: Tympanic membrane normal.     Left Ear: Tympanic membrane normal.     Mouth/Throat:     Mouth: Mucous membranes are moist.     Pharynx: Oropharynx is clear. No oropharyngeal exudate.  Neck:     Musculoskeletal: Neck supple.   Cardiovascular:     Rate and Rhythm: Normal rate.  Pulmonary:     Effort: Pulmonary effort is normal.     Breath sounds: Normal breath sounds. No rales.  Neurological:     Mental Status: She is alert.        Assessment:     Acute respiratory illness.  Rule out influenza (Flu screen negative)    Plan:     -Obtaine PA/lat CXR -start Doxycycline 100 mg po bid for 10 days. -plenty of fluids and rest. -follow up for any increased dyspnea or other concerns  Eulas Post MD [facility]

## 2018-09-28 NOTE — Patient Instructions (Signed)
Follow up for any increased shortness of breath, vomiting, or confusion

## 2018-10-18 DIAGNOSIS — H353211 Exudative age-related macular degeneration, right eye, with active choroidal neovascularization: Secondary | ICD-10-CM | POA: Diagnosis not present

## 2018-10-18 DIAGNOSIS — H353223 Exudative age-related macular degeneration, left eye, with inactive scar: Secondary | ICD-10-CM | POA: Diagnosis not present

## 2018-10-18 DIAGNOSIS — H35371 Puckering of macula, right eye: Secondary | ICD-10-CM | POA: Diagnosis not present

## 2018-10-18 DIAGNOSIS — H35423 Microcystoid degeneration of retina, bilateral: Secondary | ICD-10-CM | POA: Diagnosis not present

## 2018-10-18 LAB — HM DIABETES EYE EXAM

## 2018-11-10 ENCOUNTER — Other Ambulatory Visit: Payer: Self-pay | Admitting: Internal Medicine

## 2018-12-20 ENCOUNTER — Encounter: Payer: Self-pay | Admitting: Internal Medicine

## 2018-12-20 ENCOUNTER — Telehealth: Payer: Self-pay

## 2018-12-20 ENCOUNTER — Ambulatory Visit (INDEPENDENT_AMBULATORY_CARE_PROVIDER_SITE_OTHER): Payer: Medicare Other | Admitting: Internal Medicine

## 2018-12-20 DIAGNOSIS — R21 Rash and other nonspecific skin eruption: Secondary | ICD-10-CM

## 2018-12-20 MED ORDER — MIRTAZAPINE 15 MG PO TABS: 15.0000 mg | ORAL_TABLET | Freq: Every day | ORAL | 3 refills | Status: DC

## 2018-12-20 MED ORDER — NYSTATIN-TRIAMCINOLONE 100000-0.1 UNIT/GM-% EX OINT: 1.0000 "application " | TOPICAL_OINTMENT | Freq: Two times a day (BID) | CUTANEOUS | 0 refills | Status: DC

## 2018-12-20 MED ORDER — FLUCONAZOLE 150 MG PO TABS: 150.0000 mg | ORAL_TABLET | Freq: Once | ORAL | 0 refills | Status: AC

## 2018-12-20 NOTE — Assessment & Plan Note (Signed)
Rx for diflucan and tnystatin/triamcinolone cream to help.

## 2018-12-20 NOTE — Patient Instructions (Signed)
We have given you the diflucan to take to help the rash. We have also sent in a cream to use twice a day for the next 1-2 weeks.   We have sent in remeron (mirtazepine) to use to help balance out the emotions. Take 1 pill at night time. Let us know if this is helping.

## 2018-12-20 NOTE — Telephone Encounter (Signed)
Noted  

## 2018-12-20 NOTE — Telephone Encounter (Signed)
Patient will be calling me back to give me an email address

## 2018-12-20 NOTE — Telephone Encounter (Signed)
Can you see if patient can make a virtual visit. Thank you

## 2018-12-20 NOTE — Telephone Encounter (Signed)
Appointment scheduled.

## 2018-12-20 NOTE — Progress Notes (Signed)
Virtual Visit via Video Note  I connected with Tracy Hammond on 12/20/18 at  3:20 PM EDT by a video enabled telemedicine application and verified that I am speaking with the correct person using two identifiers.   I discussed the limitations of evaluation and management by telemedicine and the availability of in person appointments. The patient expressed understanding and agreed to proceed.  History of Present Illness: The patient is a 78 y.o. female with visit for sensation of warmth under her breasts with pain and rash. Started about 1 month ago. Has tried creams she had at home as well as some otc. Denies fevers or chills or SOB. Breathing is still doing good. Overall it is worsening. Has tried creams as above she is not sure about the names.   Observations/Objective: Appearance: normal, breathing appears normal, casual grooming, abdomen does not appear distended, throat normal, rash not examined due to angle of camera, mental status is A and O times 3  Assessment and Plan: See problem oriented charting  Follow Up Instructions: rx diflucan and nystatin/triamcinolone cream as well as remeron for depression  I discussed the assessment and treatment plan with the patient. The patient was provided an opportunity to ask questions and all were answered. The patient agreed with the plan and demonstrated an understanding of the instructions.   The patient was advised to call back or seek an in-person evaluation if the symptoms worsen or if the condition fails to improve as anticipated.  Hoyt Koch, MD

## 2018-12-20 NOTE — Telephone Encounter (Signed)
Copied from [place] [id number]. Topic: General - Inquiry >> Dec 20, 2018 11:07 AM Virl Axe D wrote: Reason for CRM: Pt stated she has been experiencing heat under her breasts and groin area for about a month. She has tried different over the counter creams, ointments and powders with no relief. She would like to know if there is anything Dr. Sharlet Salina can send in to her pharmacy. Please advise.    [place], [place] [phone] (Phone) [phone] (Fax)

## 2019-01-06 ENCOUNTER — Other Ambulatory Visit: Payer: Self-pay | Admitting: Internal Medicine

## 2019-01-18 ENCOUNTER — Telehealth: Payer: Self-pay | Admitting: Internal Medicine

## 2019-01-18 MED ORDER — TRIAMCINOLONE ACETONIDE 0.1 % EX CREA: 1.0000 "application " | TOPICAL_CREAM | Freq: Two times a day (BID) | CUTANEOUS | 0 refills | Status: DC

## 2019-01-18 NOTE — Telephone Encounter (Signed)
Copied from [place] [id number]. Topic: Quick Communication - Rx Refill/Question >> Jan 18, 2019 10:17 AM Scherrie Gerlach wrote: Medication: triamcinolone cream (KENALOG) 0.1 %  Pt states the rash she had a year ago tries to come back when it is hot outside, under her breast sometimes.  Pt is out of this creamand requesting refill.  [place], [place] [phone] (Phone) [phone] (Fax)

## 2019-01-24 DIAGNOSIS — H353211 Exudative age-related macular degeneration, right eye, with active choroidal neovascularization: Secondary | ICD-10-CM | POA: Diagnosis not present

## 2019-02-06 DIAGNOSIS — H401114 Primary open-angle glaucoma, right eye, indeterminate stage: Secondary | ICD-10-CM | POA: Diagnosis not present

## 2019-02-06 DIAGNOSIS — H401123 Primary open-angle glaucoma, left eye, severe stage: Secondary | ICD-10-CM | POA: Diagnosis not present

## 2019-03-15 ENCOUNTER — Ambulatory Visit (INDEPENDENT_AMBULATORY_CARE_PROVIDER_SITE_OTHER): Payer: Medicare Other | Admitting: Internal Medicine

## 2019-03-15 ENCOUNTER — Encounter: Payer: Self-pay | Admitting: Internal Medicine

## 2019-03-15 ENCOUNTER — Other Ambulatory Visit: Payer: Self-pay

## 2019-03-15 ENCOUNTER — Other Ambulatory Visit (INDEPENDENT_AMBULATORY_CARE_PROVIDER_SITE_OTHER): Payer: Medicare Other

## 2019-03-15 VITALS — BP 142/70 | HR 65 | Temp 97.9°F | Ht 61.5 in | Wt 158.0 lb

## 2019-03-15 DIAGNOSIS — J410 Simple chronic bronchitis: Secondary | ICD-10-CM | POA: Diagnosis not present

## 2019-03-15 DIAGNOSIS — E118 Type 2 diabetes mellitus with unspecified complications: Secondary | ICD-10-CM

## 2019-03-15 DIAGNOSIS — R197 Diarrhea, unspecified: Secondary | ICD-10-CM | POA: Diagnosis not present

## 2019-03-15 LAB — COMPREHENSIVE METABOLIC PANEL
ALT: 30 U/L (ref 0–35)
AST: 42 U/L — ABNORMAL HIGH (ref 0–37)
Albumin: 4.2 g/dL (ref 3.5–5.2)
Alkaline Phosphatase: 78 U/L (ref 39–117)
BUN: 21 mg/dL (ref 6–23)
CO2: 27 mEq/L (ref 19–32)
Calcium: 9.6 mg/dL (ref 8.4–10.5)
Chloride: 102 mEq/L (ref 96–112)
Creatinine, Ser: 0.72 mg/dL (ref 0.40–1.20)
GFR: 78.78 mL/min (ref >60.00)
Glucose, Bld: 170 mg/dL — ABNORMAL HIGH (ref 70–99)
Potassium: 4.4 mEq/L (ref 3.5–5.1)
Sodium: 139 mEq/L (ref 135–145)
Total Bilirubin: 0.7 mg/dL (ref 0.2–1.2)
Total Protein: 6.7 g/dL (ref 6.0–8.3)

## 2019-03-15 LAB — HEMOGLOBIN A1C: Hgb A1c MFr Bld: 8.4 % — ABNORMAL HIGH (ref 4.6–6.5)

## 2019-03-15 MED ORDER — NYSTATIN-TRIAMCINOLONE 100000-0.1 UNIT/GM-% EX OINT: 1.0000 "application " | TOPICAL_OINTMENT | Freq: Two times a day (BID) | CUTANEOUS | 3 refills | Status: DC

## 2019-03-15 NOTE — Patient Instructions (Signed)
We have given you a sample to take 2 pills with meals and 1 pill with snacks. Take it right at the same time as eating.

## 2019-03-15 NOTE — Progress Notes (Signed)
   Subjective:   Patient ID: Tracy Hammond, female    DOB: 1940-10-11, 78 y.o.   MRN: [id number]  HPI The patient is a 78 YO female coming in for follow up smoking (still smoking about the same, denies desire to quit, chronic cough and SOB which does not limit life at this time, has albuterol inhaler if needed), and diabetes (taking metformin, on ACE-I and complicated by neuropathy, denies worsening changes, did have a fall recently due to climbing on chair and this did tip while she was climbing on it) and diarrhea (worse lately and imodium is not working as well, she has tried Theatre manager and other otc which do not work well at all, denies change in diet or known food triggers).   Review of Systems  Constitutional: Negative.   HENT: Negative.   Eyes: Negative.   Respiratory: Positive for cough. Negative for chest tightness and shortness of breath.   Cardiovascular: Negative for chest pain, palpitations and leg swelling.  Gastrointestinal: Negative for abdominal distention, abdominal pain, constipation, diarrhea, nausea and vomiting.  Musculoskeletal: Positive for arthralgias and myalgias.  Skin: Negative.   Neurological: Positive for numbness.  Psychiatric/Behavioral: Negative.     Objective:  Physical Exam Constitutional:      Appearance: She is well-developed. She is obese.  HENT:     Head: Normocephalic and atraumatic.  Neck:     Musculoskeletal: Normal range of motion.  Cardiovascular:     Rate and Rhythm: Normal rate and regular rhythm.  Pulmonary:     Effort: Pulmonary effort is normal. No respiratory distress.     Breath sounds: Rhonchi present. No wheezing or rales.     Comments: Stable lung exam Abdominal:     General: Bowel sounds are normal. There is no distension.     Palpations: Abdomen is soft.     Tenderness: There is no abdominal tenderness. There is no rebound.  Musculoskeletal:        General: Tenderness present.     Comments: No injury noted on back or neck,  some bruising which is fading from prior fall  Skin:    General: Skin is warm and dry.  Neurological:     Mental Status: She is alert and oriented to person, place, and time.     Coordination: Coordination normal.     Vitals:   03/15/19 0814  BP: (!) 142/70  Pulse: 65  Temp: 97.9 F (36.6 C)  TempSrc: Oral  SpO2: 97%  Weight: 158 lb (71.7 kg)  Height: 5' 1.5" (1.562 m)    Assessment & Plan:

## 2019-03-16 ENCOUNTER — Other Ambulatory Visit: Payer: Self-pay | Admitting: Internal Medicine

## 2019-03-16 ENCOUNTER — Telehealth: Payer: Self-pay | Admitting: Internal Medicine

## 2019-03-16 ENCOUNTER — Encounter: Payer: Self-pay | Admitting: Internal Medicine

## 2019-03-16 MED ORDER — GLIPIZIDE 5 MG PO TABS: 5.0000 mg | ORAL_TABLET | Freq: Every day | ORAL | 0 refills | Status: DC

## 2019-03-16 NOTE — Telephone Encounter (Signed)
Patient stated it is okay for the provider to add a medication to her Metformin to bring down her blood sugar.

## 2019-03-16 NOTE — Assessment & Plan Note (Signed)
Given sample of creon to try for the diarrhea. She has had diabetes for many years as well as smoking making pancreatic insufficiency a possibility. If this is effective we will prescribe.

## 2019-03-16 NOTE — Assessment & Plan Note (Signed)
Checking HgA1c and CMP and adjust metformin as needed.

## 2019-03-16 NOTE — Telephone Encounter (Signed)
Taken care of in result notes 

## 2019-03-16 NOTE — Assessment & Plan Note (Signed)
Time spent counseling about tobacco usage: 3 minutes. I have asked about smoking and is smoking more than usual. The patient is advised to quit. The patient is not willing to quit. They would like to try to quit in the next 6 months. We will follow up with them in 6 months.

## 2019-03-16 NOTE — Progress Notes (Signed)
Abstracted and sent to scan  

## 2019-04-03 ENCOUNTER — Other Ambulatory Visit: Payer: Self-pay | Admitting: Internal Medicine

## 2019-04-03 DIAGNOSIS — H401123 Primary open-angle glaucoma, left eye, severe stage: Secondary | ICD-10-CM | POA: Diagnosis not present

## 2019-04-03 DIAGNOSIS — H401114 Primary open-angle glaucoma, right eye, indeterminate stage: Secondary | ICD-10-CM | POA: Diagnosis not present

## 2019-04-12 ENCOUNTER — Ambulatory Visit (INDEPENDENT_AMBULATORY_CARE_PROVIDER_SITE_OTHER): Payer: Medicare Other | Admitting: Orthopaedic Surgery

## 2019-04-12 ENCOUNTER — Ambulatory Visit: Payer: Self-pay

## 2019-04-12 ENCOUNTER — Other Ambulatory Visit: Payer: Self-pay

## 2019-04-12 ENCOUNTER — Encounter: Payer: Self-pay | Admitting: Orthopaedic Surgery

## 2019-04-12 VITALS — Ht 61.5 in | Wt 152.0 lb

## 2019-04-12 DIAGNOSIS — M5431 Sciatica, right side: Secondary | ICD-10-CM

## 2019-04-12 DIAGNOSIS — M4807 Spinal stenosis, lumbosacral region: Secondary | ICD-10-CM

## 2019-04-12 DIAGNOSIS — M79604 Pain in right leg: Secondary | ICD-10-CM | POA: Diagnosis not present

## 2019-04-12 DIAGNOSIS — M1611 Unilateral primary osteoarthritis, right hip: Secondary | ICD-10-CM

## 2019-04-12 MED ORDER — HYDROCODONE-ACETAMINOPHEN 5-325 MG PO TABS: 1.0000 | ORAL_TABLET | Freq: Four times a day (QID) | ORAL | 0 refills | Status: DC | PRN

## 2019-04-12 MED ORDER — GABAPENTIN 100 MG PO CAPS: 100.0000 mg | ORAL_CAPSULE | Freq: Every day | ORAL | 0 refills | Status: DC

## 2019-04-12 NOTE — Progress Notes (Signed)
Office Visit Note   Patient: Tracy Hammond           Date of Birth: 07/26/1941           MRN: [id number] Visit Date: 04/12/2019              Requested by: Hoyt Koch, MD [place],  [place] [id number] PCP: Hoyt Koch, MD   Assessment & Plan: Visit Diagnoses:  1. Pain in right leg   2. Sciatica, right side   3. Unilateral primary osteoarthritis, right hip     Plan: She does have arthritis that is pre-existing in her right hip but the acute nature of her pain today and what she is describing and my clinical exam point to this being more of a radicular issue from her lumbar spine.  Given her loss of lumbar lordosis on her x-rays shows that she is having significant pain.  She is already on muscle relaxants but I am going to add some hydrocodone to this.  I would not put her on a steroid right now given her blood glucose levels and her diabetes.  I would like to obtain an MRI of her lumbar spine to rule out herniated disc or nerve compression that is causing her her radicular symptoms going down her right leg.  All question concerns were answered and addressed.  We will see her back after the MRI of the lumbar spine.  She will only ambulate using a walker or cane for now.  Follow-Up Instructions: Return in about 2 weeks (around 04/26/2019).   Orders:  Orders Placed This Encounter  Procedures  . XR HIP UNILAT W OR W/O PELVIS 1V RIGHT  . XR Lumbar Spine 2-3 Views   Meds ordered this encounter  Medications  . HYDROcodone-acetaminophen (NORCO/VICODIN) 5-325 MG tablet    Sig: Take 1 tablet by mouth every 6 (six) hours as needed for moderate pain.    Dispense:  30 tablet    Refill:  0  . gabapentin (NEURONTIN) 100 MG capsule    Sig: Take 1 capsule (100 mg total) by mouth at bedtime.    Dispense:  30 capsule    Refill:  0      Procedures: No procedures performed   Clinical Data: No additional findings.   Subjective: Chief Complaint  Patient  presents with  . Right Leg - Pain  The patient is worked in today with acute right leg pain.  She describes pain going from her hip down past her knee to her foot.  She is diabetic but denies any significant neuropathy.  We have seen her for right hip arthritis before and she has had intra-articular injections in that right hip.  She says she does not hurt in the groin at all but has had difficulty weightbearing today after waking up in pain.  She said her blood glucose is running in the 140s.  She denies any injury that she is aware of.  She denies any change in bowel or bladder function.  She does not appear uncomfortable but she is in a wheelchair today.  HPI  Review of Systems She currently denies any headache, chest pain, shortness of breath, fever, chills, nausea, vomiting  Objective: Vital Signs: Ht 5' 1.5" (1.562 m)   Wt 152 lb (68.9 kg)   BMI 28.26 kg/m   Physical Exam She is alert and orient x3 and in no acute distress Ortho Exam Examination of her right hip shows that  I can move it through full internal and external rotation with no significant discomfort in the groin area.  She has a positive straight leg raise on the right side and pain when I lift that leg up.  She is got good strength in the leg in general. Specialty Comments:  No specialty comments available.  Imaging: Xr Hip Unilat W Or W/o Pelvis 1v Right  Result Date: 04/12/2019 An AP pelvis and lateral of the right hip shows no acute findings.  There is arthritis in the right hip with joint space narrowing and para-articular osteophytes that is unchanged from previous films.  Xr Lumbar Spine 2-3 Views  Result Date: 04/12/2019 An AP and lateral of the lumbar spine shows no acute findings.  On the lateral view there is loss of the lumbar lordosis and there is significant degenerative changes at several levels.    PMFS History: Patient Active Problem List   Diagnosis Date Noted  . Unilateral primary  osteoarthritis, right hip 12/29/2017  . Rash 10/29/2017  . Smokers' cough ([place]) 11/13/2016  . Routine general medical examination at a health care facility 09/05/2015  . Diarrhea 03/15/2015  . Restless leg syndrome 08/28/2014  . VARICOSE VEINS, LOWER EXTREMITIES 05/14/2008  . Hyperlipidemia associated with type 2 diabetes mellitus ([place]) 04/14/2007  . Diabetes mellitus type 2 with complications ([place]) 76/73/4193  . Essential hypertension 03/29/2007   Past Medical History:  Diagnosis Date  . Allergy    seasonal  . Arthritis    fingers  . Bursitis   . Cancer ([place])    skin cancer arm and face  . Diabetes mellitus   . High cholesterol   . Hypertension   . Personal history of colonic adenoma 06/12/2008  . Thyroid disease    young adult-no meds now    Family History  Problem Relation Age of Onset  . Hypertension Father        family hx  . Diabetes Mother   . Kidney failure Mother   . Heart disease Mother   . Stroke Sister   . Other Sister        Leg Disease  . Heart disease Son   . Colon cancer Neg Hx   . Esophageal cancer Neg Hx   . Rectal cancer Neg Hx   . Stomach cancer Neg Hx     Past Surgical History:  Procedure Laterality Date  . Bladder Tact    . CATARACT EXTRACTION  09-07-2012   rt eye  . CATARACT EXTRACTION Right 08/2012  . CHOLECYSTECTOMY    . COLONOSCOPY    . SKIN CANCER EXCISION    . TONSILLECTOMY AND ADENOIDECTOMY    . TUBAL LIGATION     Social History   Occupational History  . Occupation: housewife  Tobacco Use  . Smoking status: Current Every Day Smoker    Packs/day: 0.50    Years: 60.00    Pack years: 30.00    Types: Cigarettes  . Smokeless tobacco: Never Used  Substance and Sexual Activity  . Alcohol use: No  . Drug use: No  . Sexual activity: Not on file

## 2019-04-14 ENCOUNTER — Telehealth: Payer: Self-pay | Admitting: Orthopaedic Surgery

## 2019-04-14 NOTE — Telephone Encounter (Signed)
IC s/w patient she stated hydrocodone is not helping her pain. She said hydrocodone does not work for her. She has been taking them every 6 hours since Wednesday without relief. She also states she is unable to walk now and her husband has to drag her to bathroom. Unable to stand on her leg.

## 2019-04-14 NOTE — Telephone Encounter (Signed)
IC advised.  

## 2019-04-14 NOTE — Telephone Encounter (Signed)
Pt called in said they would like a call back from [facility] or dr.blackman said they didn't want to talk to me.   [phone]

## 2019-04-14 NOTE — Telephone Encounter (Signed)
There is really nothing else we can do until we have a MRI of her lumbar spine.

## 2019-04-14 NOTE — Telephone Encounter (Signed)
call

## 2019-04-26 ENCOUNTER — Ambulatory Visit (INDEPENDENT_AMBULATORY_CARE_PROVIDER_SITE_OTHER): Payer: Medicare Other | Admitting: Orthopaedic Surgery

## 2019-04-26 ENCOUNTER — Encounter: Payer: Self-pay | Admitting: Orthopaedic Surgery

## 2019-04-26 DIAGNOSIS — M4807 Spinal stenosis, lumbosacral region: Secondary | ICD-10-CM | POA: Diagnosis not present

## 2019-04-26 DIAGNOSIS — M79604 Pain in right leg: Secondary | ICD-10-CM

## 2019-04-26 DIAGNOSIS — M25551 Pain in right hip: Secondary | ICD-10-CM

## 2019-04-26 DIAGNOSIS — M1611 Unilateral primary osteoarthritis, right hip: Secondary | ICD-10-CM | POA: Diagnosis not present

## 2019-04-26 MED ORDER — GABAPENTIN 300 MG PO CAPS: 300.0000 mg | ORAL_CAPSULE | Freq: Two times a day (BID) | ORAL | 1 refills | Status: DC

## 2019-04-26 NOTE — Progress Notes (Signed)
Subjective: Patient is here for ultrasound-guided intra-articular right hip injection.   End-stage DJD.  Objective:  Very painful with passive IR, hard to bear weight.  Procedure: Ultrasound-guided right hip injection: After sterile prep with Betadine, injected 8 cc 1% lidocaine without epinephrine and 40 mg methylprednisolone using a 22-gauge spinal needle, passing the needle through the iliofemoral ligament into the femoral head/neck junction.  Injectate seen filling capsule.  Good immediate relief.

## 2019-04-26 NOTE — Progress Notes (Signed)
The patient comes in today with continued right lower extremity pain from her hip down to her foot with numbness and tingling.  She is a diabetic and she reports her recent blood glucose has been running about 1 40-1 60.  She has known osteoarthritis of her right hip.  She is scheduled for an MRI of her lumbar spine though in 2 weeks.  Due to her significant pain she cannot wait for the MRI.  On examination today she still has a positive straight leg raise on the right side but she has pain with rotation of her hip as well.  Rotates smoothly but everything is painful for her.  She said the hydrocodone provided not help at all.  I did start her on Neurontin just 100 mg only at bedtime.  I would like to have a consultation with Dr. Junius Roads today for an ultrasound-guided right hip intra-articular injection.  I am going to increase her Neurontin to 300 mg up to twice a day.  All question concerns were answered addressed.  After she has the injection by Dr. Junius Roads today we will then see her back after the MRI of her lumbar spine.

## 2019-05-10 ENCOUNTER — Other Ambulatory Visit: Payer: Self-pay | Admitting: Internal Medicine

## 2019-05-13 ENCOUNTER — Ambulatory Visit
Admission: RE | Admit: 2019-05-13 | Discharge: 2019-05-13 | Disposition: A | Discharge status: Home / Self Care | Payer: Medicare Other | Source: Ambulatory Visit | Attending: Orthopaedic Surgery | Admitting: Orthopaedic Surgery

## 2019-05-13 ENCOUNTER — Other Ambulatory Visit: Payer: Self-pay

## 2019-05-13 DIAGNOSIS — M48061 Spinal stenosis, lumbar region without neurogenic claudication: Secondary | ICD-10-CM | POA: Diagnosis not present

## 2019-05-13 DIAGNOSIS — M4807 Spinal stenosis, lumbosacral region: Secondary | ICD-10-CM

## 2019-05-13 DIAGNOSIS — M5117 Intervertebral disc disorders with radiculopathy, lumbosacral region: Secondary | ICD-10-CM | POA: Diagnosis not present

## 2019-05-16 ENCOUNTER — Ambulatory Visit (INDEPENDENT_AMBULATORY_CARE_PROVIDER_SITE_OTHER): Payer: Medicare Other | Admitting: Orthopaedic Surgery

## 2019-05-16 ENCOUNTER — Encounter: Payer: Self-pay | Admitting: Orthopaedic Surgery

## 2019-05-16 DIAGNOSIS — M4807 Spinal stenosis, lumbosacral region: Secondary | ICD-10-CM | POA: Diagnosis not present

## 2019-05-16 DIAGNOSIS — M79604 Pain in right leg: Secondary | ICD-10-CM

## 2019-05-16 MED ORDER — HYDROCODONE-ACETAMINOPHEN 5-325 MG PO TABS: 1.0000 | ORAL_TABLET | Freq: Four times a day (QID) | ORAL | 0 refills | Status: DC | PRN

## 2019-05-16 NOTE — Progress Notes (Signed)
The patient comes in today to go over an MRI of her lumbar spine we sent her for this MRI due to the radicular symptoms she is having from her hip going all the way to her foot.  She does have some arthritis in her right hip but her pain seem to be more related down her leg with radicular symptoms.  On exam she does have pain with rotation of her right hip but most of her pain seems to be going down her leg.  There is some subjective pain on the lateral aspect of her right leg as well as pain with a positive straight leg raise.   The MRI of her lumbar spine does show severe stenosis at L4-L5 and L5-S1 may be causing pressure on the right L5 nerve root due to an eccentric disc protrusion.  I do feel it is essential we sent her to Dr. Ernestina Patches for an epidural steroid injection to the right side of her lumbar spine to see if this will help alleviate her symptoms.  We will send in a small course of hydrocodone for her.  They can then to her back to follow-up with me at least 2 weeks or so after her injection.  All question concerns were answered and addressed.

## 2019-05-17 ENCOUNTER — Other Ambulatory Visit: Payer: Self-pay

## 2019-05-17 DIAGNOSIS — M4807 Spinal stenosis, lumbosacral region: Secondary | ICD-10-CM

## 2019-05-19 DIAGNOSIS — H353231 Exudative age-related macular degeneration, bilateral, with active choroidal neovascularization: Secondary | ICD-10-CM | POA: Diagnosis not present

## 2019-05-19 DIAGNOSIS — H43813 Vitreous degeneration, bilateral: Secondary | ICD-10-CM | POA: Diagnosis not present

## 2019-05-19 DIAGNOSIS — H35371 Puckering of macula, right eye: Secondary | ICD-10-CM | POA: Diagnosis not present

## 2019-05-19 DIAGNOSIS — H35423 Microcystoid degeneration of retina, bilateral: Secondary | ICD-10-CM | POA: Diagnosis not present

## 2019-05-31 ENCOUNTER — Other Ambulatory Visit: Payer: Self-pay | Admitting: Internal Medicine

## 2019-06-05 ENCOUNTER — Ambulatory Visit: Payer: Self-pay

## 2019-06-05 ENCOUNTER — Other Ambulatory Visit: Payer: Self-pay

## 2019-06-05 ENCOUNTER — Encounter: Payer: Self-pay | Admitting: Physical Medicine and Rehabilitation

## 2019-06-05 ENCOUNTER — Ambulatory Visit (INDEPENDENT_AMBULATORY_CARE_PROVIDER_SITE_OTHER): Payer: Medicare Other | Admitting: Physical Medicine and Rehabilitation

## 2019-06-05 VITALS — BP 167/70 | HR 62

## 2019-06-05 DIAGNOSIS — M5416 Radiculopathy, lumbar region: Secondary | ICD-10-CM

## 2019-06-05 MED ORDER — BETAMETHASONE SOD PHOS & ACET 6 (3-3) MG/ML IJ SUSP
12.0000 mg | Freq: Once | INTRAMUSCULAR | Status: AC
  Administered 2019-06-05: 12 mg

## 2019-06-05 NOTE — Progress Notes (Signed)
 .  Numeric Pain Rating Scale and Functional Assessment Average Pain 8   In the last MONTH (on 0-10 scale) has pain interfered with the following?  1. General activity like being  able to carry out your everyday physical activities such as walking, climbing stairs, carrying groceries, or moving a chair?  Rating(7)   +Driver, -BT, -Dye Allergies.  

## 2019-06-10 ENCOUNTER — Other Ambulatory Visit: Payer: Self-pay

## 2019-06-10 ENCOUNTER — Ambulatory Visit (INDEPENDENT_AMBULATORY_CARE_PROVIDER_SITE_OTHER): Payer: Medicare Other

## 2019-06-10 DIAGNOSIS — Z23 Encounter for immunization: Secondary | ICD-10-CM | POA: Diagnosis not present

## 2019-06-20 ENCOUNTER — Other Ambulatory Visit: Payer: Self-pay | Admitting: Orthopaedic Surgery

## 2019-06-20 ENCOUNTER — Other Ambulatory Visit: Payer: Self-pay | Admitting: Internal Medicine

## 2019-06-20 DIAGNOSIS — H353231 Exudative age-related macular degeneration, bilateral, with active choroidal neovascularization: Secondary | ICD-10-CM | POA: Diagnosis not present

## 2019-06-20 DIAGNOSIS — H35371 Puckering of macula, right eye: Secondary | ICD-10-CM | POA: Diagnosis not present

## 2019-06-20 DIAGNOSIS — H35423 Microcystoid degeneration of retina, bilateral: Secondary | ICD-10-CM | POA: Diagnosis not present

## 2019-06-20 DIAGNOSIS — H43813 Vitreous degeneration, bilateral: Secondary | ICD-10-CM | POA: Diagnosis not present

## 2019-06-20 NOTE — Telephone Encounter (Signed)
Please advise 

## 2019-07-03 ENCOUNTER — Telehealth: Payer: Self-pay | Admitting: Internal Medicine

## 2019-07-03 NOTE — Telephone Encounter (Signed)
°  Relation to pt: self  Call back number: [phone]  Pharmacy: [place], [place] - [place] [phone] (Phone) [phone] (Fax)     Reason for call:  Patient states rOPINIRole (REQUIP) 2 MG tablet  Is not working and would like an alternate , patient states restless legs at night have not improved , please advise

## 2019-07-03 NOTE — Telephone Encounter (Signed)
Patient states she has an appointment in January.  She can not afford to come sooner.  She will wait until January to discuss changing medication. Patient did state she took two of her current meds prescribed for restless leg last night and that did help.  She is requesting another script of this med to be sent to her pharmacy.

## 2019-07-03 NOTE — Telephone Encounter (Signed)
We cannot advise to take a medication outside of prescribed dosing.

## 2019-07-03 NOTE — Telephone Encounter (Signed)
Should have visit, can be virtual as not addressed or changed recently.

## 2019-07-03 NOTE — Telephone Encounter (Signed)
Can you make patient a visit to discuss please virtual or in office. Thank you

## 2019-07-03 NOTE — Telephone Encounter (Signed)
Patient still has one more refill at pharmacy to pick up will refill once that is picked up

## 2019-07-06 NOTE — Procedures (Signed)
Lumbosacral Transforaminal Epidural Steroid Injection - Sub-Pedicular Approach with Fluoroscopic Guidance  Patient: Tracy Hammond      Date of Birth: 02/02/1941 MRN: [id number] PCP: Hoyt Koch, MD      Visit Date: 06/05/2019   Universal Protocol:    Date/Time: 06/05/2019  Consent Given By: the patient  Position: PRONE  Additional Comments: Vital signs were monitored before and after the procedure. Patient was prepped and draped in the usual sterile fashion. The correct patient, procedure, and site was verified.   Injection Procedure Details:  Procedure Site One Meds Administered:  Meds ordered this encounter  Medications  . betamethasone acetate-betamethasone sodium phosphate (CELESTONE) injection 12 mg    Laterality: Right  Location/Site:  L4-L5  Needle size: 22 G  Needle type: Spinal  Needle Placement: Transforaminal  Findings:    -Comments: Excellent flow of contrast along the nerve and into the epidural space.  Procedure Details: After squaring off the end-plates to get a true AP view, the C-arm was positioned so that an oblique view of the foramen as noted above was visualized. The target area is just inferior to the "nose of the scotty dog" or sub pedicular. The soft tissues overlying this structure were infiltrated with 2-3 ml. of 1% Lidocaine without Epinephrine.  The spinal needle was inserted toward the target using a "trajectory" view along the fluoroscope beam.  Under AP and lateral visualization, the needle was advanced so it did not puncture dura and was located close the 6 O'Clock position of the pedical in AP tracterory. Biplanar projections were used to confirm position. Aspiration was confirmed to be negative for CSF and/or blood. A 1-2 ml. volume of Isovue-250 was injected and flow of contrast was noted at each level. Radiographs were obtained for documentation purposes.   After attaining the desired flow of contrast documented above, a  0.5 to 1.0 ml test dose of 0.25% Marcaine was injected into each respective transforaminal space.  The patient was observed for 90 seconds post injection.  After no sensory deficits were reported, and normal lower extremity motor function was noted,   the above injectate was administered so that equal amounts of the injectate were placed at each foramen (level) into the transforaminal epidural space.   Additional Comments:  The patient tolerated the procedure well Dressing: 2 x 2 sterile gauze and Band-Aid    Post-procedure details: Patient was observed during the procedure. Post-procedure instructions were reviewed.  Patient left the clinic in stable condition.

## 2019-07-06 NOTE — Progress Notes (Signed)
Tracy Hammond - 78 y.o. female MRN [id number]  Date of birth: 1940-10-11  Office Visit Note: Visit Date: 06/05/2019 PCP: Hoyt Koch, MD Referred by: Hoyt Koch, *  Subjective: Chief Complaint  Patient presents with  . Lower Back - Pain  . Right Leg - Pain   HPI:  Tracy Hammond is a 78 y.o. female who comes in today At the request of Dr. Jean Rosenthal for right L4 transforaminal epidural steroid injection.  Patient's been having right hip and leg pain for over 6 months.  Have seen in the past for intra-articular hip injection through Dr. Ninfa Linden as well.  MRI obtained shows lateral disc protrusion and foraminal narrowing on the right at L4 really only mild central stenosis.  There is some lateral recess stenosis on the left but no left-sided complaints.  Her back pain is likely facet joint mediated back pain.  She likely has 2 issues.  If the hip and leg pain are better and that is what is bothering her the most than the epidural injection hopefully will help her.  If she continues to have back pain but the leg pain is better would consider medial branch blocks.  ROS Otherwise per HPI.  Assessment & Plan: Visit Diagnoses:  1. Lumbar radiculopathy     Plan: No additional findings.   Meds & Orders:  Meds ordered this encounter  Medications  . betamethasone acetate-betamethasone sodium phosphate (CELESTONE) injection 12 mg    Orders Placed This Encounter  Procedures  . XR C-ARM NO REPORT  . Epidural Steroid injection    Follow-up: Return if symptoms worsen or fail to improve.   Procedures: No procedures performed  Lumbosacral Transforaminal Epidural Steroid Injection - Sub-Pedicular Approach with Fluoroscopic Guidance  Patient: Tracy Hammond      Date of Birth: [birth year] MRN: [id number] PCP: Hoyt Koch, MD      Visit Date: 06/05/2019   Universal Protocol:    Date/Time: 06/05/2019  Consent Given By: the patient  Position: PRONE   Additional Comments: Vital signs were monitored before and after the procedure. Patient was prepped and draped in the usual sterile fashion. The correct patient, procedure, and site was verified.   Injection Procedure Details:  Procedure Site One Meds Administered:  Meds ordered this encounter  Medications  . betamethasone acetate-betamethasone sodium phosphate (CELESTONE) injection 12 mg    Laterality: Right  Location/Site:  L4-L5  Needle size: 22 G  Needle type: Spinal  Needle Placement: Transforaminal  Findings:    -Comments: Excellent flow of contrast along the nerve and into the epidural space.  Procedure Details: After squaring off the end-plates to get a true AP view, the C-arm was positioned so that an oblique view of the foramen as noted above was visualized. The target area is just inferior to the "nose of the scotty dog" or sub pedicular. The soft tissues overlying this structure were infiltrated with 2-3 ml. of 1% Lidocaine without Epinephrine.  The spinal needle was inserted toward the target using a "trajectory" view along the fluoroscope beam.  Under AP and lateral visualization, the needle was advanced so it did not puncture dura and was located close the 6 O'Clock position of the pedical in AP tracterory. Biplanar projections were used to confirm position. Aspiration was confirmed to be negative for CSF and/or blood. A 1-2 ml. volume of Isovue-250 was injected and flow of contrast was noted at each level. Radiographs were obtained for documentation purposes.  After attaining the desired flow of contrast documented above, a 0.5 to 1.0 ml test dose of 0.25% Marcaine was injected into each respective transforaminal space.  The patient was observed for 90 seconds post injection.  After no sensory deficits were reported, and normal lower extremity motor function was noted,   the above injectate was administered so that equal amounts of the injectate were placed at  each foramen (level) into the transforaminal epidural space.   Additional Comments:  The patient tolerated the procedure well Dressing: 2 x 2 sterile gauze and Band-Aid    Post-procedure details: Patient was observed during the procedure. Post-procedure instructions were reviewed.  Patient left the clinic in stable condition.    Clinical History: MRI LUMBAR SPINE WITHOUT CONTRAST  TECHNIQUE: Multiplanar, multisequence MR imaging of the lumbar spine was performed. No intravenous contrast was administered.  COMPARISON:  None.  FINDINGS: Segmentation:  Standard.  Alignment:  Physiologic.  Vertebrae:  No fracture, evidence of discitis, or bone lesion.  Conus medullaris and cauda equina: Conus extends to the L1 level. Conus and cauda equina appear normal.  Paraspinal and other soft tissues: Negative.  Disc levels:  T10-11 and T11-12 are imaged only in the sagittal plane but are normal.  T12-L1: Normal disc space and facets. No spinal canal or neuroforaminal stenosis.  L1-L2: Small central disc protrusion without spinal canal or neural foraminal stenosis. Normal facets.  L2-L3: Left eccentric disc bulge with mild narrowing of the left lateral recess. Normal facets.  L3-L4: Intermediate diffuse disc bulge with narrowing of both lateral recesses and mild central spinal canal stenosis. Mild facet hypertrophy. Mild bilateral foraminal stenosis.  L4-L5: Diffuse left eccentric disc bulge with moderate facet hypertrophy. There is mild central spinal canal stenosis and marked left lateral recess stenosis. There is moderate right and moderate left neural foraminal stenosis. Extraforaminal component of the disc bulge is in close proximity to the exiting right L4 nerve root.  L5-S1: Intermediate disc bulge. Mild right and severe left neural foraminal stenosis. Narrowing of the left lateral recess with displacement of the descending left S1 nerve root.   Visualized sacrum: Normal.  IMPRESSION: 1. Moderate bilateral L4 neural foraminal stenosis with extraforaminal disc component in close proximity to the exiting right L4 nerve root. Moderate left lateral recess stenosis and mild spinal canal stenosis also at this level. 2. L5-S1 severe left neural foraminal stenosis and lateral recess stenosis that could contribute to radiculopathy in the left L5 or S1 distributions. 3.   Electronically Signed   By: Ulyses Jarred M.D.   On: 05/14/2019 00:15     Objective:  VS:  HT:    WT:   BMI:     BP:(!) 167/70  HR:62bpm  TEMP: ( )  RESP:  Physical Exam  Ortho Exam Imaging: No results found.

## 2019-08-01 DIAGNOSIS — H43813 Vitreous degeneration, bilateral: Secondary | ICD-10-CM | POA: Diagnosis not present

## 2019-08-01 DIAGNOSIS — H353231 Exudative age-related macular degeneration, bilateral, with active choroidal neovascularization: Secondary | ICD-10-CM | POA: Diagnosis not present

## 2019-08-01 DIAGNOSIS — H33191 Other retinoschisis and retinal cysts, right eye: Secondary | ICD-10-CM | POA: Diagnosis not present

## 2019-08-01 DIAGNOSIS — H35371 Puckering of macula, right eye: Secondary | ICD-10-CM | POA: Diagnosis not present

## 2019-08-14 DIAGNOSIS — H401123 Primary open-angle glaucoma, left eye, severe stage: Secondary | ICD-10-CM | POA: Diagnosis not present

## 2019-08-14 DIAGNOSIS — H401114 Primary open-angle glaucoma, right eye, indeterminate stage: Secondary | ICD-10-CM | POA: Diagnosis not present

## 2019-08-29 ENCOUNTER — Other Ambulatory Visit: Payer: Self-pay | Admitting: Internal Medicine

## 2019-09-07 DIAGNOSIS — Z01419 Encounter for gynecological examination (general) (routine) without abnormal findings: Secondary | ICD-10-CM | POA: Diagnosis not present

## 2019-09-07 DIAGNOSIS — Z1231 Encounter for screening mammogram for malignant neoplasm of breast: Secondary | ICD-10-CM | POA: Diagnosis not present

## 2019-09-12 DIAGNOSIS — H43813 Vitreous degeneration, bilateral: Secondary | ICD-10-CM | POA: Diagnosis not present

## 2019-09-12 DIAGNOSIS — H353231 Exudative age-related macular degeneration, bilateral, with active choroidal neovascularization: Secondary | ICD-10-CM | POA: Diagnosis not present

## 2019-09-12 DIAGNOSIS — H35423 Microcystoid degeneration of retina, bilateral: Secondary | ICD-10-CM | POA: Diagnosis not present

## 2019-09-12 DIAGNOSIS — H33191 Other retinoschisis and retinal cysts, right eye: Secondary | ICD-10-CM | POA: Diagnosis not present

## 2019-09-19 ENCOUNTER — Encounter: Payer: Medicare Other | Admitting: Internal Medicine

## 2019-09-20 ENCOUNTER — Ambulatory Visit (INDEPENDENT_AMBULATORY_CARE_PROVIDER_SITE_OTHER): Payer: Medicare Other | Admitting: Internal Medicine

## 2019-09-20 ENCOUNTER — Encounter: Payer: Self-pay | Admitting: Internal Medicine

## 2019-09-20 ENCOUNTER — Other Ambulatory Visit: Payer: Self-pay

## 2019-09-20 VITALS — BP 142/88 | HR 68 | Temp 98.1°F | Ht 61.5 in | Wt 152.0 lb

## 2019-09-20 DIAGNOSIS — E118 Type 2 diabetes mellitus with unspecified complications: Secondary | ICD-10-CM | POA: Diagnosis not present

## 2019-09-20 DIAGNOSIS — I10 Essential (primary) hypertension: Secondary | ICD-10-CM

## 2019-09-20 DIAGNOSIS — R197 Diarrhea, unspecified: Secondary | ICD-10-CM | POA: Diagnosis not present

## 2019-09-20 DIAGNOSIS — Z0000 Encounter for general adult medical examination without abnormal findings: Secondary | ICD-10-CM | POA: Diagnosis not present

## 2019-09-20 DIAGNOSIS — J410 Simple chronic bronchitis: Secondary | ICD-10-CM

## 2019-09-20 DIAGNOSIS — E1169 Type 2 diabetes mellitus with other specified complication: Secondary | ICD-10-CM

## 2019-09-20 DIAGNOSIS — E785 Hyperlipidemia, unspecified: Secondary | ICD-10-CM

## 2019-09-20 LAB — COMPREHENSIVE METABOLIC PANEL
ALT: 49 U/L — ABNORMAL HIGH (ref 0–35)
AST: 60 U/L — ABNORMAL HIGH (ref 0–37)
Albumin: 4.2 g/dL (ref 3.5–5.2)
Alkaline Phosphatase: 102 U/L (ref 39–117)
BUN: 21 mg/dL (ref 6–23)
CO2: 28 mEq/L (ref 19–32)
Calcium: 10.2 mg/dL (ref 8.4–10.5)
Chloride: 102 mEq/L (ref 96–112)
Creatinine, Ser: 0.61 mg/dL (ref 0.40–1.20)
GFR: 95.26 mL/min (ref >60.00)
Glucose, Bld: 220 mg/dL — ABNORMAL HIGH (ref 70–99)
Potassium: 4 mEq/L (ref 3.5–5.1)
Sodium: 138 mEq/L (ref 135–145)
Total Bilirubin: 0.5 mg/dL (ref 0.2–1.2)
Total Protein: 6.9 g/dL (ref 6.0–8.3)

## 2019-09-20 LAB — CBC
HCT: 43.6 % (ref 36.0–46.0)
Hemoglobin: 14.5 g/dL (ref 12.0–15.0)
MCHC: 33.4 g/dL (ref 30.0–36.0)
MCV: 96.5 fl (ref 78.0–100.0)
Platelets: 231 10*3/uL (ref 150.0–400.0)
RBC: 4.52 Mil/uL (ref 3.87–5.11)
RDW: 12.9 % (ref 11.5–15.5)
WBC: 9.9 10*3/uL (ref 4.0–10.5)

## 2019-09-20 LAB — HEMOGLOBIN A1C: Hgb A1c MFr Bld: 8.8 % — ABNORMAL HIGH (ref 4.6–6.5)

## 2019-09-20 LAB — LIPID PANEL
Cholesterol: 135 mg/dL (ref 0–200)
HDL: 40 mg/dL (ref >39.00)
LDL Cholesterol: 68 mg/dL (ref 0–99)
NonHDL: 95.33
Total CHOL/HDL Ratio: 3
Triglycerides: 139 mg/dL (ref 0.0–149.0)
VLDL: 27.8 mg/dL (ref 0.0–40.0)

## 2019-09-20 LAB — MICROALBUMIN / CREATININE URINE RATIO
Creatinine,U: 90.8 mg/dL
Microalb Creat Ratio: 48.7 mg/g — ABNORMAL HIGH (ref 0.0–30.0)
Microalb, Ur: 44.2 mg/dL — ABNORMAL HIGH (ref 0.0–1.9)

## 2019-09-20 MED ORDER — DIPHENOXYLATE-ATROPINE 2.5-0.025 MG PO TABS: 1.0000 | ORAL_TABLET | Freq: Four times a day (QID) | ORAL | 0 refills | Status: DC | PRN

## 2019-09-20 NOTE — Assessment & Plan Note (Signed)
Checking lipid panel. Prior muscle cramps with statin and previously has not wanted to re-try.

## 2019-09-20 NOTE — Assessment & Plan Note (Signed)
Rx lomotil to see if this helps. Has tried otc options without much relief.

## 2019-09-20 NOTE — Patient Instructions (Signed)
Health Maintenance, Female Adopting a healthy lifestyle and getting preventive care are important in promoting health and wellness. Ask your health care provider about:  The right schedule for you to have regular tests and exams.  Things you can do on your own to prevent diseases and keep yourself healthy. What should I know about diet, weight, and exercise? Eat a healthy diet   Eat a diet that includes plenty of vegetables, fruits, low-fat dairy products, and lean protein.  Do not eat a lot of foods that are high in solid fats, added sugars, or sodium. Maintain a healthy weight Body mass index (BMI) is used to identify weight problems. It estimates body fat based on height and weight. Your health care provider can help determine your BMI and help you achieve or maintain a healthy weight. Get regular exercise Get regular exercise. This is one of the most important things you can do for your health. Most adults should:  Exercise for at least 150 minutes each week. The exercise should increase your heart rate and make you sweat (moderate-intensity exercise).  Do strengthening exercises at least twice a week. This is in addition to the moderate-intensity exercise.  Spend less time sitting. Even light physical activity can be beneficial. Watch cholesterol and blood lipids Have your blood tested for lipids and cholesterol at 77 years of age, then have this test every 5 years. Have your cholesterol levels checked more often if:  Your lipid or cholesterol levels are high.  You are older than 77 years of age.  You are at high risk for heart disease. What should I know about cancer screening? Depending on your health history and family history, you may need to have cancer screening at various ages. This may include screening for:  Breast cancer.  Cervical cancer.  Colorectal cancer.  Skin cancer.  Lung cancer. What should I know about heart disease, diabetes, and high blood  pressure? Blood pressure and heart disease  High blood pressure causes heart disease and increases the risk of stroke. This is more likely to develop in people who have high blood pressure readings, are of African descent, or are overweight.  Have your blood pressure checked: ? Every 3-5 years if you are 18-39 years of age. ? Every year if you are 40 years old or older. Diabetes Have regular diabetes screenings. This checks your fasting blood sugar level. Have the screening done:  Once every three years after age 40 if you are at a normal weight and have a low risk for diabetes.  More often and at a younger age if you are overweight or have a high risk for diabetes. What should I know about preventing infection? Hepatitis B If you have a higher risk for hepatitis B, you should be screened for this virus. Talk with your health care provider to find out if you are at risk for hepatitis B infection. Hepatitis C Testing is recommended for:  Everyone born from 1945 through 1965.  Anyone with known risk factors for hepatitis C. Sexually transmitted infections (STIs)  Get screened for STIs, including gonorrhea and chlamydia, if: ? You are sexually active and are younger than 77 years of age. ? You are older than 77 years of age and your health care provider tells you that you are at risk for this type of infection. ? Your sexual activity has changed since you were last screened, and you are at increased risk for chlamydia or gonorrhea. Ask your health care provider if   you are at risk.  Ask your health care provider about whether you are at high risk for HIV. Your health care provider may recommend a prescription medicine to help prevent HIV infection. If you choose to take medicine to prevent HIV, you should first get tested for HIV. You should then be tested every 3 months for as long as you are taking the medicine. Pregnancy  If you are about to stop having your period (premenopausal) and  you may become pregnant, seek counseling before you get pregnant.  Take 400 to 800 micrograms (mcg) of folic acid every day if you become pregnant.  Ask for birth control (contraception) if you want to prevent pregnancy. Osteoporosis and menopause Osteoporosis is a disease in which the bones lose minerals and strength with aging. This can result in bone fractures. If you are 65 years old or older, or if you are at risk for osteoporosis and fractures, ask your health care provider if you should:  Be screened for bone loss.  Take a calcium or vitamin D supplement to lower your risk of fractures.  Be given hormone replacement therapy (HRT) to treat symptoms of menopause. Follow these instructions at home: Lifestyle  Do not use any products that contain nicotine or tobacco, such as cigarettes, e-cigarettes, and chewing tobacco. If you need help quitting, ask your health care provider.  Do not use street drugs.  Do not share needles.  Ask your health care provider for help if you need support or information about quitting drugs. Alcohol use  Do not drink alcohol if: ? Your health care provider tells you not to drink. ? You are pregnant, may be pregnant, or are planning to become pregnant.  If you drink alcohol: ? Limit how much you use to 0-1 drink a day. ? Limit intake if you are breastfeeding.  Be aware of how much alcohol is in your drink. In the [place] one drink equals one 12 oz bottle of beer (355 mL), one 5 oz glass of wine (148 mL), or one 1 oz glass of hard liquor (44 mL). General instructions  Schedule regular health, dental, and eye exams.  Stay current with your vaccines.  Tell your health care provider if: ? You often feel depressed. ? You have ever been abused or do not feel safe at home. Summary  Adopting a healthy lifestyle and getting preventive care are important in promoting health and wellness.  Follow your health care provider's instructions about healthy  diet, exercising, and getting tested or screened for diseases.  Follow your health care provider's instructions on monitoring your cholesterol and blood pressure. This information is not intended to replace advice given to you by your health care provider. Make sure you discuss any questions you have with your health care provider. Document Revised: 08/10/2018 Document Reviewed: 08/10/2018 Elsevier Patient Education  2020 Elsevier Inc.  

## 2019-09-20 NOTE — Assessment & Plan Note (Signed)
Counseled to quit smoking but she does not feel able to make an attempt at this time.

## 2019-09-20 NOTE — Assessment & Plan Note (Signed)
BP at goal on lisinopril/hctz. Checking CMP and adjust as needed.  

## 2019-09-20 NOTE — Assessment & Plan Note (Signed)
Checking HgA1c, foot exam done. Checking microalbumin to creatinine ratio. Adjust glipizide and metformin as needed. Taking ACE-I. Adjust as needed.

## 2019-09-20 NOTE — Assessment & Plan Note (Signed)
Flu shot up to date. Pneumonia complete. Shingrix counseled. Tetanus declines due to cost. Colonoscopy due 2021. Mammogram aged out, pap smear aged out and dexa due 2022. Counseled about sun safety and mole surveillance. Counseled about the dangers of distracted driving. Given 10 year screening recommendations.

## 2019-09-20 NOTE — Progress Notes (Signed)
Subjective:   Patient ID: Tracy Hammond, female    DOB: Nov 12, 1940, 79 y.o.   MRN: [id number]  HPI Here for medicare wellness and physical, no new complaints. Please see A/P for status and treatment of chronic medical problems.   Diet: DM since diabetic Physical activity: sedentary Depression/mood screen: negative Hearing: intact to whispered voice Visual acuity: poor, performs annual eye exam  ADLs: capable Fall risk: medium due to eyesight problems Home safety: good Cognitive evaluation: intact to orientation, naming, recall and repetition EOL planning: adv directives discussed    Office Visit from 09/20/2019 in [place] at [facility] Total Score  0       I have personally reviewed and have noted 1. The patient's medical and social history - reviewed today no changes 2. Their use of alcohol, tobacco or illicit drugs 3. Their current medications and supplements 4. The patient's functional ability including ADL's, fall risks, home safety risks and hearing or visual impairment. 5. Diet and physical activities 6. Evidence for depression or mood disorders 7. Care team reviewed and updated  Patient Care Team: Hoyt Koch, MD as PCP - General (Internal Medicine) Buford Dresser, MD as PCP - Cardiology (Cardiology) Past Medical History:  Diagnosis Date  . Allergy    seasonal  . Arthritis    fingers  . Bursitis   . Cancer ([place])    skin cancer arm and face  . Diabetes mellitus   . High cholesterol   . Hypertension   . Personal history of colonic adenoma 06/12/2008  . Thyroid disease    young adult-no meds now   Past Surgical History:  Procedure Laterality Date  . Bladder Tact    . CATARACT EXTRACTION  09-07-2012   rt eye  . CATARACT EXTRACTION Right 08/2012  . CHOLECYSTECTOMY    . COLONOSCOPY    . SKIN CANCER EXCISION    . TONSILLECTOMY AND ADENOIDECTOMY    . TUBAL LIGATION     Family History  Problem Relation Age of Onset  .  Hypertension Father        family hx  . Diabetes Mother   . Kidney failure Mother   . Heart disease Mother   . Stroke Sister   . Other Sister        Leg Disease  . Heart disease Son   . Colon cancer Neg Hx   . Esophageal cancer Neg Hx   . Rectal cancer Neg Hx   . Stomach cancer Neg Hx     Review of Systems  Constitutional: Positive for activity change.  HENT: Negative.   Eyes: Positive for visual disturbance.  Respiratory: Negative for cough, chest tightness and shortness of breath.   Cardiovascular: Negative for chest pain, palpitations and leg swelling.  Gastrointestinal: Negative for abdominal distention, abdominal pain, constipation, diarrhea, nausea and vomiting.  Musculoskeletal: Positive for arthralgias and myalgias.  Skin: Negative.   Neurological: Negative.   Psychiatric/Behavioral: The patient is nervous/anxious.     Objective:  Physical Exam Constitutional:      Appearance: She is well-developed.  HENT:     Head: Normocephalic and atraumatic.  Cardiovascular:     Rate and Rhythm: Normal rate and regular rhythm.  Pulmonary:     Effort: Pulmonary effort is normal. No respiratory distress.     Breath sounds: Wheezing present. No rales.     Comments: Chronic stable Abdominal:     General: Bowel sounds are normal. There is no distension.  Palpations: Abdomen is soft.     Tenderness: There is no abdominal tenderness. There is no rebound.  Musculoskeletal:     Cervical back: Normal range of motion.  Skin:    General: Skin is warm and dry.     Comments: Foot exam done  Neurological:     Mental Status: She is alert and oriented to person, place, and time.     Coordination: Coordination normal.     Vitals:   09/20/19 0814  BP: (!) 142/88  Pulse: 68  Temp: 98.1 F (36.7 C)  TempSrc: Oral  SpO2: 96%  Weight: 152 lb (68.9 kg)  Height: 5' 1.5" (1.562 m)    This visit occurred during the SARS-CoV-2 public health emergency.  Safety protocols were in  place, including screening questions prior to the visit, additional usage of staff PPE, and extensive cleaning of exam room while observing appropriate contact time as indicated for disinfecting solutions.   Assessment & Plan:

## 2019-09-21 ENCOUNTER — Telehealth: Payer: Self-pay

## 2019-09-21 NOTE — Telephone Encounter (Signed)
New message    The patient was seen on yesterday . .  prescription for restless leg    1. Which medications need to be refilled? (please list name of each medication and dose if known)rOPINIRole (REQUIP) 2 MG tablet ---  Patient is asking for a stronger MG.  2. Which pharmacy/location (including street and city if local pharmacy) is medication to be sent to? [facility] in [place] on [address].

## 2019-09-25 MED ORDER — ROPINIROLE HCL 2 MG PO TABS: 2.0000 mg | ORAL_TABLET | Freq: Every day | ORAL | 1 refills | Status: DC

## 2019-09-25 NOTE — Telephone Encounter (Signed)
Can be refilled but dosage should not be increased.

## 2019-09-29 ENCOUNTER — Other Ambulatory Visit: Payer: Self-pay | Admitting: Internal Medicine

## 2019-09-29 MED ORDER — METFORMIN HCL 1000 MG PO TABS: 1000.0000 mg | ORAL_TABLET | Freq: Two times a day (BID) | ORAL | 3 refills | Status: DC

## 2019-10-16 DIAGNOSIS — H401123 Primary open-angle glaucoma, left eye, severe stage: Secondary | ICD-10-CM | POA: Diagnosis not present

## 2019-10-16 DIAGNOSIS — H401114 Primary open-angle glaucoma, right eye, indeterminate stage: Secondary | ICD-10-CM | POA: Diagnosis not present

## 2019-10-30 ENCOUNTER — Ambulatory Visit: Payer: Medicare Other | Attending: Internal Medicine

## 2019-10-30 DIAGNOSIS — Z23 Encounter for immunization: Secondary | ICD-10-CM | POA: Insufficient documentation

## 2019-10-30 NOTE — Progress Notes (Signed)
   Covid-19 Vaccination Clinic  Name:  Tracy Hammond    MRN: [id number] DOB: [birth year]  10/30/2019  [name] was observed post Covid-19 immunization for 15 minutes without incidence. She was provided with Vaccine Information Sheet and instruction to access the V-Safe system.   [name] was instructed to call 911 with any severe reactions post vaccine: Marland Kitchen Difficulty breathing  . Swelling of your face and throat  . A fast heartbeat  . A bad rash all over your body  . Dizziness and weakness    Immunizations Administered    Name Date Dose VIS Date Route   Pfizer COVID-19 Vaccine 10/30/2019  8:39 AM 0.3 mL 08/11/2019 Intramuscular   Manufacturer: [place]   Lot: [id number]   [place]: [id number]

## 2019-11-07 DIAGNOSIS — H33191 Other retinoschisis and retinal cysts, right eye: Secondary | ICD-10-CM | POA: Diagnosis not present

## 2019-11-07 DIAGNOSIS — H35371 Puckering of macula, right eye: Secondary | ICD-10-CM | POA: Diagnosis not present

## 2019-11-07 DIAGNOSIS — H353231 Exudative age-related macular degeneration, bilateral, with active choroidal neovascularization: Secondary | ICD-10-CM | POA: Diagnosis not present

## 2019-11-07 DIAGNOSIS — H35423 Microcystoid degeneration of retina, bilateral: Secondary | ICD-10-CM | POA: Diagnosis not present

## 2019-11-27 ENCOUNTER — Other Ambulatory Visit: Payer: Self-pay | Admitting: Internal Medicine

## 2019-11-28 ENCOUNTER — Ambulatory Visit: Payer: Medicare Other | Attending: Internal Medicine

## 2019-11-28 DIAGNOSIS — Z23 Encounter for immunization: Secondary | ICD-10-CM

## 2019-11-28 NOTE — Progress Notes (Signed)
   Covid-19 Vaccination Clinic  Name:  Tracy Hammond    MRN: [id number] DOB: 1941/07/18  11/28/2019  Ms. Coil was observed post Covid-19 immunization for 15 minutes without incident. She was provided with Vaccine Information Sheet and instruction to access the V-Safe system.   [name] was instructed to call 911 with any severe reactions post vaccine: Marland Kitchen Difficulty breathing  . Swelling of face and throat  . A fast heartbeat  . A bad rash all over body  . Dizziness and weakness   Immunizations Administered    Name Date Dose VIS Date Route   Pfizer COVID-19 Vaccine 11/28/2019  9:14 AM 0.3 mL 08/11/2019 Intramuscular   Manufacturer: [place]   Lot: [id number]   [place]: [id number]

## 2019-12-04 ENCOUNTER — Encounter: Payer: Self-pay | Admitting: Orthopaedic Surgery

## 2019-12-04 ENCOUNTER — Other Ambulatory Visit: Payer: Self-pay

## 2019-12-04 ENCOUNTER — Ambulatory Visit (INDEPENDENT_AMBULATORY_CARE_PROVIDER_SITE_OTHER): Payer: Medicare Other | Admitting: Orthopaedic Surgery

## 2019-12-04 DIAGNOSIS — M5442 Lumbago with sciatica, left side: Secondary | ICD-10-CM

## 2019-12-04 DIAGNOSIS — M4807 Spinal stenosis, lumbosacral region: Secondary | ICD-10-CM

## 2019-12-04 MED ORDER — HYDROCODONE-ACETAMINOPHEN 5-325 MG PO TABS: 1.0000 | ORAL_TABLET | Freq: Four times a day (QID) | ORAL | 0 refills | Status: DC | PRN

## 2019-12-04 NOTE — Progress Notes (Signed)
Office Visit Note   Patient: Tracy Hammond           Date of Birth: 25-Feb-1941           MRN: [id number] Visit Date: 12/04/2019              Requested by: Hoyt Koch, MD [address] [place],  [place] [address] PCP: Hoyt Koch, MD   Assessment & Plan: Visit Diagnoses:  1. Pain in right leg   2. Spinal stenosis of lumbosacral region     Plan: Based on her clinical exam findings and previous MRI findings, I do feel that she would benefit from another intervention by Dr. Ernestina Patches at this time to the left side of the L4 nerve area/level.  She agrees with having this intervention or least an appointment set up with Dr. Ernestina Patches given the success that she had from the right side.  All question concerns were answered and addressed.  I will send in a short course of Norco to help with her acute pain.  Follow-Up Instructions: No follow-ups on file.   Orders:  No orders of the defined types were placed in this encounter.  No orders of the defined types were placed in this encounter.     Procedures: No procedures performed   Clinical Data: No additional findings.   Subjective: Chief Complaint  Patient presents with  . Left Leg - Pain  The patient is someone we have not seen in several months.  Before it was due to significant right-sided sciatica.  A MRI 2 months ago showed severe bilateral foraminal stenosis at L4-L5.  There was an eccentric disc bulge/herniation more to the right side.  We sent her to Dr. Ernestina Patches and he did perform injections which helped her significantly.  She says that pain in the right side is resolved.  For the last several days to a week or so she has experienced the same symptoms now on her left side.  She reports numbness and tingling going down the left side of her leg.  It does wake her up at night.  It is hurting with activities.  She denies any knee pain or groin pain.  She does report left leg weakness.  She is a diabetic and her  daily sugars run between 120-140.  HPI  Review of Systems She currently denies any headache, chest pain, shortness of breath, fever, chills, nausea, vomiting  Objective: Vital Signs: There were no vitals taken for this visit.  Physical Exam She is alert and orient x3 and in no acute distress Ortho Exam Examination of her bilateral extremity shows a positive straight leg raise to the left side but not to the right side.  She has subjective numbness on the lateral aspect of her left leg.  Her hip and knee exam and foot and ankle exam are otherwise normal other than subjective numbness in both her feet which is likely from chronic neuropathy. Specialty Comments:  No specialty comments available.  Imaging: No results found.   PMFS History: Patient Active Problem List   Diagnosis Date Noted  . Unilateral primary osteoarthritis, right hip 12/29/2017  . Rash 10/29/2017  . Smokers' cough ([place]) 11/13/2016  . Routine general medical examination at a health care facility 09/05/2015  . Diarrhea 03/15/2015  . Restless leg syndrome 08/28/2014  . VARICOSE VEINS, LOWER EXTREMITIES 05/14/2008  . Hyperlipidemia associated with type 2 diabetes mellitus ([place]) 04/14/2007  . Diabetes mellitus type 2 with complications ([place])  03/29/2007  . Essential hypertension 03/29/2007   Past Medical History:  Diagnosis Date  . Allergy    seasonal  . Arthritis    fingers  . Bursitis   . Cancer ([place])    skin cancer arm and face  . Diabetes mellitus   . High cholesterol   . Hypertension   . Personal history of colonic adenoma 06/12/2008  . Thyroid disease    young adult-no meds now    Family History  Problem Relation Age of Onset  . Hypertension Father        family hx  . Diabetes Mother   . Kidney failure Mother   . Heart disease Mother   . Stroke Sister   . Other Sister        Leg Disease  . Heart disease Son   . Colon cancer Neg Hx   . Esophageal cancer Neg Hx   . Rectal cancer Neg Hx     . Stomach cancer Neg Hx     Past Surgical History:  Procedure Laterality Date  . Bladder Tact    . CATARACT EXTRACTION  09-07-2012   rt eye  . CATARACT EXTRACTION Right 08/2012  . CHOLECYSTECTOMY    . COLONOSCOPY    . SKIN CANCER EXCISION    . TONSILLECTOMY AND ADENOIDECTOMY    . TUBAL LIGATION     Social History   Occupational History  . Occupation: housewife  Tobacco Use  . Smoking status: Current Every Day Smoker    Packs/day: 0.50    Years: 60.00    Pack years: 30.00    Types: Cigarettes  . Smokeless tobacco: Never Used  Substance and Sexual Activity  . Alcohol use: No  . Drug use: No  . Sexual activity: Not on file

## 2019-12-18 ENCOUNTER — Telehealth: Payer: Self-pay | Admitting: Orthopaedic Surgery

## 2019-12-18 ENCOUNTER — Other Ambulatory Visit: Payer: Self-pay | Admitting: Orthopaedic Surgery

## 2019-12-18 ENCOUNTER — Encounter: Payer: Self-pay | Admitting: Internal Medicine

## 2019-12-18 MED ORDER — HYDROCODONE-ACETAMINOPHEN 5-325 MG PO TABS: 1.0000 | ORAL_TABLET | Freq: Four times a day (QID) | ORAL | 0 refills | Status: DC | PRN

## 2019-12-18 NOTE — Telephone Encounter (Signed)
Patient asking for a refill of the hydrocodone

## 2019-12-18 NOTE — Telephone Encounter (Signed)
I did send in some more hydrocodone.  She does need to try to use this sparingly because this is not a medication we like to prescribe over and over again.

## 2019-12-18 NOTE — Telephone Encounter (Signed)
Patient called.  She did not disclose why but she is requesting a call back from Bowman or his assistant.   Call back: [phone]

## 2020-01-01 ENCOUNTER — Ambulatory Visit (INDEPENDENT_AMBULATORY_CARE_PROVIDER_SITE_OTHER): Payer: Medicare Other | Admitting: Physical Medicine and Rehabilitation

## 2020-01-01 ENCOUNTER — Ambulatory Visit: Payer: Self-pay

## 2020-01-01 ENCOUNTER — Other Ambulatory Visit: Payer: Self-pay

## 2020-01-01 VITALS — BP 155/68 | HR 70

## 2020-01-01 DIAGNOSIS — M5416 Radiculopathy, lumbar region: Secondary | ICD-10-CM

## 2020-01-01 MED ORDER — METHYLPREDNISOLONE ACETATE 80 MG/ML IJ SUSP
40.0000 mg | Freq: Once | INTRAMUSCULAR | Status: AC
  Administered 2020-01-01: 40 mg

## 2020-01-01 NOTE — Progress Notes (Signed)
Pt states pain in the left leg. Pt states she feels like something is crawling up the left calf from time to time. Pt states hydrocodone helps with pain some. Pt states pain is worse with walking.   Numeric Pain Rating Scale and Functional Assessment Average Pain (9)   In the last MONTH (on 0-10 scale) has pain interfered with the following?  1. General activity like being  able to carry out your everyday physical activities such as walking, climbing stairs, carrying groceries, or moving a chair?  Rating(9)   +Driver, -BT, -Dye Allergies.

## 2020-01-02 DIAGNOSIS — H353231 Exudative age-related macular degeneration, bilateral, with active choroidal neovascularization: Secondary | ICD-10-CM | POA: Diagnosis not present

## 2020-01-02 DIAGNOSIS — H35423 Microcystoid degeneration of retina, bilateral: Secondary | ICD-10-CM | POA: Diagnosis not present

## 2020-01-02 DIAGNOSIS — H35371 Puckering of macula, right eye: Secondary | ICD-10-CM | POA: Diagnosis not present

## 2020-01-02 DIAGNOSIS — H33191 Other retinoschisis and retinal cysts, right eye: Secondary | ICD-10-CM | POA: Diagnosis not present

## 2020-01-02 NOTE — Progress Notes (Addendum)
Tracy Hammond - 79 y.o. female MRN [id number]  Date of birth: Aug 16, 1941  Office Visit Note: Visit Date: 01/01/2020 PCP: Hoyt Koch, MD Referred by: Hoyt Koch, *  Subjective: Chief Complaint  Patient presents with   Lower Back - Pain   HPI:  Tracy Hammond is a 79 y.o. female who comes in today For planned left L5-S1 interlaminar epidural steroid injection for pretty clear S1 redo complaints.  MRI showing disc problem at L5-S1 coupled with facet arthropathy and lateral recess narrowing on the left could cause S1 radicular pain.  Prior right-sided L4 transforaminal injection did gave her great relief on the right side.  Would consider transforaminal approach depending on how well she does with this.  **Please note that the procedure note written below was dictated as a transforaminal epidural steroid injection and point of fact it was an interlaminar epidural steroid injection.  It should read that it is a left L5-S1 interlaminar injection.  The injection was again done with good flow of contrast and no problems at all.  I am unable to change the procedure note within epic.  ROS Otherwise per HPI.  Assessment & Plan: Visit Diagnoses:  1. Lumbar radiculopathy     Plan: No additional findings.   Meds & Orders:  Meds ordered this encounter  Medications   methylPREDNISolone acetate (DEPO-MEDROL) injection 40 mg    Orders Placed This Encounter  Procedures   XR C-ARM NO REPORT   Epidural Steroid injection    Follow-up: Return if symptoms worsen or fail to improve, for Consider S1 transforaminal injection..   Procedures: No procedures performed  Lumbosacral Transforaminal Epidural Steroid Injection - Sub-Pedicular Approach with Fluoroscopic Guidance  Patient: Tracy Hammond      Date of Birth: [birth year] MRN: [id number] PCP: Hoyt Koch, MD      Visit Date: 01/01/2020   Universal Protocol:    Date/Time: 01/01/2020  Consent Given By: the  patient  Position: PRONE  Additional Comments: Vital signs were monitored before and after the procedure. Patient was prepped and draped in the usual sterile fashion. The correct patient, procedure, and site was verified.   Injection Procedure Details:  Procedure Site One Meds Administered:  Meds ordered this encounter  Medications   methylPREDNISolone acetate (DEPO-MEDROL) injection 40 mg    Laterality: Left  Location/Site:  L5-S1  Needle size: 20 G  Needle type: Spinal  Needle Placement: Transforaminal  Findings:    -Comments: Excellent flow of contrast into the epidural space.  Procedure Details: After squaring off the end-plates to get a true AP view, the C-arm was positioned so that an oblique view of the foramen as noted above was visualized. The target area is just inferior to the "nose of the scotty dog" or sub pedicular. The soft tissues overlying this structure were infiltrated with 2-3 ml. of 1% Lidocaine without Epinephrine.  The spinal needle was inserted toward the target using a "trajectory" view along the fluoroscope beam.  Under AP and lateral visualization, the needle was advanced so it did not puncture dura and was located close the 6 O'Clock position of the pedical in AP tracterory. Biplanar projections were used to confirm position. Aspiration was confirmed to be negative for CSF and/or blood. A 1-2 ml. volume of Isovue-250 was injected and flow of contrast was noted at each level. Radiographs were obtained for documentation purposes.   After attaining the desired flow of contrast documented above, a 0.5 to 1.0 ml test  dose of 0.25% Marcaine was injected into each respective transforaminal space.  The patient was observed for 90 seconds post injection.  After no sensory deficits were reported, and normal lower extremity motor function was noted,   the above injectate was administered so that equal amounts of the injectate were placed at each foramen (level)  into the transforaminal epidural space.   Additional Comments:  The patient tolerated the procedure well Dressing: 2 x 2 sterile gauze and Band-Aid    Post-procedure details: Patient was observed during the procedure. Post-procedure instructions were reviewed.  Patient left the clinic in stable condition.      Clinical History: MRI LUMBAR SPINE WITHOUT CONTRAST  TECHNIQUE: Multiplanar, multisequence MR imaging of the lumbar spine was performed. No intravenous contrast was administered.  COMPARISON:  None.  FINDINGS: Segmentation:  Standard.  Alignment:  Physiologic.  Vertebrae:  No fracture, evidence of discitis, or bone lesion.  Conus medullaris and cauda equina: Conus extends to the L1 level. Conus and cauda equina appear normal.  Paraspinal and other soft tissues: Negative.  Disc levels:  T10-11 and T11-12 are imaged only in the sagittal plane but are normal.  T12-L1: Normal disc space and facets. No spinal canal or neuroforaminal stenosis.  L1-L2: Small central disc protrusion without spinal canal or neural foraminal stenosis. Normal facets.  L2-L3: Left eccentric disc bulge with mild narrowing of the left lateral recess. Normal facets.  L3-L4: Intermediate diffuse disc bulge with narrowing of both lateral recesses and mild central spinal canal stenosis. Mild facet hypertrophy. Mild bilateral foraminal stenosis.  L4-L5: Diffuse left eccentric disc bulge with moderate facet hypertrophy. There is mild central spinal canal stenosis and marked left lateral recess stenosis. There is moderate right and moderate left neural foraminal stenosis. Extraforaminal component of the disc bulge is in close proximity to the exiting right L4 nerve root.  L5-S1: Intermediate disc bulge. Mild right and severe left neural foraminal stenosis. Narrowing of the left lateral recess with displacement of the descending left S1 nerve root.  Visualized sacrum:  Normal.  IMPRESSION: 1. Moderate bilateral L4 neural foraminal stenosis with extraforaminal disc component in close proximity to the exiting right L4 nerve root. Moderate left lateral recess stenosis and mild spinal canal stenosis also at this level. 2. L5-S1 severe left neural foraminal stenosis and lateral recess stenosis that could contribute to radiculopathy in the left L5 or S1 distributions. 3.   Electronically Signed   By: Ulyses Jarred M.D.   On: 05/14/2019 00:15     Objective:  VS:  HT:     WT:    BMI:      BP:(!) 155/68   HR:70bpm   TEMP: ( )   RESP:  Physical Exam  Ortho Exam Imaging: Epidural Steroid injection  Result Date: 01/15/2020 Magnus Sinning, MD     01/15/2020 12:14 PM S1 Lumbosacral Transforaminal Epidural Steroid Injection - Sub-Pedicular Approach with Fluoroscopic Guidance Patient: Tracy Hammond     Date of Birth: May 17, 1941 MRN: [id number] PCP: Hoyt Koch, MD     Visit Date: 01/15/2020  Universal Protocol:   Date/Time: 01/15/2111:10 PM Consent Given By: the patient Position:  PRONE Additional Comments: Vital signs were monitored before and after the procedure. Patient was prepped and draped in the usual sterile fashion. The correct patient, procedure, and site was verified. Injection Procedure Details: Procedure Site One Meds Administered: Meds ordered this encounter Medications  methylPREDNISolone acetate (DEPO-MEDROL) injection 40 mg Laterality: Left Location/Site: S1 Foramen Needle size: 22  ga. Needle type: Spinal Needle Placement: Transforaminal Findings:  -Comments: Excellent flow of contrast along the nerve and into the epidural space. Epidurogram: Contrast epidurogram showed no nerve root cut off or restricted flow pattern. Procedure Details: After squaring off the sacral end-plate to get a true AP view, the C-arm was positioned so that the best possible view of the S1 foramen was visualized. The soft tissues overlying this structure were  infiltrated with 2-3 ml. of 1% Lidocaine without Epinephrine. The spinal needle was inserted toward the target using a "trajectory" view along the fluoroscope beam.  Under AP and lateral visualization, the needle was advanced so it did not puncture dura. Biplanar projections were used to confirm position. Aspiration was confirmed to be negative for CSF and/or blood. A 1-2 ml. volume of Isovue-250 was injected and flow of contrast was noted at each level. Radiographs were obtained for documentation purposes. After attaining the desired flow of contrast documented above, a 0.5 to 1.0 ml test dose of 0.25% Marcaine was injected into each respective transforaminal space.  The patient was observed for 90 seconds post injection.  After no sensory deficits were reported, and normal lower extremity motor function was noted,   the above injectate was administered so that equal amounts of the injectate were placed at each foramen (level) into the transforaminal epidural space. Additional Comments: The patient tolerated the procedure well Dressing: Band-Aid with 2 x 2 sterile gauze  Post-procedure details: Patient was observed during the procedure. Post-procedure instructions were reviewed. Patient left the clinic in stable condition.  XR C-ARM NO REPORT  Result Date: 01/15/2020 Please see Notes tab for imaging impression.

## 2020-01-02 NOTE — Procedures (Signed)
Lumbosacral Transforaminal Epidural Steroid Injection - Sub-Pedicular Approach with Fluoroscopic Guidance  Patient: Tracy Hammond      Date of Birth: 07/09/1941 MRN: [id number] PCP: Hoyt Koch, MD      Visit Date: 01/01/2020   Universal Protocol:    Date/Time: 01/01/2020  Consent Given By: the patient  Position: PRONE  Additional Comments: Vital signs were monitored before and after the procedure. Patient was prepped and draped in the usual sterile fashion. The correct patient, procedure, and site was verified.   Injection Procedure Details:  Procedure Site One Meds Administered:  Meds ordered this encounter  Medications  . methylPREDNISolone acetate (DEPO-MEDROL) injection 40 mg    Laterality: Left  Location/Site:  L5-S1  Needle size: 20 G  Needle type: Spinal  Needle Placement: Transforaminal  Findings:    -Comments: Excellent flow of contrast into the epidural space.  Procedure Details: After squaring off the end-plates to get a true AP view, the C-arm was positioned so that an oblique view of the foramen as noted above was visualized. The target area is just inferior to the "nose of the scotty dog" or sub pedicular. The soft tissues overlying this structure were infiltrated with 2-3 ml. of 1% Lidocaine without Epinephrine.  The spinal needle was inserted toward the target using a "trajectory" view along the fluoroscope beam.  Under AP and lateral visualization, the needle was advanced so it did not puncture dura and was located close the 6 O'Clock position of the pedical in AP tracterory. Biplanar projections were used to confirm position. Aspiration was confirmed to be negative for CSF and/or blood. A 1-2 ml. volume of Isovue-250 was injected and flow of contrast was noted at each level. Radiographs were obtained for documentation purposes.   After attaining the desired flow of contrast documented above, a 0.5 to 1.0 ml test dose of 0.25% Marcaine was  injected into each respective transforaminal space.  The patient was observed for 90 seconds post injection.  After no sensory deficits were reported, and normal lower extremity motor function was noted,   the above injectate was administered so that equal amounts of the injectate were placed at each foramen (level) into the transforaminal epidural space.   Additional Comments:  The patient tolerated the procedure well Dressing: 2 x 2 sterile gauze and Band-Aid    Post-procedure details: Patient was observed during the procedure. Post-procedure instructions were reviewed.  Patient left the clinic in stable condition.

## 2020-01-03 ENCOUNTER — Ambulatory Visit: Payer: Medicare Other | Admitting: Internal Medicine

## 2020-01-04 ENCOUNTER — Ambulatory Visit: Payer: Medicare Other | Admitting: Internal Medicine

## 2020-01-05 ENCOUNTER — Encounter: Payer: Self-pay | Admitting: Internal Medicine

## 2020-01-05 ENCOUNTER — Other Ambulatory Visit: Payer: Self-pay

## 2020-01-05 ENCOUNTER — Ambulatory Visit (INDEPENDENT_AMBULATORY_CARE_PROVIDER_SITE_OTHER): Payer: Medicare Other | Admitting: Internal Medicine

## 2020-01-05 VITALS — BP 152/70 | HR 64 | Temp 97.9°F | Ht 61.5 in | Wt 147.0 lb

## 2020-01-05 DIAGNOSIS — E118 Type 2 diabetes mellitus with unspecified complications: Secondary | ICD-10-CM

## 2020-01-05 LAB — HEMOGLOBIN A1C: Hgb A1c MFr Bld: 7.2 % — ABNORMAL HIGH (ref 4.6–6.5)

## 2020-01-05 NOTE — Patient Instructions (Signed)
We will check the labs today and call you back about the results.    

## 2020-01-05 NOTE — Progress Notes (Signed)
   Subjective:   Patient ID: Tracy Hammond, female    DOB: [birth year], 79 y.o.   MRN: [id number]  HPI The patient is a 79 YO female coming in for follow up diabetes. She had elevated levels at last visit so we doubled dose of metformin and asked her to return in 3 months. She has done this and denies increased side effects. Denies low sugars. Does not check regularly.   Review of Systems  Constitutional: Negative.   HENT: Negative.   Eyes: Negative.   Respiratory: Negative for cough, chest tightness and shortness of breath.   Cardiovascular: Negative for chest pain, palpitations and leg swelling.  Gastrointestinal: Negative for abdominal distention, abdominal pain, constipation, diarrhea, nausea and vomiting.  Musculoskeletal: Positive for arthralgias and back pain.  Skin: Negative.   Neurological: Negative.   Psychiatric/Behavioral: Negative.     Objective:  Physical Exam Constitutional:      Appearance: She is well-developed.  HENT:     Head: Normocephalic and atraumatic.  Cardiovascular:     Rate and Rhythm: Normal rate and regular rhythm.  Pulmonary:     Effort: Pulmonary effort is normal. No respiratory distress.     Breath sounds: Normal breath sounds. No wheezing or rales.  Abdominal:     General: Bowel sounds are normal. There is no distension.     Palpations: Abdomen is soft.     Tenderness: There is no abdominal tenderness. There is no rebound.  Musculoskeletal:        General: Tenderness present.     Cervical back: Normal range of motion.  Skin:    General: Skin is warm and dry.  Neurological:     Mental Status: She is alert and oriented to person, place, and time.     Coordination: Coordination normal.     Vitals:   01/05/20 1031  BP: (!) 152/70  Pulse: 64  Temp: 97.9 F (36.6 C)  SpO2: 99%  Weight: 147 lb (66.7 kg)  Height: 5' 1.5" (1.562 m)    This visit occurred during the SARS-CoV-2 public health emergency.  Safety protocols were in place,  including screening questions prior to the visit, additional usage of staff PPE, and extensive cleaning of exam room while observing appropriate contact time as indicated for disinfecting solutions.   Assessment & Plan:

## 2020-01-05 NOTE — Assessment & Plan Note (Signed)
Checking HgA1c and adjust metformin 1000 mg BID as needed.

## 2020-01-15 ENCOUNTER — Other Ambulatory Visit: Payer: Self-pay

## 2020-01-15 ENCOUNTER — Encounter: Payer: Self-pay | Admitting: Physical Medicine and Rehabilitation

## 2020-01-15 ENCOUNTER — Ambulatory Visit: Payer: Self-pay

## 2020-01-15 ENCOUNTER — Ambulatory Visit (INDEPENDENT_AMBULATORY_CARE_PROVIDER_SITE_OTHER): Payer: Medicare Other | Admitting: Physical Medicine and Rehabilitation

## 2020-01-15 VITALS — BP 173/72 | HR 70 | Ht 61.5 in | Wt 147.0 lb

## 2020-01-15 DIAGNOSIS — M5416 Radiculopathy, lumbar region: Secondary | ICD-10-CM | POA: Diagnosis not present

## 2020-01-15 DIAGNOSIS — M48061 Spinal stenosis, lumbar region without neurogenic claudication: Secondary | ICD-10-CM

## 2020-01-15 MED ORDER — METHYLPREDNISOLONE ACETATE 80 MG/ML IJ SUSP
40.0000 mg | Freq: Once | INTRAMUSCULAR | Status: AC
Start: 2020-01-15 — End: 2020-01-15
  Administered 2020-01-15: 40 mg

## 2020-01-15 NOTE — Progress Notes (Signed)
Patient presents today for a lower back injection. She had a left L5-S1 injection on 01/01/2020. She states that the last injection did not help, but in fact seems like her pain has worsened. Her pain is located in the left side and radiates into her left leg. She states that her lower leg tingles and feels like something is "crawling". Standing makes her pain worse. She takes Ibuprofen as needed, which seems to help. Her pain has been keeping her awake at night. She said typically the injections give her immediate relief, but it didn't last time.    Numeric Pain Rating Scale and Functional Assessment Average Pain 9   In the last MONTH (on 0-10 scale) has pain interfered with the following?  1. General activity like being  able to carry out your everyday physical activities such as walking, climbing stairs, carrying groceries, or moving a chair?  Rating(8)   +Driver- yes BT- Aspirin 81mg  daily  -Dye Allergies- None

## 2020-01-15 NOTE — Progress Notes (Signed)
Tracy Hammond - 79 y.o. female MRN [id number]  Date of birth: 11-23-1940  Office Visit Note: Visit Date: 01/15/2020 PCP: Hoyt Koch, MD Referred by: Hoyt Koch, *  Subjective: Chief Complaint  Patient presents with  . Lower Back - Pain    Injection   HPI:  Tracy Hammond is a 79 y.o. female who comes in today At the request of Dr. Jean Rosenthal.  Back in October we did complete transforaminal injection on the right at L4 with excellent relief of her radicular leg pain.  She has multilevel foraminal narrowing on the right at L4 on the left at L5 with lateral recess narrowing on the left at L5-S1.  No high-grade central stenosis.  Chronic pain in general.  In April she began having pain into the left leg and somewhat of an L5 and S1 distribution.  We completed interlaminar injection without any relief.  I do think 1 more injection on the left would be wise from a transforaminal approach.  This would be a diagnostic injection because the last injection did not help and it was an interlaminar approach.  If she does not get relief with that then at least we could look at an L5 transforaminal injection.  Her symptoms are pretty classic S1 distribution.  **Reviewing the report from the last time she was here a few weeks ago it was dictated as a transforaminal injection.  This is not the case fluoroscopic imaging shows that we did complete interlaminar injection.  We did change her mind that they wanted to perform interlaminar injection because of the lateral recess narrowing.  This today would be a transforaminal approach.  I will put an addendum in the chart but unfortunately I cannot really do the procedure note.  ROS Otherwise per HPI.  Assessment & Plan: Visit Diagnoses:  1. Lumbar radiculopathy   2. Bilateral stenosis of lateral recess of lumbar spine     Plan: No additional findings.   Meds & Orders:  Meds ordered this encounter  Medications  .  methylPREDNISolone acetate (DEPO-MEDROL) injection 40 mg    Orders Placed This Encounter  Procedures  . XR C-ARM NO REPORT  . Epidural Steroid injection    Follow-up: Return if symptoms worsen or fail to improve, for Consider L5 transforaminal injection.   Procedures: No procedures performed  S1 Lumbosacral Transforaminal Epidural Steroid Injection - Sub-Pedicular Approach with Fluoroscopic Guidance   Patient: Tracy Hammond      Date of Birth: 10/15/1940 MRN: [id number] PCP: Hoyt Koch, MD      Visit Date: 01/15/2020   Universal Protocol:    Date/Time: 01/15/2111:10 PM  Consent Given By: the patient  Position:  PRONE  Additional Comments: Vital signs were monitored before and after the procedure. Patient was prepped and draped in the usual sterile fashion. The correct patient, procedure, and site was verified.   Injection Procedure Details:  Procedure Site One Meds Administered:  Meds ordered this encounter  Medications  . methylPREDNISolone acetate (DEPO-MEDROL) injection 40 mg    Laterality: Left  Location/Site:  S1 Foramen   Needle size: 22 ga.  Needle type: Spinal  Needle Placement: Transforaminal  Findings:   -Comments: Excellent flow of contrast along the nerve and into the epidural space.  Epidurogram: Contrast epidurogram showed no nerve root cut off or restricted flow pattern.  Procedure Details: After squaring off the sacral end-plate to get a true AP view, the C-arm was positioned so that the best  possible view of the S1 foramen was visualized. The soft tissues overlying this structure were infiltrated with 2-3 ml. of 1% Lidocaine without Epinephrine.    The spinal needle was inserted toward the target using a "trajectory" view along the fluoroscope beam.  Under AP and lateral visualization, the needle was advanced so it did not puncture dura. Biplanar projections were used to confirm position. Aspiration was confirmed to be negative  for CSF and/or blood. A 1-2 ml. volume of Isovue-250 was injected and flow of contrast was noted at each level. Radiographs were obtained for documentation purposes.   After attaining the desired flow of contrast documented above, a 0.5 to 1.0 ml test dose of 0.25% Marcaine was injected into each respective transforaminal space.  The patient was observed for 90 seconds post injection.  After no sensory deficits were reported, and normal lower extremity motor function was noted,   the above injectate was administered so that equal amounts of the injectate were placed at each foramen (level) into the transforaminal epidural space.   Additional Comments:  The patient tolerated the procedure well Dressing: Band-Aid with 2 x 2 sterile gauze    Post-procedure details: Patient was observed during the procedure. Post-procedure instructions were reviewed.  Patient left the clinic in stable condition.    Clinical History: MRI LUMBAR SPINE WITHOUT CONTRAST  TECHNIQUE: Multiplanar, multisequence MR imaging of the lumbar spine was performed. No intravenous contrast was administered.  COMPARISON:  None.  FINDINGS: Segmentation:  Standard.  Alignment:  Physiologic.  Vertebrae:  No fracture, evidence of discitis, or bone lesion.  Conus medullaris and cauda equina: Conus extends to the L1 level. Conus and cauda equina appear normal.  Paraspinal and other soft tissues: Negative.  Disc levels:  T10-11 and T11-12 are imaged only in the sagittal plane but are normal.  T12-L1: Normal disc space and facets. No spinal canal or neuroforaminal stenosis.  L1-L2: Small central disc protrusion without spinal canal or neural foraminal stenosis. Normal facets.  L2-L3: Left eccentric disc bulge with mild narrowing of the left lateral recess. Normal facets.  L3-L4: Intermediate diffuse disc bulge with narrowing of both lateral recesses and mild central spinal canal stenosis. Mild  facet hypertrophy. Mild bilateral foraminal stenosis.  L4-L5: Diffuse left eccentric disc bulge with moderate facet hypertrophy. There is mild central spinal canal stenosis and marked left lateral recess stenosis. There is moderate right and moderate left neural foraminal stenosis. Extraforaminal component of the disc bulge is in close proximity to the exiting right L4 nerve root.  L5-S1: Intermediate disc bulge. Mild right and severe left neural foraminal stenosis. Narrowing of the left lateral recess with displacement of the descending left S1 nerve root.  Visualized sacrum: Normal.  IMPRESSION: 1. Moderate bilateral L4 neural foraminal stenosis with extraforaminal disc component in close proximity to the exiting right L4 nerve root. Moderate left lateral recess stenosis and mild spinal canal stenosis also at this level. 2. L5-S1 severe left neural foraminal stenosis and lateral recess stenosis that could contribute to radiculopathy in the left L5 or S1 distributions. 3.   Electronically Signed   By: Ulyses Jarred M.D.   On: 05/14/2019 00:15     Objective:  VS:  HT:5' 1.5" (156.2 cm)   WT:147 lb (66.7 kg)  BMI:27.33    BP:(!) 173/72  HR:70bpm  TEMP: ( )  RESP:  Physical Exam Constitutional:      General: She is not in acute distress.    Appearance: Normal appearance. She is  not ill-appearing.  HENT:     Head: Normocephalic and atraumatic.     Right Ear: External ear normal.     Left Ear: External ear normal.  Eyes:     Extraocular Movements: Extraocular movements intact.  Cardiovascular:     Rate and Rhythm: Normal rate.     Pulses: Normal pulses.  Musculoskeletal:     Right lower leg: No edema.     Left lower leg: No edema.     Comments: Patient has good distal strength with no pain over the greater trochanters.  No clonus or focal weakness.  Skin:    Findings: No erythema, lesion or rash.  Neurological:     General: No focal deficit present.      Mental Status: She is alert and oriented to person, place, and time.     Sensory: Sensory deficit present.     Motor: No weakness or abnormal muscle tone.     Coordination: Coordination normal.  Psychiatric:        Mood and Affect: Mood normal.        Behavior: Behavior normal.      Imaging: XR C-ARM NO REPORT  Result Date: 01/15/2020 Please see Notes tab for imaging impression.

## 2020-01-15 NOTE — Procedures (Signed)
S1 Lumbosacral Transforaminal Epidural Steroid Injection - Sub-Pedicular Approach with Fluoroscopic Guidance   Patient: Tracy Hammond      Date of Birth: July 28, 1941 MRN: [id number] PCP: Hoyt Koch, MD      Visit Date: 01/15/2020   Universal Protocol:    Date/Time: 01/15/2111:10 PM  Consent Given By: the patient  Position:  PRONE  Additional Comments: Vital signs were monitored before and after the procedure. Patient was prepped and draped in the usual sterile fashion. The correct patient, procedure, and site was verified.   Injection Procedure Details:  Procedure Site One Meds Administered:  Meds ordered this encounter  Medications  . methylPREDNISolone acetate (DEPO-MEDROL) injection 40 mg    Laterality: Left  Location/Site:  S1 Foramen   Needle size: 22 ga.  Needle type: Spinal  Needle Placement: Transforaminal  Findings:   -Comments: Excellent flow of contrast along the nerve and into the epidural space.  Epidurogram: Contrast epidurogram showed no nerve root cut off or restricted flow pattern.  Procedure Details: After squaring off the sacral end-plate to get a true AP view, the C-arm was positioned so that the best possible view of the S1 foramen was visualized. The soft tissues overlying this structure were infiltrated with 2-3 ml. of 1% Lidocaine without Epinephrine.    The spinal needle was inserted toward the target using a "trajectory" view along the fluoroscope beam.  Under AP and lateral visualization, the needle was advanced so it did not puncture dura. Biplanar projections were used to confirm position. Aspiration was confirmed to be negative for CSF and/or blood. A 1-2 ml. volume of Isovue-250 was injected and flow of contrast was noted at each level. Radiographs were obtained for documentation purposes.   After attaining the desired flow of contrast documented above, a 0.5 to 1.0 ml test dose of 0.25% Marcaine was injected into each  respective transforaminal space.  The patient was observed for 90 seconds post injection.  After no sensory deficits were reported, and normal lower extremity motor function was noted,   the above injectate was administered so that equal amounts of the injectate were placed at each foramen (level) into the transforaminal epidural space.   Additional Comments:  The patient tolerated the procedure well Dressing: Band-Aid with 2 x 2 sterile gauze    Post-procedure details: Patient was observed during the procedure. Post-procedure instructions were reviewed.  Patient left the clinic in stable condition.

## 2020-01-22 ENCOUNTER — Ambulatory Visit (AMBULATORY_SURGERY_CENTER): Payer: Self-pay | Admitting: *Deleted

## 2020-01-22 ENCOUNTER — Other Ambulatory Visit: Payer: Self-pay

## 2020-01-22 VITALS — Ht 61.5 in | Wt 149.0 lb

## 2020-01-22 DIAGNOSIS — Z86010 Personal history of colonic polyps: Secondary | ICD-10-CM

## 2020-01-22 NOTE — Progress Notes (Signed)

## 2020-01-23 ENCOUNTER — Encounter: Payer: Self-pay | Admitting: Internal Medicine

## 2020-01-25 ENCOUNTER — Telehealth: Payer: Self-pay | Admitting: Orthopaedic Surgery

## 2020-01-25 NOTE — Telephone Encounter (Signed)
Patient called advised the injection she had with Dr Ernestina Patches did not work. Patient asked if Dr Ninfa Linden wants to see her in the office? The number to contact patient is [phone]

## 2020-01-25 NOTE — Telephone Encounter (Signed)
The only thing we can do at this standpoint is send her for surgical evaluation with Dr. Inda Merlin or Dr. Louanne Skye or even with Dr. Saintclair Halsted from neurosurgery.

## 2020-01-25 NOTE — Telephone Encounter (Signed)
Please advise Next step?

## 2020-01-31 ENCOUNTER — Telehealth: Payer: Self-pay | Admitting: Orthopaedic Surgery

## 2020-01-31 NOTE — Telephone Encounter (Signed)
Called patient left message to return call to schedule an appointment with Dr Lorin Mercy or Dr Louanne Skye per Dr. Ninfa Linden

## 2020-01-31 NOTE — Telephone Encounter (Signed)
Would you mind calling her and setting her up with Lorin Mercy or Louanne Skye whomever she would prefer?

## 2020-02-05 ENCOUNTER — Encounter: Payer: Self-pay | Admitting: Internal Medicine

## 2020-02-05 ENCOUNTER — Ambulatory Visit (AMBULATORY_SURGERY_CENTER): Payer: Medicare Other | Admitting: Internal Medicine

## 2020-02-05 ENCOUNTER — Other Ambulatory Visit: Payer: Self-pay

## 2020-02-05 VITALS — BP 122/65 | HR 67 | Temp 96.8°F | Resp 18 | Ht 61.0 in | Wt 149.0 lb

## 2020-02-05 DIAGNOSIS — Z86010 Personal history of colonic polyps: Secondary | ICD-10-CM

## 2020-02-05 MED ORDER — SODIUM CHLORIDE 0.9 % IV SOLN
500.0000 mL | Freq: Once | INTRAVENOUS | Status: DC
Start: 2020-02-05 — End: 2020-02-05

## 2020-02-05 NOTE — Patient Instructions (Addendum)
Handout given: Diverticulosis Resume previous diet Continue present medications Start taking immodium daily for diarrhea Start using baby wipes after each bowel movement and then apply vaseline or A&D ointment to broken down area on buttocks.  Limit use of time spent on right hip for the next three weeks to facilitate healing.   No polyps today.  Not recommending any further routine colonoscopy.  I appreciate the opportunity to care for you. Gatha Mayer, MD, FACG  YOU HAD AN ENDOSCOPIC PROCEDURE TODAY AT [facility]:   Refer to the procedure report that was given to you for any specific questions about what was found during the examination.  If the procedure report does not answer your questions, please call your gastroenterologist to clarify.  If you requested that your care partner not be given the details of your procedure findings, then the procedure report has been included in a sealed envelope for you to review at your convenience later.  YOU SHOULD EXPECT: Some feelings of bloating in the abdomen. Passage of more gas than usual.  Walking can help get rid of the air that was put into your GI tract during the procedure and reduce the bloating. If you had a lower endoscopy (such as a colonoscopy or flexible sigmoidoscopy) you may notice spotting of blood in your stool or on the toilet paper. If you underwent a bowel prep for your procedure, you may not have a normal bowel movement for a few days.  Please Note:  You might notice some irritation and congestion in your nose or some drainage.  This is from the oxygen used during your procedure.  There is no need for concern and it should clear up in a day or so.  SYMPTOMS TO REPORT IMMEDIATELY:   Following lower endoscopy (colonoscopy or flexible sigmoidoscopy):  Excessive amounts of blood in the stool  Significant tenderness or worsening of abdominal pains  Swelling of the abdomen that is new, acute  Fever of 100F or  higher   For urgent or emergent issues, a gastroenterologist can be reached at any hour by calling [phone]. Do not use MyChart messaging for urgent concerns.    DIET:  We do recommend a small meal at first, but then you may proceed to your regular diet.  Drink plenty of fluids but you should avoid alcoholic beverages for 24 hours.  ACTIVITY:  You should plan to take it easy for the rest of today and you should NOT DRIVE or use heavy machinery until tomorrow (because of the sedation medicines used during the test).    FOLLOW UP: Our staff will call the number listed on your records 48-72 hours following your procedure to check on you and address any questions or concerns that you may have regarding the information given to you following your procedure. If we do not reach you, we will leave a message.  We will attempt to reach you two times.  During this call, we will ask if you have developed any symptoms of COVID 19. If you develop any symptoms (ie: fever, flu-like symptoms, shortness of breath, cough etc.) before then, please call [phone].  If you test positive for Covid 19 in the 2 weeks post procedure, please call and report this information to [place].    If any biopsies were taken you will be contacted by phone or by letter within the next 1-3 weeks.  Please call us at [phone] if you have not heard about the biopsies in  3 weeks.    SIGNATURES/CONFIDENTIALITY: You and/or your care partner have signed paperwork which will be entered into your electronic medical record.  These signatures attest to the fact that that the information above on your After Visit Summary has been reviewed and is understood.  Full responsibility of the confidentiality of this discharge information lies with you and/or your care-partner.

## 2020-02-05 NOTE — Progress Notes (Signed)
A/ox3, pleased with MAC, report to RN 

## 2020-02-05 NOTE — Op Note (Signed)
[place] Patient Name: Tracy Hammond Procedure Date: 02/05/2020 8:44 AM MRN: [id number] Endoscopist: Gatha Mayer , MD Age: 79 Referring MD:  Date of Birth: 1941-06-23 Gender: Female Account #: [id number] Procedure:                Colonoscopy Indications:              Surveillance: Personal history of adenomatous                            polyps on last colonoscopy 3 years ago Medicines:                Propofol per Anesthesia, Monitored Anesthesia Care Procedure:                Pre-Anesthesia Assessment:                           - Prior to the procedure, a History and Physical                            was performed, and patient medications and                            allergies were reviewed. The patient's tolerance of                            previous anesthesia was also reviewed. The risks                            and benefits of the procedure and the sedation                            options and risks were discussed with the patient.                            All questions were answered, and informed consent                            was obtained. Prior Anticoagulants: The patient has                            taken no previous anticoagulant or antiplatelet                            agents. ASA Grade Assessment: II - A patient with                            mild systemic disease. After reviewing the risks                            and benefits, the patient was deemed in                            satisfactory condition to undergo the procedure.  After obtaining informed consent, the colonoscope                            was passed under direct vision. Throughout the                            procedure, the patient's blood pressure, pulse, and                            oxygen saturations were monitored continuously. The                            Colonoscope was introduced through the anus and   advanced to the the cecum, identified by                            appendiceal orifice and ileocecal valve. The                            colonoscopy was performed without difficulty. The                            patient tolerated the procedure well. The quality                            of the bowel preparation was good. The ileocecal                            valve, appendiceal orifice, and rectum were                            photographed. The bowel preparation used was                            Miralax via split dose instruction. Scope In: 8:47:52 AM Scope Out: 9:01:30 AM Scope Withdrawal Time: 0 hours 11 minutes 18 seconds  Total Procedure Duration: 0 hours 13 minutes 38 seconds  Findings:                 The perianal exam findings include superficial skin                            breakdown.                           Multiple small and large-mouthed diverticula were                            found in the sigmoid colon, descending colon,                            transverse colon and ascending colon.                           The exam was otherwise without abnormality on  direct and retroflexion views. Complications:            No immediate complications. Estimated Blood Loss:     Estimated blood loss: none. Impression:               - Superficial skin breakdown found on perianal exam.                           - Diverticulosis in the sigmoid colon, in the                            descending colon, in the transverse colon and in                            the ascending colon.                           - The examination was otherwise normal on direct                            and retroflexion views.                           - No specimens collected.                           - Personal history of colonic polyps. Recommendation:           - Patient has a contact number available for                            emergencies. The signs and  symptoms of potential                            delayed complications were discussed with the                            patient. Return to normal activities tomorrow.                            Written discharge instructions were provided to the                            patient.                           - Resume previous diet.                           - Continue present medications.                           - No repeat colonoscopy due to age. Gatha Mayer, MD 02/05/2020 9:09:54 AM This report has been signed electronically.

## 2020-02-05 NOTE — Progress Notes (Signed)
Pt's states no medical or surgical changes since previsit or office visit. 

## 2020-02-07 ENCOUNTER — Telehealth: Payer: Self-pay | Admitting: *Deleted

## 2020-02-07 ENCOUNTER — Telehealth: Payer: Self-pay

## 2020-02-07 NOTE — Telephone Encounter (Signed)
  Follow up Call-  Call back number 02/05/2020  Post procedure Call Back phone  # [phone]  Permission to leave phone message Yes  Some recent data might be hidden     Patient questions:  Do you have a fever, pain , or abdominal swelling? No. Pain Score  0 *  Have you tolerated food without any problems? Yes.    Have you been able to return to your normal activities? Yes.    Do you have any questions about your discharge instructions: Diet   No. Medications  No. Follow up visit  No.  Do you have questions or concerns about your Care? No.  Actions: * If pain score is 4 or above: No action needed, pain <4.  1. Have you developed a fever since your procedure? no  2.   Have you had an respiratory symptoms (SOB or cough) since your procedure? no  3.   Have you tested positive for COVID 19 since your procedure no  4.   Have you had any family members/close contacts diagnosed with the COVID 19 since your procedure?  no   If yes to any of these questions please route to Joylene John, RN and Erenest Rasher, RN

## 2020-02-07 NOTE — Telephone Encounter (Signed)
No answer, left message to call back later today, B.Henrine Hayter RN. 

## 2020-02-14 ENCOUNTER — Encounter: Payer: Self-pay | Admitting: Orthopaedic Surgery

## 2020-02-14 ENCOUNTER — Ambulatory Visit (INDEPENDENT_AMBULATORY_CARE_PROVIDER_SITE_OTHER): Payer: Medicare Other | Admitting: Orthopaedic Surgery

## 2020-02-14 VITALS — BP 159/68 | HR 64 | Ht 61.0 in | Wt 149.0 lb

## 2020-02-14 DIAGNOSIS — M5442 Lumbago with sciatica, left side: Secondary | ICD-10-CM

## 2020-02-14 MED ORDER — GABAPENTIN 100 MG PO CAPS: 100.0000 mg | ORAL_CAPSULE | Freq: Two times a day (BID) | ORAL | 1 refills | Status: DC

## 2020-02-15 NOTE — Progress Notes (Signed)
Office Visit Note   Patient: Tracy Hammond           Date of Birth: 28-Jul-1941           MRN: [id number] Visit Date: 02/14/2020              Requested by: Hoyt Koch, MD [address] [place],  [place] [address] PCP: Hoyt Koch, MD   Assessment & Plan: Visit Diagnoses:  1. Acute left-sided low back pain with left-sided sciatica     Plan: Patient has chronic significant back pain and has failed anti-inflammatories.  She has been on a prednisone Dosepak in the past multiple epidurals.  We will try some Neurontin starting 100 mg daily for 5 days then 200 mg total daily.  We will update her MRI scan since she do not have 1 9 months to consider operative intervention with new onset of left leg symptoms worse than last year with potential for progression of her disc protrusions on the left at L4-5 and L5-S1.  Office follow-up after scan for review.  Previous MRI scan was reviewed as well as radiology report pathophysiology of disc degeneration discussed surgical options are discussed including decompression instrumented fusion disc arthroplasty etc.  She understands with her multilevel disease she is not a candidate for disc arthroplasty.  Return after new MRI scan.  Follow-Up Instructions: No follow-ups on file.   Orders:  Orders Placed This Encounter  Procedures  . MR Lumbar Spine w/o contrast   Meds ordered this encounter  Medications  . gabapentin (NEURONTIN) 100 MG capsule    Sig: Take 1 capsule (100 mg total) by mouth 2 (two) times daily.    Dispense:  60 capsule    Refill:  1      Procedures: No procedures performed   Clinical Data: No additional findings.   Subjective: Chief Complaint  Patient presents with  . Lower Back - Pain, Follow-up    MRI Lumbar Review    HPI 79 year old female with chronic back pain has been followed by Dr. Ninfa Linden with multiple epidural injections with Dr. Ernestina Patches.  Last injections were 5/3 and 01/15/2020.  Last  MRI was more than 9 months ago.  She previously had more right leg symptoms now having more left leg symptoms with pain cramping tingling that shoots down to her toes and has some claudication symptoms with standing at 15 minutes and with ambulation more than block.  She has pain with sitting.  Previous MRI showed left L4-5 and L5-S1 disc protrusions.  Patient is here for discussion about possible surgical intervention.  She does have some right hip osteoarthritis which is not progressed to the point of total of arthroplasty with Dr. Ninfa Linden.  Review of Systems 14 point system update unchanged from 12/04/2019 office visit other than as mentioned above.   Objective: Vital Signs: BP (!) 159/68   Pulse 64   Ht 5\' 1"  (1.549 m)   Wt 149 lb (67.6 kg)   BMI 28.15 kg/m   Physical Exam Constitutional:      Appearance: She is well-developed.  HENT:     Head: Normocephalic.     Right Ear: External ear normal.     Left Ear: External ear normal.  Eyes:     Pupils: Pupils are equal, round, and reactive to light.  Neck:     Thyroid: No thyromegaly.     Trachea: No tracheal deviation.  Cardiovascular:     Rate and Rhythm: Normal rate.  Pulmonary:     Effort: Pulmonary effort is normal.  Abdominal:     Palpations: Abdomen is soft.  Skin:    General: Skin is warm and dry.  Neurological:     Mental Status: She is alert and oriented to person, place, and time.  Psychiatric:        Behavior: Behavior normal.     Ortho Exam patient has some pain with straight leg raising on the left positive popliteal compression test mild sciatic notch tenderness no trochanteric bursal tenderness.  Limitation internal rotation right hip with 10 degrees and some groin pain.  Internal/external rotation left hip is nonpainful.  Knees reach full extension knee and ankle jerk are intact anterior tib EHL is intact.  She has a slow short stride gait without limping.  Specialty Comments:  No specialty comments  available.  Imaging: CLINICAL DATA:  Low back pain radiating to the right leg  EXAM: MRI LUMBAR SPINE WITHOUT CONTRAST  TECHNIQUE: Multiplanar, multisequence MR imaging of the lumbar spine was performed. No intravenous contrast was administered.  COMPARISON:  None.  FINDINGS: Segmentation:  Standard.  Alignment:  Physiologic.  Vertebrae:  No fracture, evidence of discitis, or bone lesion.  Conus medullaris and cauda equina: Conus extends to the L1 level. Conus and cauda equina appear normal.  Paraspinal and other soft tissues: Negative.  Disc levels:  T10-11 and T11-12 are imaged only in the sagittal plane but are normal.  T12-L1: Normal disc space and facets. No spinal canal or neuroforaminal stenosis.  L1-L2: Small central disc protrusion without spinal canal or neural foraminal stenosis. Normal facets.  L2-L3: Left eccentric disc bulge with mild narrowing of the left lateral recess. Normal facets.  L3-L4: Intermediate diffuse disc bulge with narrowing of both lateral recesses and mild central spinal canal stenosis. Mild facet hypertrophy. Mild bilateral foraminal stenosis.  L4-L5: Diffuse left eccentric disc bulge with moderate facet hypertrophy. There is mild central spinal canal stenosis and marked left lateral recess stenosis. There is moderate right and moderate left neural foraminal stenosis. Extraforaminal component of the disc bulge is in close proximity to the exiting right L4 nerve root.  L5-S1: Intermediate disc bulge. Mild right and severe left neural foraminal stenosis. Narrowing of the left lateral recess with displacement of the descending left S1 nerve root.  Visualized sacrum: Normal.  IMPRESSION: 1. Moderate bilateral L4 neural foraminal stenosis with extraforaminal disc component in close proximity to the exiting right L4 nerve root. Moderate left lateral recess stenosis and mild spinal canal stenosis also at this  level. 2. L5-S1 severe left neural foraminal stenosis and lateral recess stenosis that could contribute to radiculopathy in the left L5 or S1 distributions. 3.   Electronically Signed   By: Ulyses Jarred M.D.   On: 05/14/2019 00:15   PMFS History: Patient Active Problem List   Diagnosis Date Noted  . Unilateral primary osteoarthritis, right hip 12/29/2017  . Rash 10/29/2017  . Smokers' cough ([place]) 11/13/2016  . Routine general medical examination at a health care facility 09/05/2015  . Diarrhea 03/15/2015  . Restless leg syndrome 08/28/2014  . VARICOSE VEINS, LOWER EXTREMITIES 05/14/2008  . Hyperlipidemia associated with type 2 diabetes mellitus ([place]) 04/14/2007  . Diabetes mellitus type 2 with complications ([place]) 67/89/3810  . Essential hypertension 03/29/2007   Past Medical History:  Diagnosis Date  . Allergy    seasonal  . Arthritis    fingers, back  . Bursitis   . Cancer ([place])    skin cancer  arm and face  . Cataract    surgery  . Diabetes mellitus   . GERD (gastroesophageal reflux disease)   . Glaucoma   . Hyperlipidemia    no meds currently 01/22/20  . Hypertension   . Personal history of colonic adenoma 06/12/2008  . Thyroid disease    young adult-no meds now    Family History  Problem Relation Age of Onset  . Hypertension Father        family hx  . Diabetes Mother   . Kidney failure Mother   . Heart disease Mother   . Stroke Sister   . Other Sister        Leg Disease  . Heart disease Son   . Colon cancer Neg Hx   . Esophageal cancer Neg Hx   . Rectal cancer Neg Hx   . Stomach cancer Neg Hx     Past Surgical History:  Procedure Laterality Date  . Bladder Tact    . CATARACT EXTRACTION  09-07-2012   rt eye  . CATARACT EXTRACTION Right 08/2012  . CHOLECYSTECTOMY    . COLONOSCOPY    . SKIN CANCER EXCISION    . TONSILLECTOMY AND ADENOIDECTOMY    . TUBAL LIGATION     Social History   Occupational History  . Occupation: housewife  Tobacco  Use  . Smoking status: Current Every Day Smoker    Packs/day: 0.50    Years: 60.00    Pack years: 30.00    Types: Cigarettes  . Smokeless tobacco: Never Used  Vaping Use  . Vaping Use: Never used  Substance and Sexual Activity  . Alcohol use: No  . Drug use: No  . Sexual activity: Not on file

## 2020-02-23 ENCOUNTER — Other Ambulatory Visit: Payer: Self-pay | Admitting: Internal Medicine

## 2020-02-26 ENCOUNTER — Other Ambulatory Visit: Payer: Self-pay | Admitting: Internal Medicine

## 2020-02-27 DIAGNOSIS — H31012 Macula scars of posterior pole (postinflammatory) (post-traumatic), left eye: Secondary | ICD-10-CM | POA: Diagnosis not present

## 2020-02-27 DIAGNOSIS — H353213 Exudative age-related macular degeneration, right eye, with inactive scar: Secondary | ICD-10-CM | POA: Diagnosis not present

## 2020-02-27 DIAGNOSIS — H353221 Exudative age-related macular degeneration, left eye, with active choroidal neovascularization: Secondary | ICD-10-CM | POA: Diagnosis not present

## 2020-02-27 DIAGNOSIS — H35371 Puckering of macula, right eye: Secondary | ICD-10-CM | POA: Diagnosis not present

## 2020-03-15 ENCOUNTER — Other Ambulatory Visit: Payer: Self-pay | Admitting: Internal Medicine

## 2020-03-16 ENCOUNTER — Ambulatory Visit
Admission: RE | Admit: 2020-03-16 | Discharge: 2020-03-16 | Disposition: A | Discharge status: Home / Self Care | Payer: Medicare Other | Source: Ambulatory Visit | Attending: Orthopaedic Surgery | Admitting: Orthopaedic Surgery

## 2020-03-16 ENCOUNTER — Other Ambulatory Visit: Payer: Self-pay

## 2020-03-16 DIAGNOSIS — M48061 Spinal stenosis, lumbar region without neurogenic claudication: Secondary | ICD-10-CM | POA: Diagnosis not present

## 2020-03-16 DIAGNOSIS — M5442 Lumbago with sciatica, left side: Secondary | ICD-10-CM

## 2020-03-19 ENCOUNTER — Ambulatory Visit (INDEPENDENT_AMBULATORY_CARE_PROVIDER_SITE_OTHER): Payer: Medicare Other | Admitting: Orthopaedic Surgery

## 2020-03-19 ENCOUNTER — Encounter: Payer: Self-pay | Admitting: Orthopaedic Surgery

## 2020-03-19 DIAGNOSIS — M48062 Spinal stenosis, lumbar region with neurogenic claudication: Secondary | ICD-10-CM

## 2020-03-19 DIAGNOSIS — M5126 Other intervertebral disc displacement, lumbar region: Secondary | ICD-10-CM | POA: Diagnosis not present

## 2020-03-19 DIAGNOSIS — M48061 Spinal stenosis, lumbar region without neurogenic claudication: Secondary | ICD-10-CM | POA: Insufficient documentation

## 2020-03-19 NOTE — Progress Notes (Signed)
Office Visit Note   Patient: Tracy Hammond           Date of Birth: 12-07-1940           MRN: [id number] Visit Date: 03/19/2020              Requested by: Hoyt Koch, MD [address] [place] [address] PCP: Hoyt Koch, MD   Assessment & Plan: Visit Diagnoses:  1. Protrusion of lumbar intervertebral disc   2. Spinal stenosis of lumbar region with neurogenic claudication     Plan: Patient has enlargement disc protrusion L5-S1 with nerve compression and radiculopathy.  She also has stable moderate stenosis at L4-5.  She has an anniversary coming up at around Labor Day.  She will come back in a month as she is doing.  We discussed surgical options including microdiscectomy on the left at L5-S1 as well as decompression at L4-5.  Recheck 1 month.  She is already been through prednisone treatment epidural steroids exercises.  She is on Neurontin and can increase it to 100 mg twice a day.  MRI images and report reviewed with patient and husband I gave her a copy of the report.  Follow-Up Instructions: Return in about 1 month (around 04/19/2020).   Orders:  No orders of the defined types were placed in this encounter.  No orders of the defined types were placed in this encounter.     Procedures: No procedures performed   Clinical Data: No additional findings.   Subjective: Chief Complaint  Patient presents with  . Lower Back - Pain, Follow-up    MRI Lumbar Review    HPI 79 year old female returns post MRI scan for evaluation of ongoing problems with back pain left leg pain.  She has some mild claudication symptoms with standing she does better if she moves around.  She normally does not have to sit she can walk to the grocery store does not always have to use the cart.  She has known moderate stenosis at L4-5.  New MRI scan has been obtained she is here with her husband for review.  Positive diabetes hypertension hyperlipidemia.  Past history of  smoking.  Review of Systems possible glaucoma surgery left eye doing better.  She has got macular degeneration had injections in the past decreased visual acuity bilaterally.  Otherwise noncontributory to HPI   Objective: Vital Signs: BP (!) 154/62   Pulse 64   Ht 5\' 1"  (1.549 m)   Wt 149 lb (67.6 kg)   BMI 28.15 kg/m   Physical Exam Constitutional:      Appearance: She is well-developed.  HENT:     Head: Normocephalic.     Right Ear: External ear normal.     Left Ear: External ear normal.  Eyes:     Extraocular Movements: Extraocular movements intact.  Neck:     Thyroid: No thyromegaly.     Trachea: No tracheal deviation.  Cardiovascular:     Rate and Rhythm: Normal rate.  Pulmonary:     Effort: Pulmonary effort is normal.  Abdominal:     Palpations: Abdomen is soft.  Skin:    General: Skin is warm and dry.  Neurological:     Mental Status: She is alert and oriented to person, place, and time.  Psychiatric:        Behavior: Behavior normal.     Ortho Exam patient has pain with straight leg raising positive popliteal compression test.  She  ambulates with a very slow short stride gait.  Sciatic notch tenderness on the left negative on the right.  Specialty Comments:  No specialty comments available.  Imaging: CLINICAL DATA:  Low back pain. Left lower extremity numbness, tingling, and weakness.  EXAM: MRI LUMBAR SPINE WITHOUT CONTRAST  TECHNIQUE: Multiplanar, multisequence MR imaging of the lumbar spine was performed. No intravenous contrast was administered.  COMPARISON:  05/13/2019  FINDINGS: Segmentation:  Standard.  Alignment: Chronic straightening of the normal lumbar lordosis. No significant listhesis.  Vertebrae: No fracture or suspicious osseous lesion. Mild degenerative endplate edema on the right at L4-5 and on the left at L5-S1, slightly increased at L4-5 compared to the prior MRI.  Conus medullaris and cauda equina: Conus extends to  the L1 level. Conus and cauda equina appear normal.  Paraspinal and other soft tissues: Unremarkable.  Disc levels:  Disc desiccation and moderate disc space narrowing throughout the lumbar spine.  T12-L1: Slight facet and ligamentum flavum hypertrophy without disc herniation or stenosis.  L1-2: Small central disc extrusion and mild left facet arthrosis without stenosis, unchanged.  L2-3: Disc bulging eccentric to the left results in mild left lateral recess stenosis without spinal or neural foraminal stenosis, unchanged.  L3-4: Circumferential disc bulging and moderate right and mild left facet and ligamentum flavum hypertrophy result in mild spinal stenosis, mild to moderate bilateral lateral recess stenosis, and mild bilateral neural foraminal stenosis, unchanged.  L4-5: Circumferential disc bulging and severe right and mild-to-moderate left facet and ligamentum flavum hypertrophy result in moderate spinal stenosis, moderate right and severe left lateral recess stenosis, and moderate bilateral neural foraminal stenosis, unchanged. Potential bilateral L5 nerve root impingement, particularly on the left.  L5-S1: A left paracentral and subarticular disc protrusion has enlarged and results in progressive, severe left lateral recess stenosis and likely left S1 nerve root impingement. Disc bulging and mild facet hypertrophy result in moderate right lateral recess stenosis and mild right and moderate to severe left neural foraminal stenosis, stable to minimally increased.  IMPRESSION: 1. Enlargement of an L5-S1 disc protrusion with increased left lateral recess stenosis and left S1 nerve root impingement. 2. Chronic moderate to severe left neural foraminal stenosis at L5-S1. 3. Unchanged moderate spinal stenosis, left greater than right lateral recess stenosis, and moderate bilateral neural foraminal stenosis at L4-5.   Electronically Signed   By: Logan Bores M.D.   On: 03/18/2020 10:54   PMFS History: Patient Active Problem List   Diagnosis Date Noted  . Protrusion of lumbar intervertebral disc 03/19/2020  . Spinal stenosis of lumbar region 03/19/2020  . Unilateral primary osteoarthritis, right hip 12/29/2017  . Rash 10/29/2017  . Smokers' cough ([place]) 11/13/2016  . Routine general medical examination at a health care facility 09/05/2015  . Diarrhea 03/15/2015  . Restless leg syndrome 08/28/2014  . VARICOSE VEINS, LOWER EXTREMITIES 05/14/2008  . Hyperlipidemia associated with type 2 diabetes mellitus ([place]) 04/14/2007  . Diabetes mellitus type 2 with complications ([place]) 70/17/7939  . Essential hypertension 03/29/2007   Past Medical History:  Diagnosis Date  . Allergy    seasonal  . Arthritis    fingers, back  . Bursitis   . Cancer ([place])    skin cancer arm and face  . Cataract    surgery  . Diabetes mellitus   . GERD (gastroesophageal reflux disease)   . Glaucoma   . Hyperlipidemia    no meds currently 01/22/20  . Hypertension   . Personal history of colonic adenoma 06/12/2008  .  Thyroid disease    young adult-no meds now    Family History  Problem Relation Age of Onset  . Hypertension Father        family hx  . Diabetes Mother   . Kidney failure Mother   . Heart disease Mother   . Stroke Sister   . Other Sister        Leg Disease  . Heart disease Son   . Colon cancer Neg Hx   . Esophageal cancer Neg Hx   . Rectal cancer Neg Hx   . Stomach cancer Neg Hx     Past Surgical History:  Procedure Laterality Date  . Bladder Tact    . CATARACT EXTRACTION  09-07-2012   rt eye  . CATARACT EXTRACTION Right 08/2012  . CHOLECYSTECTOMY    . COLONOSCOPY    . SKIN CANCER EXCISION    . TONSILLECTOMY AND ADENOIDECTOMY    . TUBAL LIGATION     Social History   Occupational History  . Occupation: housewife  Tobacco Use  . Smoking status: Current Every Day Smoker    Packs/day: 0.50    Years: 60.00    Pack years:  30.00    Types: Cigarettes  . Smokeless tobacco: Never Used  Vaping Use  . Vaping Use: Never used  Substance and Sexual Activity  . Alcohol use: No  . Drug use: No  . Sexual activity: Not on file

## 2020-04-23 ENCOUNTER — Ambulatory Visit (INDEPENDENT_AMBULATORY_CARE_PROVIDER_SITE_OTHER): Payer: Medicare Other | Admitting: Orthopaedic Surgery

## 2020-04-23 ENCOUNTER — Encounter: Payer: Self-pay | Admitting: Orthopaedic Surgery

## 2020-04-23 VITALS — BP 150/63 | HR 59 | Ht 60.0 in | Wt 143.0 lb

## 2020-04-23 DIAGNOSIS — M48062 Spinal stenosis, lumbar region with neurogenic claudication: Secondary | ICD-10-CM

## 2020-04-23 DIAGNOSIS — M5126 Other intervertebral disc displacement, lumbar region: Secondary | ICD-10-CM

## 2020-04-23 NOTE — Progress Notes (Signed)
Office Visit Note   Patient: Tracy Hammond           Date of Birth: [birth year]           MRN: [id number] Visit Date: 04/23/2020              Requested by: Tracy Koch, MD [address] [place],  [place] [address] PCP: Tracy Koch, MD   Assessment & Plan: Visit Diagnoses:  1. Protrusion of lumbar intervertebral disc   2. Spinal stenosis of lumbar region with neurogenic claudication      Plan: Patient states she is ready to proceed with microdiscectomy on the left at L5-S1 and also central decompression at L4-5 where she has moderate stenosis primarily from ligamentous thickening and facet arthritis.  She does not have instability.  She will need to have medical clearance.  Risks of surgery discussed including instability, dural tear reoperation.  Risks of anesthesia, pneumonia, need for possible later fusion.  Questions elicited and answered with patient and her husband.  They request we proceed.  Follow-Up Instructions: No follow-ups on file.   Orders:  No orders of the defined types were placed in this encounter.  No orders of the defined types were placed in this encounter.     Procedures: No procedures performed   Clinical Data: No additional findings.   Subjective: No chief complaint on file.   HPI 79 year old female returns with left L5-S1 disc protrusion with radiculopathy and moderate stenosis L4-5.  She has had 1 falling episode.  In the past epidurals have been effective the last epidurals have not been helpful she is having increased problems walking has had 1 falling episode and states she is now ready to consider surgical intervention.  She has tried anti-inflammatories, Neurontin, prednisone treatment, exercise program, muscle relaxants all without relief.  MRI report is listed below.  Review of Systems positive for poor vision with macular degeneration she had previous surgery left.  Patient is a smoker.  Positive for diabetes  hypertension hyperlipidemia.   Objective: Vital Signs: There were no vitals taken for this visit.  Physical Exam Constitutional:      Appearance: She is well-developed.  HENT:     Head: Normocephalic.     Right Ear: External ear normal.     Left Ear: External ear normal.  Eyes:     Pupils: Pupils are equal, round, and reactive to light.  Neck:     Thyroid: No thyromegaly.     Trachea: No tracheal deviation.  Cardiovascular:     Rate and Rhythm: Normal rate.  Pulmonary:     Effort: Pulmonary effort is normal.  Abdominal:     Palpations: Abdomen is soft.  Skin:    General: Skin is warm and dry.  Neurological:     Mental Status: She is alert and oriented to person, place, and time.  Psychiatric:        Behavior: Behavior normal.     Ortho Exam patient has sciatic notch tenderness to left pain with straight leg raising on the left at 80 degrees.  Palpable distal pulse.  No pitting edema.  Negative logroll to the hips.  Anterior tib EHL is strong.  Specialty Comments:  No specialty comments available.  Imaging: CLINICAL DATA:  Low back pain. Left lower extremity numbness, tingling, and weakness.  EXAM: MRI LUMBAR SPINE WITHOUT CONTRAST  TECHNIQUE: Multiplanar, multisequence MR imaging of the lumbar spine was performed. No intravenous contrast was administered.  COMPARISON:  05/13/2019  FINDINGS: Segmentation:  Standard.  Alignment: Chronic straightening of the normal lumbar lordosis. No significant listhesis.  Vertebrae: No fracture or suspicious osseous lesion. Mild degenerative endplate edema on the right at L4-5 and on the left at L5-S1, slightly increased at L4-5 compared to the prior MRI.  Conus medullaris and cauda equina: Conus extends to the L1 level. Conus and cauda equina appear normal.  Paraspinal and other soft tissues: Unremarkable.  Disc levels:  Disc desiccation and moderate disc space narrowing throughout the lumbar  spine.  T12-L1: Slight facet and ligamentum flavum hypertrophy without disc herniation or stenosis.  L1-2: Small central disc extrusion and mild left facet arthrosis without stenosis, unchanged.  L2-3: Disc bulging eccentric to the left results in mild left lateral recess stenosis without spinal or neural foraminal stenosis, unchanged.  L3-4: Circumferential disc bulging and moderate right and mild left facet and ligamentum flavum hypertrophy result in mild spinal stenosis, mild to moderate bilateral lateral recess stenosis, and mild bilateral neural foraminal stenosis, unchanged.  L4-5: Circumferential disc bulging and severe right and mild-to-moderate left facet and ligamentum flavum hypertrophy result in moderate spinal stenosis, moderate right and severe left lateral recess stenosis, and moderate bilateral neural foraminal stenosis, unchanged. Potential bilateral L5 nerve root impingement, particularly on the left.  L5-S1: A left paracentral and subarticular disc protrusion has enlarged and results in progressive, severe left lateral recess stenosis and likely left S1 nerve root impingement. Disc bulging and mild facet hypertrophy result in moderate right lateral recess stenosis and mild right and moderate to severe left neural foraminal stenosis, stable to minimally increased.  IMPRESSION: 1. Enlargement of an L5-S1 disc protrusion with increased left lateral recess stenosis and left S1 nerve root impingement. 2. Chronic moderate to severe left neural foraminal stenosis at L5-S1. 3. Unchanged moderate spinal stenosis, left greater than right lateral recess stenosis, and moderate bilateral neural foraminal stenosis at L4-5.   Electronically Signed   By: Logan Bores M.D.   On: 03/18/2020 10:54   PMFS History: Patient Active Problem List   Diagnosis Date Noted  . Protrusion of lumbar intervertebral disc 03/19/2020  . Spinal stenosis of lumbar region  03/19/2020  . Unilateral primary osteoarthritis, right hip 12/29/2017  . Rash 10/29/2017  . Smokers' cough ([place]) 11/13/2016  . Routine general medical examination at a health care facility 09/05/2015  . Diarrhea 03/15/2015  . Restless leg syndrome 08/28/2014  . VARICOSE VEINS, LOWER EXTREMITIES 05/14/2008  . Hyperlipidemia associated with type 2 diabetes mellitus ([place]) 04/14/2007  . Diabetes mellitus type 2 with complications ([place]) 30/86/5784  . Essential hypertension 03/29/2007   Past Medical History:  Diagnosis Date  . Allergy    seasonal  . Arthritis    fingers, back  . Bursitis   . Cancer ([place])    skin cancer arm and face  . Cataract    surgery  . Diabetes mellitus   . GERD (gastroesophageal reflux disease)   . Glaucoma   . Hyperlipidemia    no meds currently 01/22/20  . Hypertension   . Personal history of colonic adenoma 06/12/2008  . Thyroid disease    young adult-no meds now    Family History  Problem Relation Age of Onset  . Hypertension Father        family hx  . Diabetes Mother   . Kidney failure Mother   . Heart disease Mother   . Stroke Sister   . Other Sister        Leg  Disease  . Heart disease Son   . Colon cancer Neg Hx   . Esophageal cancer Neg Hx   . Rectal cancer Neg Hx   . Stomach cancer Neg Hx     Past Surgical History:  Procedure Laterality Date  . Bladder Tact    . CATARACT EXTRACTION  09-07-2012   rt eye  . CATARACT EXTRACTION Right 08/2012  . CHOLECYSTECTOMY    . COLONOSCOPY    . SKIN CANCER EXCISION    . TONSILLECTOMY AND ADENOIDECTOMY    . TUBAL LIGATION     Social History   Occupational History  . Occupation: housewife  Tobacco Use  . Smoking status: Current Every Day Smoker    Packs/day: 0.50    Years: 60.00    Pack years: 30.00    Types: Cigarettes  . Smokeless tobacco: Never Used  Vaping Use  . Vaping Use: Never used  Substance and Sexual Activity  . Alcohol use: No  . Drug use: No  . Sexual activity: Not on file

## 2020-05-07 DIAGNOSIS — H43813 Vitreous degeneration, bilateral: Secondary | ICD-10-CM | POA: Diagnosis not present

## 2020-05-07 DIAGNOSIS — H353213 Exudative age-related macular degeneration, right eye, with inactive scar: Secondary | ICD-10-CM | POA: Diagnosis not present

## 2020-05-07 DIAGNOSIS — H35371 Puckering of macula, right eye: Secondary | ICD-10-CM | POA: Diagnosis not present

## 2020-05-07 DIAGNOSIS — H353221 Exudative age-related macular degeneration, left eye, with active choroidal neovascularization: Secondary | ICD-10-CM | POA: Diagnosis not present

## 2020-05-14 ENCOUNTER — Telehealth: Payer: Self-pay | Admitting: *Deleted

## 2020-05-14 NOTE — Telephone Encounter (Signed)
° °  [facility] Pre-operative Risk Assessment      Request for surgical clearance:  1. What type of surgery is being performed?  L4-5 DECOMPRESSION , L5-S1 MICRODISCECTOMY- GENERAL ANESTHESIA  2. When is this surgery scheduled?  TBD  3. What type of clearance is required (medical clearance vs. Pharmacy clearance to hold med vs. Both)? MEDICAL  4. Are there any medications that need to be held prior to surgery and how long?ASPIRIN   5. Practice name and name of physician performing surgery? [facility];DR MARK YATES  6. What is the office phone number? [phone]   7.   What is the office fax number? [phone]  8.   Anesthesia type (None, local, MAC, general) ? GENERAL    Tracy Hammond 05/14/2020, 5:42 PM  _________________________________________________________________   (provider comments below)

## 2020-05-15 NOTE — Telephone Encounter (Signed)
   Primary Cardiologist:Bridgette Harrell Gave, MD  Chart reviewed as part of pre-operative protocol coverage. Because of Yekaterina Escutia Fehring's past medical history and time since last visit, they will require a follow-up visit in order to better assess preoperative cardiovascular risk.  Pre-op covering staff: - Please schedule appointment and call patient to inform them. If patient already had an upcoming appointment within acceptable timeframe, please add "pre-op clearance" to the appointment notes so provider is aware. - Please contact requesting surgeon's office via preferred method (i.e, phone, fax) to inform them of need for appointment prior to surgery.  If applicable, this message will also be routed to pharmacy pool and/or primary cardiologist for input on holding anticoagulant/antiplatelet agent as requested below so that this information is available to the clearing provider at time of patient's appointment.   Reino Bellis, NP  05/15/2020, 4:45 PM

## 2020-05-16 NOTE — Telephone Encounter (Signed)
Pt is scheduled to see Hoe Meng, PA-C 05/20/2020 and clearance will be addressed at that time. Will route back to the requesting surgeon's office to make them aware.  Pt was grateful for the call.

## 2020-05-20 ENCOUNTER — Ambulatory Visit: Payer: Medicare Other | Admitting: Physician Assistant

## 2020-05-20 ENCOUNTER — Encounter: Payer: Self-pay | Admitting: Physician Assistant

## 2020-05-20 ENCOUNTER — Other Ambulatory Visit: Payer: Self-pay

## 2020-05-20 VITALS — BP 148/62 | HR 78 | Ht 60.0 in | Wt 139.8 lb

## 2020-05-20 DIAGNOSIS — I10 Essential (primary) hypertension: Secondary | ICD-10-CM

## 2020-05-20 DIAGNOSIS — E785 Hyperlipidemia, unspecified: Secondary | ICD-10-CM | POA: Diagnosis not present

## 2020-05-20 DIAGNOSIS — Z01818 Encounter for other preprocedural examination: Secondary | ICD-10-CM

## 2020-05-20 DIAGNOSIS — E119 Type 2 diabetes mellitus without complications: Secondary | ICD-10-CM

## 2020-05-20 DIAGNOSIS — Z720 Tobacco use: Secondary | ICD-10-CM

## 2020-05-20 NOTE — Progress Notes (Signed)
Cardiology Office Note:    Date:  05/21/2020   ID:  Tracy, Hammond 1940/12/25, MRN [id number]  PCP:  Hoyt Koch, MD  [facility] Cardiologist:  Buford Dresser, MD  [facility]rophysiologist:  None   Referring MD: Hoyt Koch, *   Chief Complaint  Patient presents with  . New Patient (Initial Visit)    History of Present Illness:    Tracy Hammond is a 79 y.o. female with a hx of hypertension, hyperlipidemia, DM 2, history of varicose vein and tobacco abuse.  Patient was previously seen by Dr. Buford Dresser on 04/04/2018 for evaluation of leg swelling and a BNP of 171.  Echocardiogram obtained on 04/07/2018 showed EF 60 to 65%, grade 1 DD, mild MR.  Patient was instructed to follow-up with cardiology service on as-needed basis.  She has upcoming lumbar decompression surgery and microdiscectomy surgery by Dr. Lorin Mercy under general anesthesia.  Talking with the patient, unfortunately she continued to smoke at this time.  She has been smoking since age 79.  She smoked less than a pack per day.  We emphasized repeatedly that she need to stop smoking.  Her lumbar nerve compression is sending pain down her left leg, however she is still able to do every day activity without much issue.  She is not very active however able to walk around and even walk up and down the 9 steps of flight at the back of her daughter's house.  Her EKG today shows sinus rhythm without significant ST-T wave changes.  Given the reassuring echocardiogram previously and no prior history of CAD, she is cleared to to proceed with back surgery.  Otherwise, her blood pressure is still elevated today.  She attributed that to rushing to the cardiology office and getting lost on the way.  I decided to hold off on adjusting her blood pressure medication, however she will need to see her PCP for better BP control.  She can follow-up with cardiology service on a as needed basis.   Past  Medical History:  Diagnosis Date  . Allergy    seasonal  . Arthritis    fingers, back  . Bursitis   . Cancer ([place])    skin cancer arm and face  . Cataract    surgery  . Diabetes mellitus   . GERD (gastroesophageal reflux disease)   . Glaucoma   . Hyperlipidemia    no meds currently 01/22/20  . Hypertension   . Personal history of colonic adenoma 06/12/2008  . Thyroid disease    young adult-no meds now    Past Surgical History:  Procedure Laterality Date  . Bladder Tact    . CATARACT EXTRACTION  09-07-2012   rt eye  . CATARACT EXTRACTION Right 08/2012  . CHOLECYSTECTOMY    . COLONOSCOPY    . SKIN CANCER EXCISION    . TONSILLECTOMY AND ADENOIDECTOMY    . TUBAL LIGATION      Current Medications: Current Meds  Medication Sig  . aspirin 81 MG tablet Take 1 tablet (81 mg total) by mouth every morning. STOP TODAY 11/25, RESTART 08/08/2013  . brimonidine (ALPHAGAN) 0.15 % ophthalmic solution   . brimonidine (ALPHAGAN) 0.2 % ophthalmic solution Place 1 drop into the right eye 3 (three) times daily.  . Calcium Carbonate-Vit D-Min (CALCIUM 1200) 1200-1000 MG-UNIT CHEW Chew 1 tablet by mouth daily as needed. For heartburn  . Cyanocobalamin (VITAMELTS ENERGY VITAMIN B-12 PO) Take by mouth.  . diclofenac sodium (  VOLTAREN) 1 % GEL APPLY A THIN LAYER TO AFFECTED AREA UP TO 4 TIMES A DAY AS NEEDED  . diphenoxylate-atropine (LOMOTIL) 2.5-0.025 MG tablet Take 1 tablet by mouth 4 (four) times daily as needed for diarrhea or loose stools.  . dorzolamide (TRUSOPT) 2 % ophthalmic solution   . dorzolamide-timolol (COSOPT) 22.3-6.8 MG/ML ophthalmic solution Place 1 drop into both eyes 2 times daily.  Marland Kitchen gabapentin (NEURONTIN) 100 MG capsule Take 1 capsule (100 mg total) by mouth 2 (two) times daily.  Marland Kitchen glipiZIDE (GLUCOTROL) 5 MG tablet TAKE 1 TABLET BY MOUTH BEFORE BREAKFAST  . lisinopril-hydrochlorothiazide (ZESTORETIC) 20-12.5 MG tablet Take 1 tablet by mouth once daily  . MAGNESIUM PO Take 500  mg by mouth.  . metFORMIN (GLUCOPHAGE) 1000 MG tablet Take 1 tablet (1,000 mg total) by mouth 2 (two) times daily with a meal.  . mirtazapine (REMERON) 15 MG tablet Take 1 tablet (15 mg total) by mouth at bedtime.  . Multiple Vitamin (MULTIVITAMIN WITH MINERALS) TABS Take 1 tablet by mouth every morning.  Marland Kitchen rOPINIRole (REQUIP) 2 MG tablet Take 1 tablet (2 mg total) by mouth daily.  . timolol (TIMOPTIC) 0.5 % ophthalmic solution   . tiZANidine (ZANAFLEX) 2 MG tablet TAKE 1 TABLET BY MOUTH ONCE DAILY AT BEDTIME  . tobramycin (TOBREX) 0.3 % ophthalmic solution Place 1 drop into the right eye every 4 (four) hours as needed.      Allergies:   Patient has no known allergies.   Social History   Socioeconomic History  . Marital status: Married    Spouse name: Not on file  . Number of children: 3  . Years of education: Not on file  . Highest education level: Not on file  Occupational History  . Occupation: housewife  Tobacco Use  . Smoking status: Current Every Day Smoker    Packs/day: 0.50    Years: 60.00    Pack years: 30.00    Types: Cigarettes  . Smokeless tobacco: Never Used  Vaping Use  . Vaping Use: Never used  Substance and Sexual Activity  . Alcohol use: No  . Drug use: No  . Sexual activity: Not on file  Other Topics Concern  . Not on file  Social History Narrative   The patient is married. 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 and 2 daughters   3 caffeinated beverages daily   06/19/2013         Social Determinants of Health   Financial Resource Strain:   . Difficulty of Paying Living Expenses: Not on file  Food Insecurity:   . Worried About Charity fundraiser in the Last Year: Not on file  . Ran Out of Food in the Last Year: Not on file  Transportation Needs:   . Lack of Transportation (Medical): Not on file  . Lack of Transportation (Non-Medical): Not on file  Physical Activity:   . Days of Exercise per Week: Not on file  . Minutes of Exercise per Session: Not on  file  Stress:   . Feeling of Stress : Not on file  Social Connections:   . Frequency of Communication with Friends and Family: Not on file  . Frequency of Social Gatherings with Friends and Family: Not on file  . Attends Religious Services: Not on file  . Active Member of Clubs or Organizations: Not on file  . Attends Archivist Meetings: Not on file  . Marital Status: Not on file     Family History:  The patient's family history includes Diabetes in her mother; Heart disease in her mother and son; Hypertension in her father; Kidney failure in her mother; Other in her sister; Stroke in her sister. There is no history of Colon cancer, Esophageal cancer, Rectal cancer, or Stomach cancer.  ROS:   Please see the history of present illness.     All other systems reviewed and are negative.  EKGs/Labs/Other Studies Reviewed:    The following studies were reviewed today:  Echo 04/07/2018 LV EF: 60% -  65%   -------------------------------------------------------------------  History:  PMH: Lower extremity edema. Risk factors:  Hypertension. Diabetes mellitus. Dyslipidemia.   -------------------------------------------------------------------  Study Conclusions   - Left ventricle: The cavity size was normal. Wall thickness was  normal. Systolic function was normal. The estimated ejection  fraction was in the range of 60% to 65%. Wall motion was normal;  there were no regional wall motion abnormalities. Doppler  parameters are consistent with abnormal left ventricular  relaxation (grade 1 diastolic dysfunction). Doppler parameters  are consistent with high ventricular filling pressure.  - Aortic valve: Valve area (Vmax): 2.13 cm^2.  - Mitral valve: Moderately calcified annulus. The findings are  consistent with mild non rheumatic stenosis. There was mild  regurgitation. Valve area by pressure half-time: 1.9 cm^2. Valve area by continuity equation (using  LVOT flow): 1.71 cm^2.    EKG:  EKG is ordered today.  The ekg ordered today demonstrates normal sinus rhythm, no significant ST-T wave changes  Recent Labs: 09/20/2019: ALT 49; BUN 21; Creatinine, Ser 0.61; Hemoglobin 14.5; Platelets 231.0; Potassium 4.0; Sodium 138  Recent Lipid Panel    Component Value Date/Time   CHOL 135 09/20/2019 0858   TRIG 139.0 09/20/2019 0858   HDL 40.00 09/20/2019 0858   CHOLHDL 3 09/20/2019 0858   VLDL 27.8 09/20/2019 0858   LDLCALC 68 09/20/2019 0858   LDLDIRECT 84.0 09/08/2017 0901    Physical Exam:    VS:  BP (!) 148/62 Comment: just smoked a cigarette  Pulse 78   Ht 5' (1.524 m)   Wt 139 lb 12.8 oz (63.4 kg)   SpO2 98%   BMI 27.30 kg/m     Wt Readings from Last 3 Encounters:  05/20/20 139 lb 12.8 oz (63.4 kg)  04/23/20 143 lb (64.9 kg)  03/19/20 149 lb (67.6 kg)     GEN:  Well nourished, well developed in no acute distress HEENT: Normal NECK: No JVD; No carotid bruits LYMPHATICS: No lymphadenopathy CARDIAC: RRR, no murmurs, rubs, gallops RESPIRATORY:  Clear to auscultation without rales, wheezing or rhonchi  ABDOMEN: Soft, non-tender, non-distended MUSCULOSKELETAL:  No edema; No deformity  SKIN: Warm and dry NEUROLOGIC:  Alert and oriented x 3 PSYCHIATRIC:  Normal affect   ASSESSMENT:    1. Pre-op evaluation   2. Essential hypertension   3. Hyperlipidemia LDL goal <70   4. Controlled type 2 diabetes mellitus without complication, without long-term current use of insulin ([place])   5. Tobacco abuse    PLAN:    In order of problems listed above:  1. Preoperative clearance: Patient has upcoming back surgery.  EKG shows normal sinus rhythm without significant ST-T wave changes.  Patient does not have history of CAD.  Previous echocardiogram from 2019 showed normal EF without significant valve issue.  Despite her back pain radiating down her left leg, she is still able to climb up and down to 9 steps of stairs behind her daughter's  house.  She is able to do  every day activity without exertional chest pain or shortness of breath.  She is cleared to proceed with back surgery without further work-up.  She will need to hold aspirin for 7 to 10 days prior to the surgery and restart as soon as possible afterward  2. Hypertension: Blood pressure mildly elevated today, however according to the patient, she got lost on her way to the cardiology office.  Looking back, her blood pressure has been somewhat elevated during the previous office visit as well.  I asked her to follow-up with her primary care provider to continue to focus on blood pressure management  3. Hyperlipidemia: Cholesterol seems to be quite well controlled based on the last lab work in January 2021.  4. DM2: Managed by primary care provider.  5. Tobacco abuse: Repeat emphasis has been placed on tobacco cessation.   Medication Adjustments/Labs and Tests Ordered: Current medicines are reviewed at length with the patient today.  Concerns regarding medicines are outlined above.  Orders Placed This Encounter  Procedures  . EKG 12-Lead   No orders of the defined types were placed in this encounter.   Patient Instructions  Medication Instructions:   Prior to back surgery you will need to hold your Aspirin for 7-10 days  *If you need a refill on your cardiac medications before your next appointment, please call your pharmacy*  Lab Work: NONE ordered at this time of appointment   If you have labs (blood work) drawn today and your tests are completely normal, you will receive your results only by: Marland Kitchen MyChart Message (if you have MyChart) OR . A paper copy in the mail If you have any lab test that is abnormal or we need to change your treatment, we will call you to review the results.  Testing/Procedures: NONE ordered at this time of appointment   Follow-Up: At [facility], you and your health needs are our priority.  As part of our continuing mission to  provide you with exceptional heart care, we have created designated Provider Care Teams.  These Care Teams include your primary Cardiologist (physician) and Advanced Practice Providers (APPs -  Physician Assistants and Nurse Practitioners) who all work together to provide you with the care you need, when you need it.  We recommend signing up for the patient portal called "MyChart".  Sign up information is provided on this After Visit Summary.  MyChart is used to connect with patients for Virtual Visits (Telemedicine).  Patients are able to view lab/test results, encounter notes, upcoming appointments, etc.  Non-urgent messages can be sent to your provider as well.   To learn more about what you can do with MyChart, go to [email or web address].    Your next appointment:   Follow up AS NEEDED   The format for your next appointment:   In Person  Provider:   You may see Buford Dresser, MD or one of the following Advanced Practice Providers on your designated Care Team:    Rosaria Ferries, PA-C  Jory Sims, DNP, ANP  Other Instructions      Signed, Almyra Deforest, [place]  05/21/2020 3:46 PM    [place]

## 2020-05-20 NOTE — Patient Instructions (Addendum)
Medication Instructions:   Prior to back surgery you will need to hold your Aspirin for 7-10 days  *If you need a refill on your cardiac medications before your next appointment, please call your pharmacy*  Lab Work: NONE ordered at this time of appointment   If you have labs (blood work) drawn today and your tests are completely normal, you will receive your results only by: Marland Kitchen MyChart Message (if you have MyChart) OR . A paper copy in the mail If you have any lab test that is abnormal or we need to change your treatment, we will call you to review the results.  Testing/Procedures: NONE ordered at this time of appointment   Follow-Up: At [facility], you and your health needs are our priority.  As part of our continuing mission to provide you with exceptional heart care, we have created designated Provider Care Teams.  These Care Teams include your primary Cardiologist (physician) and Advanced Practice Providers (APPs -  Physician Assistants and Nurse Practitioners) who all work together to provide you with the care you need, when you need it.  We recommend signing up for the patient portal called "MyChart".  Sign up information is provided on this After Visit Summary.  MyChart is used to connect with patients for Virtual Visits (Telemedicine).  Patients are able to view lab/test results, encounter notes, upcoming appointments, etc.  Non-urgent messages can be sent to your provider as well.   To learn more about what you can do with MyChart, go to [email or web address].    Your next appointment:   Follow up AS NEEDED   The format for your next appointment:   In Person  Provider:   You may see Buford Dresser, MD or one of the following Advanced Practice Providers on your designated Care Team:    Rosaria Ferries, PA-C  Jory Sims, DNP, ANP  Other Instructions

## 2020-05-28 ENCOUNTER — Ambulatory Visit (INDEPENDENT_AMBULATORY_CARE_PROVIDER_SITE_OTHER): Payer: Medicare Other

## 2020-05-28 ENCOUNTER — Other Ambulatory Visit: Payer: Self-pay

## 2020-05-28 DIAGNOSIS — Z23 Encounter for immunization: Secondary | ICD-10-CM

## 2020-06-07 ENCOUNTER — Ambulatory Visit: Payer: Medicare Other | Attending: Internal Medicine

## 2020-06-07 DIAGNOSIS — Z23 Encounter for immunization: Secondary | ICD-10-CM

## 2020-06-07 NOTE — Progress Notes (Signed)
   Covid-19 Vaccination Clinic  Name:  Tracy Hammond    MRN: [id number] DOB: [birth year]  06/07/2020  [name] was observed post Covid-19 immunization for 15 minutes without incident. She was provided with Vaccine Information Sheet and instruction to access the V-Safe system.   [name] was instructed to call 911 with any severe reactions post vaccine: Marland Kitchen Difficulty breathing  . Swelling of face and throat  . A fast heartbeat  . A bad rash all over body  . Dizziness and weakness

## 2020-06-17 DIAGNOSIS — H401123 Primary open-angle glaucoma, left eye, severe stage: Secondary | ICD-10-CM | POA: Diagnosis not present

## 2020-06-17 DIAGNOSIS — H401114 Primary open-angle glaucoma, right eye, indeterminate stage: Secondary | ICD-10-CM | POA: Diagnosis not present

## 2020-06-20 ENCOUNTER — Other Ambulatory Visit: Payer: Self-pay | Admitting: Internal Medicine

## 2020-07-19 ENCOUNTER — Other Ambulatory Visit: Payer: Self-pay

## 2020-07-19 ENCOUNTER — Encounter: Payer: Self-pay | Admitting: Internal Medicine

## 2020-07-19 ENCOUNTER — Ambulatory Visit (INDEPENDENT_AMBULATORY_CARE_PROVIDER_SITE_OTHER): Payer: Medicare Other | Admitting: Internal Medicine

## 2020-07-19 VITALS — BP 144/80 | HR 69 | Temp 97.8°F | Ht 60.0 in | Wt 138.0 lb

## 2020-07-19 DIAGNOSIS — I10 Essential (primary) hypertension: Secondary | ICD-10-CM | POA: Diagnosis not present

## 2020-07-19 DIAGNOSIS — E118 Type 2 diabetes mellitus with unspecified complications: Secondary | ICD-10-CM

## 2020-07-19 DIAGNOSIS — J410 Simple chronic bronchitis: Secondary | ICD-10-CM | POA: Diagnosis not present

## 2020-07-19 LAB — COMPREHENSIVE METABOLIC PANEL
ALT: 24 U/L (ref 0–35)
AST: 30 U/L (ref 0–37)
Albumin: 4 g/dL (ref 3.5–5.2)
Alkaline Phosphatase: 88 U/L (ref 39–117)
BUN: 24 mg/dL — ABNORMAL HIGH (ref 6–23)
CO2: 31 mEq/L (ref 19–32)
Calcium: 9.5 mg/dL (ref 8.4–10.5)
Chloride: 102 mEq/L (ref 96–112)
Creatinine, Ser: 0.65 mg/dL (ref 0.40–1.20)
GFR: 85.04 mL/min (ref >60.00)
Glucose, Bld: 120 mg/dL — ABNORMAL HIGH (ref 70–99)
Potassium: 3.2 mEq/L — ABNORMAL LOW (ref 3.5–5.1)
Sodium: 142 mEq/L (ref 135–145)
Total Bilirubin: 0.6 mg/dL (ref 0.2–1.2)
Total Protein: 6.7 g/dL (ref 6.0–8.3)

## 2020-07-19 LAB — HEMOGLOBIN A1C: Hgb A1c MFr Bld: 6.4 % (ref 4.6–6.5)

## 2020-07-19 LAB — LIPID PANEL
Cholesterol: 112 mg/dL (ref 0–200)
HDL: 38.9 mg/dL — ABNORMAL LOW (ref >39.00)
LDL Cholesterol: 47 mg/dL (ref 0–99)
NonHDL: 72.96
Total CHOL/HDL Ratio: 3
Triglycerides: 129 mg/dL (ref 0.0–149.0)
VLDL: 25.8 mg/dL (ref 0.0–40.0)

## 2020-07-19 MED ORDER — NYSTATIN 100000 UNIT/GM EX OINT: TOPICAL_OINTMENT | Freq: Two times a day (BID) | CUTANEOUS | 6 refills | Status: DC

## 2020-07-19 NOTE — Assessment & Plan Note (Signed)
Checking HgA1c today and adjust metformin as needed. Previously at goal.

## 2020-07-19 NOTE — Assessment & Plan Note (Signed)
Counseled to quit smoking, declines capacity to make quit attempt today.

## 2020-07-19 NOTE — Progress Notes (Signed)
   Subjective:   Patient ID: Tracy Hammond, female    DOB: 06-06-1941, 79 y.o.   MRN: [id number]  HPI The patient is a 79 YO female coming in for pre-op clearance for lumbar back surgery. She has seen cardiology and they have cleared her without need for further screening. Denies current chest pains or SOB. Exercise capacity is limited due to pain. She is having low back pain and left radiculopathy and numbness. Is current smoker 1/2 PPD or less without intention to quit. Diabetic and last HgA1c at goal 7.2.  PMH, [facility], social history reviewed and updated  Review of Systems  Constitutional: Positive for activity change. Negative for appetite change.  HENT: Negative.   Eyes: Negative.   Respiratory: Negative for cough, chest tightness and shortness of breath.   Cardiovascular: Negative for chest pain, palpitations and leg swelling.  Gastrointestinal: Negative for abdominal distention, abdominal pain, constipation, diarrhea, nausea and vomiting.  Musculoskeletal: Positive for back pain and gait problem.  Skin: Negative.   Neurological: Positive for numbness.  Psychiatric/Behavioral: Negative.     Objective:  Physical Exam Constitutional:      Appearance: She is well-developed.  HENT:     Head: Normocephalic and atraumatic.  Cardiovascular:     Rate and Rhythm: Normal rate and regular rhythm.  Pulmonary:     Effort: Pulmonary effort is normal. No respiratory distress.     Breath sounds: Normal breath sounds. No wheezing or rales.  Abdominal:     General: Bowel sounds are normal. There is no distension.     Palpations: Abdomen is soft.     Tenderness: There is no abdominal tenderness. There is no rebound.  Musculoskeletal:        General: Tenderness present.     Cervical back: Normal range of motion.  Skin:    General: Skin is warm and dry.  Neurological:     Mental Status: She is alert and oriented to person, place, and time.     Coordination: Coordination abnormal.      Comments: Slow gait but steady     Vitals:   07/19/20 0846  BP: (!) 144/80  Pulse: 69  Temp: 97.8 F (36.6 C)  TempSrc: Oral  SpO2: 96%  Weight: 138 lb (62.6 kg)  Height: 5' (1.524 m)    This visit occurred during the SARS-CoV-2 public health emergency.  Safety protocols were in place, including screening questions prior to the visit, additional usage of staff PPE, and extensive cleaning of exam room while observing appropriate contact time as indicated for disinfecting solutions.   Assessment & Plan:  Visit time 15 minutes in face to face communication with patient and coordination of care, additional 5 minutes spent in record review, coordination or care, ordering tests, communicating/referring to other healthcare professionals, documenting in medical records all on the same day of the visit for total time 20 minutes spent on the visit.

## 2020-07-19 NOTE — Assessment & Plan Note (Signed)
Acceptable BP for surgery and recent EKG without changes.

## 2020-07-22 ENCOUNTER — Other Ambulatory Visit: Payer: Self-pay | Admitting: Internal Medicine

## 2020-07-22 MED ORDER — POTASSIUM CHLORIDE CRYS ER 20 MEQ PO TBCR: 20.0000 meq | EXTENDED_RELEASE_TABLET | Freq: Every day | ORAL | 0 refills | Status: DC

## 2020-07-30 DIAGNOSIS — H353221 Exudative age-related macular degeneration, left eye, with active choroidal neovascularization: Secondary | ICD-10-CM | POA: Diagnosis not present

## 2020-07-30 DIAGNOSIS — H353213 Exudative age-related macular degeneration, right eye, with inactive scar: Secondary | ICD-10-CM | POA: Diagnosis not present

## 2020-07-30 DIAGNOSIS — H35423 Microcystoid degeneration of retina, bilateral: Secondary | ICD-10-CM | POA: Diagnosis not present

## 2020-07-30 DIAGNOSIS — H35371 Puckering of macula, right eye: Secondary | ICD-10-CM | POA: Diagnosis not present

## 2020-08-06 ENCOUNTER — Other Ambulatory Visit: Payer: Self-pay

## 2020-08-07 ENCOUNTER — Ambulatory Visit (INDEPENDENT_AMBULATORY_CARE_PROVIDER_SITE_OTHER): Payer: Medicare Other | Admitting: Surgery

## 2020-08-07 ENCOUNTER — Encounter: Payer: Self-pay | Admitting: Surgery

## 2020-08-07 VITALS — BP 193/72 | HR 71 | Ht 60.0 in | Wt 138.0 lb

## 2020-08-07 DIAGNOSIS — M4807 Spinal stenosis, lumbosacral region: Secondary | ICD-10-CM

## 2020-08-07 NOTE — Pre-Procedure Instructions (Signed)
Tracy Hammond  08/07/2020      [facility] [place], [place] Tracy Hammond [place] [address] Phone: [phone] Fax: [phone]    Your procedure is scheduled on Dec. 13  Report to [facility] Entrance A at 10:30 A.M.  Call this number if you have problems the morning of surgery:  [phone]   Remember:  Do not eat or drink after midnight.     Take these medicines the morning of surgery with A SIP OF WATER :                       Gabapentin (neurontin)             Lansoprazole (prevacid)             ropiniole (requip)             Eye drops              7 days prior to surgery STOP taking any Aspirin (unless otherwise instructed by your surgeon), Aleve, Naproxen, Ibuprofen, Motrin, Advil, Goody's, BC's, all herbal medications, fish oil, and all vitamins.           Follow your surgeon's instructions on when to stop Aspirin.  If no instructions were given by your surgeon then you will need to call the office to get those instructions.                            How to Manage Your Diabetes Before and After Surgery  Why is it important to control my blood sugar before and after surgery? . Improving blood sugar levels before and after surgery helps healing and can limit problems. . A way of improving blood sugar control is eating a healthy diet by: o  Eating less sugar and carbohydrates o  Increasing activity/exercise o  Talking with your doctor about reaching your blood sugar goals . High blood sugars (greater than 180 mg/dL) can raise your risk of infections and slow your recovery, so you will need to focus on controlling your diabetes during the weeks before surgery. . Make sure that the doctor who takes care of your diabetes knows about your planned surgery including the date and location.  How do I manage my blood sugar before surgery? . Check your blood sugar at least 4 times a day, starting 2 days before surgery, to  make sure that the level is not too high or low. o Check your blood sugar the morning of your surgery when you wake up and every 2 hours until you get to the Short Stay unit. . If your blood sugar is less than 70 mg/dL, you will need to treat for low blood sugar: o Do not take insulin. o Treat a low blood sugar (less than 70 mg/dL) with  cup of clear juice (cranberry or apple), 4 glucose tablets, OR glucose gel. Recheck blood sugar in 15 minutes after treatment (to make sure it is greater than 70 mg/dL). If your blood sugar is not greater than 70 mg/dL on recheck, call [phone] o  for further instructions. . Report your blood sugar to the short stay nurse when you get to Short Stay.  . If you are admitted to the hospital after surgery: o Your blood sugar will be checked by the staff and you will probably be given insulin after surgery (instead  of oral diabetes medicines) to make sure you have good blood sugar levels. o The goal for blood sugar control after surgery is 80-180 mg/dL.       WHAT DO I DO ABOUT MY DIABETES MEDICATION?  Marland Kitchen Do not take oral diabetes medicines (pills) the morning of surgery. (metformin/glucophage, glipizide/glucotrol)    Do not wear jewelry, make-up or nail polish.  Do not wear lotions, powders, or perfumes, or deodorant.  Do not shave 48 hours prior to surgery.  Men may shave face and neck.  Do not bring valuables to the hospital.  [facility] is not responsible for any belongings or valuables.  Contacts, dentures or bridgework may not be worn into surgery.  Leave your suitcase in the car.  After surgery it may be brought to your room.  For patients admitted to the hospital, discharge time will be determined by your treatment team.  Patients discharged the day of surgery will not be allowed to drive home.    Special instructions:  [facility]- Preparing For Surgery  Before surgery, you can play an important role. Because skin is not sterile, your  skin needs to be as free of germs as possible. You can reduce the number of germs on your skin by washing with CHG (chlorahexidine gluconate) Soap before surgery.  CHG is an antiseptic cleaner which kills germs and bonds with the skin to continue killing germs even after washing.    Oral Hygiene is also important to reduce your risk of infection.  Remember - BRUSH YOUR TEETH THE MORNING OF SURGERY WITH YOUR REGULAR TOOTHPASTE  Please do not use if you have an allergy to CHG or antibacterial soaps. If your skin becomes reddened/irritated stop using the CHG.  Do not shave (including legs and underarms) for at least 48 hours prior to first CHG shower. It is OK to shave your face.  Please follow these instructions carefully.   1. Shower the NIGHT BEFORE SURGERY and the MORNING OF SURGERY with CHG.   2. If you chose to wash your hair, wash your hair first as usual with your normal shampoo.  3. After you shampoo, rinse your hair and body thoroughly to remove the shampoo.  4. Use CHG as you would any other liquid soap. You can apply CHG directly to the skin and wash gently with a scrungie or a clean washcloth.   5. Apply the CHG Soap to your body ONLY FROM THE NECK DOWN.  Do not use on open wounds or open sores. Avoid contact with your eyes, ears, mouth and genitals (private parts). Wash Face and genitals (private parts)  with your normal soap.  6. Wash thoroughly, paying special attention to the area where your surgery will be performed.  7. Thoroughly rinse your body with warm water from the neck down.  8. DO NOT shower/wash with your normal soap after using and rinsing off the CHG Soap.  9. Pat yourself dry with a CLEAN TOWEL.  10. Wear CLEAN PAJAMAS to bed the night before surgery, wear comfortable clothes the morning of surgery  11. Place CLEAN SHEETS on your bed the night of your first shower and DO NOT SLEEP WITH PETS.    Day of Surgery:  Do not apply any deodorants/lotions.   Please wear clean clothes to the hospital/surgery center.   Remember to brush your teeth WITH YOUR REGULAR TOOTHPASTE.    Please read over the following fact sheets that you were given.

## 2020-08-07 NOTE — Progress Notes (Signed)
79 year old white female history of L4-5 and L5-S1 HNP/stenosis comes in for preop evaluation.  She continues have ongoing low back pain and left lower extremity radiculopathy.  She is want to proceed with L4-5 decompression and L5-S1 microdiscectomy as scheduled.  Today history and physical performed.  Patient admits to a chronic cough.  Also history of diarrhea.  No bloody stools.  Surgical procedure discussed all potential recovery time.  All questions answered and she wishes to proceed.

## 2020-08-08 ENCOUNTER — Other Ambulatory Visit (HOSPITAL_COMMUNITY)
Admission: RE | Admit: 2020-08-08 | Discharge: 2020-08-08 | Disposition: A | Discharge status: Home / Self Care | Payer: Medicare Other | Source: Ambulatory Visit | Attending: Orthopaedic Surgery | Admitting: Orthopaedic Surgery

## 2020-08-08 ENCOUNTER — Other Ambulatory Visit: Payer: Self-pay

## 2020-08-08 ENCOUNTER — Encounter (HOSPITAL_COMMUNITY): Payer: Self-pay

## 2020-08-08 ENCOUNTER — Encounter (HOSPITAL_COMMUNITY)
Admission: RE | Admit: 2020-08-08 | Discharge: 2020-08-08 | Disposition: A | Discharge status: Home / Self Care | Payer: Medicare Other | Source: Ambulatory Visit | Attending: Orthopaedic Surgery | Admitting: Orthopaedic Surgery

## 2020-08-08 DIAGNOSIS — Z7982 Long term (current) use of aspirin: Secondary | ICD-10-CM | POA: Insufficient documentation

## 2020-08-08 DIAGNOSIS — Z01812 Encounter for preprocedural laboratory examination: Secondary | ICD-10-CM | POA: Insufficient documentation

## 2020-08-08 DIAGNOSIS — Z20822 Contact with and (suspected) exposure to covid-19: Secondary | ICD-10-CM | POA: Insufficient documentation

## 2020-08-08 DIAGNOSIS — E785 Hyperlipidemia, unspecified: Secondary | ICD-10-CM | POA: Insufficient documentation

## 2020-08-08 DIAGNOSIS — E119 Type 2 diabetes mellitus without complications: Secondary | ICD-10-CM | POA: Insufficient documentation

## 2020-08-08 DIAGNOSIS — I10 Essential (primary) hypertension: Secondary | ICD-10-CM | POA: Insufficient documentation

## 2020-08-08 DIAGNOSIS — I2510 Atherosclerotic heart disease of native coronary artery without angina pectoris: Secondary | ICD-10-CM | POA: Insufficient documentation

## 2020-08-08 HISTORY — DX: Restless legs syndrome: G25.81

## 2020-08-08 HISTORY — DX: Cough, unspecified: R05.9

## 2020-08-08 LAB — BASIC METABOLIC PANEL
Anion gap: 11 (ref 5–15)
BUN: 14 mg/dL (ref 8–23)
CO2: 29 mmol/L (ref 22–32)
Calcium: 9.7 mg/dL (ref 8.9–10.3)
Chloride: 99 mmol/L (ref 98–111)
Creatinine, Ser: 0.64 mg/dL (ref 0.44–1.00)
GFR, Estimated: >60 mL/min (ref >60)
Glucose, Bld: 102 mg/dL — ABNORMAL HIGH (ref 70–99)
Potassium: 3.2 mmol/L — ABNORMAL LOW (ref 3.5–5.1)
Sodium: 139 mmol/L (ref 135–145)

## 2020-08-08 LAB — CBC
HCT: 42.4 % (ref 36.0–46.0)
Hemoglobin: 14 g/dL (ref 12.0–15.0)
MCH: 31.6 pg (ref 26.0–34.0)
MCHC: 33 g/dL (ref 30.0–36.0)
MCV: 95.7 fL (ref 80.0–100.0)
Platelets: 251 10*3/uL (ref 150–400)
RBC: 4.43 MIL/uL (ref 3.87–5.11)
RDW: 13.2 % (ref 11.5–15.5)
WBC: 8.5 10*3/uL (ref 4.0–10.5)
nRBC: 0 % (ref 0.0–0.2)

## 2020-08-08 LAB — SURGICAL PCR SCREEN
MRSA, PCR: NEGATIVE
Staphylococcus aureus: NEGATIVE

## 2020-08-08 LAB — SARS CORONAVIRUS 2 (TAT 6-24 HRS): SARS Coronavirus 2: NEGATIVE

## 2020-08-08 LAB — GLUCOSE, CAPILLARY: Glucose-Capillary: 112 mg/dL — ABNORMAL HIGH (ref 70–99)

## 2020-08-08 NOTE — Progress Notes (Signed)
PCP - E. CRAWFORD Cardiologist - DR CHRISTOPHER      CLEARANCE DONE    Chest x-ray - NA EKG - 9/21 Stress Test - NA ECHO - 8/19 Cardiac Cath - NA  Sleep Study - NA Fasting Blood Sugar - 125 Checks Blood Sugar __3___ times a  WEEK  Blood Thinner Instructions:NA Aspirin Instructions:STOP   COVID TEST- FOR TODAY   Anesthesia review: HTN  Patient denies shortness of breath, fever, cough and chest pain at PAT appointment   All instructions explained to the patient, with a verbal understanding of the material. Patient agrees to go over the instructions while at home for a better understanding. Patient also instructed to self quarantine after being tested for COVID-19. The opportunity to ask questions was provided.

## 2020-08-09 NOTE — Progress Notes (Signed)
Anesthesia Chart Review:  Follows with cardiology for hx of HTN, HLD, risk factor management. Seen 05/20/20 and cleared per note, "Preoperative clearance: Patient has upcoming back surgery.  EKG shows normal sinus rhythm without significant ST-T wave changes.  Patient does not have history of CAD.  Previous echocardiogram from 2019 showed normal EF without significant valve issue.  Despite her back pain radiating down her left leg, she is still able to climb up and down to 9 steps of stairs behind her daughter's house.  She is able to do every day activity without exertional chest pain or shortness of breath.  She is cleared to proceed with back surgery without further work-up.  She will need to hold aspirin for 7 to 10 days prior to the surgery and restart as soon as possible afterward."  DMII well controlled, A1c 6.4 on 07/19/20.   Preop labs reviewed, mild hypokalemia with K+ 3.2. Labs otherwise unremarkable.  EKG 05/20/20: NSR. Rat 69. Nonspecific ST abnormality.   TTE 04/07/18: - Left ventricle: The cavity size was normal. Wall thickness was  normal. Systolic function was normal. The estimated ejection  fraction was in the range of 60% to 65%. Wall motion was normal;  there were no regional wall motion abnormalities. Doppler  parameters are consistent with abnormal left ventricular  relaxation (grade 1 diastolic dysfunction). Doppler parameters  are consistent with high ventricular filling pressure.  - Aortic valve: Valve area (Vmax): 2.13 cm^2.  - Mitral valve: Moderately calcified annulus. The findings are  consistent with mild non rheumatic stenosis. There was mild  regurgitation. Valve area by pressure half-time: 1.9 cm^2. Valve  area by continuity equation (using LVOT flow): 1.71 cm^2.    Wynonia Musty [facility] Short Stay Center/Anesthesiology Phone [phone] 08/09/2020 9:49 AM

## 2020-08-09 NOTE — Anesthesia Preprocedure Evaluation (Addendum)
Anesthesia Evaluation  Patient identified by MRN, date of birth, ID band Patient awake    Airway Mallampati: II  TM Distance: >3 FB Neck ROM: Full    Dental  (+) Teeth Intact   Pulmonary neg pulmonary ROS, Current Smoker and Patient abstained from smoking.,    Pulmonary exam normal        Cardiovascular hypertension, Pt. on medications  Rhythm:Regular Rate:Normal     Neuro/Psych negative neurological ROS  negative psych ROS   GI/Hepatic Neg liver ROS, GERD  Medicated,  Endo/Other  diabetes, Well Controlled, Type 2, Oral Hypoglycemic Agents  Renal/GU negative Renal ROS  negative genitourinary   Musculoskeletal  (+) Arthritis , Osteoarthritis,    Abdominal (+)  Abdomen: soft. Bowel sounds: normal.  Peds  Hematology negative hematology ROS (+)   Anesthesia Other Findings   Reproductive/Obstetrics                            Anesthesia Physical Anesthesia Plan  ASA: II  Anesthesia Plan: General   Post-op Pain Management:    Induction: Intravenous  PONV Risk Score and Plan: 2 and Ondansetron, Dexamethasone and Treatment may vary due to age or medical condition  Airway Management Planned: Mask and Oral ETT  Additional Equipment: None  Intra-op Plan:   Post-operative Plan: Extubation in OR  Informed Consent: I have reviewed the patients History and Physical, chart, labs and discussed the procedure including the risks, benefits and alternatives for the proposed anesthesia with the patient or authorized representative who has indicated his/her understanding and acceptance.     Dental advisory given  Plan Discussed with: CRNA  Anesthesia Plan Comments: (PAT note by Karoline Caldwell, PA-C: Follows with cardiology for hx of HTN, HLD, risk factor management. Seen 05/20/20 and cleared per note, "Preoperative clearance: Patient has upcoming back surgery.  EKG shows normal sinus rhythm without  significant ST-T wave changes.  Patient does not have history of CAD.  Previous echocardiogram from 2019 showed normal EF without significant valve issue.  Despite her back pain radiating down her left leg, she is still able to climb up and down to 9 steps of stairs behind her daughter's house.  She is able to do every day activity without exertional chest pain or shortness of breath.  She is cleared to proceed with back surgery without further work-up.  She will need to hold aspirin for 7 to 10 days prior to the surgery and restart as soon as possible afterward."  DMII well controlled, A1c 6.4 on 07/19/20.   Preop labs reviewed, mild hypokalemia with K+ 3.2. Labs otherwise unremarkable.  EKG 05/20/20: NSR. Rat 69. Nonspecific ST abnormality.   TTE 04/07/18: - Left ventricle: The cavity size was normal. Wall thickness was  normal. Systolic function was normal. The estimated ejection  fraction was in the range of 60% to 65%. Wall motion was normal;  there were no regional wall motion abnormalities. Doppler  parameters are consistent with abnormal left ventricular  relaxation (grade 1 diastolic dysfunction). Doppler parameters  are consistent with high ventricular filling pressure.  - Aortic valve: Valve area (Vmax): 2.13 cm^2.  - Mitral valve: Moderately calcified annulus. The findings are  consistent with mild non rheumatic stenosis. There was mild  regurgitation. Valve area by pressure half-time: 1.9 cm^2. Valve  area by continuity equation (using LVOT flow): 1.71 cm^2.  )       Anesthesia Quick Evaluation

## 2020-08-12 ENCOUNTER — Ambulatory Visit (HOSPITAL_COMMUNITY): Payer: Medicare Other | Admitting: Anesthesiology

## 2020-08-12 ENCOUNTER — Ambulatory Visit (HOSPITAL_COMMUNITY): Payer: Medicare Other

## 2020-08-12 ENCOUNTER — Ambulatory Visit (HOSPITAL_COMMUNITY): Payer: Medicare Other | Admitting: Physician Assistant

## 2020-08-12 ENCOUNTER — Encounter (HOSPITAL_COMMUNITY)
Admission: RE | Disposition: A | Discharge status: Home / Self Care | Payer: Self-pay | Source: Home / Self Care | Attending: Orthopaedic Surgery

## 2020-08-12 ENCOUNTER — Encounter (HOSPITAL_COMMUNITY): Payer: Self-pay | Admitting: Orthopaedic Surgery

## 2020-08-12 ENCOUNTER — Observation Stay (HOSPITAL_COMMUNITY)
Admission: RE | Admit: 2020-08-12 | Discharge: 2020-08-13 | Disposition: A | Discharge status: Home / Self Care | Payer: Medicare Other | Attending: Orthopaedic Surgery | Admitting: Orthopaedic Surgery

## 2020-08-12 ENCOUNTER — Other Ambulatory Visit: Payer: Self-pay

## 2020-08-12 DIAGNOSIS — M4807 Spinal stenosis, lumbosacral region: Secondary | ICD-10-CM | POA: Diagnosis not present

## 2020-08-12 DIAGNOSIS — M48062 Spinal stenosis, lumbar region with neurogenic claudication: Secondary | ICD-10-CM

## 2020-08-12 DIAGNOSIS — Z85828 Personal history of other malignant neoplasm of skin: Secondary | ICD-10-CM | POA: Diagnosis not present

## 2020-08-12 DIAGNOSIS — Z981 Arthrodesis status: Secondary | ICD-10-CM | POA: Diagnosis not present

## 2020-08-12 DIAGNOSIS — Z79899 Other long term (current) drug therapy: Secondary | ICD-10-CM | POA: Diagnosis not present

## 2020-08-12 DIAGNOSIS — M5459 Other low back pain: Secondary | ICD-10-CM | POA: Diagnosis present

## 2020-08-12 DIAGNOSIS — M5137 Other intervertebral disc degeneration, lumbosacral region: Secondary | ICD-10-CM | POA: Diagnosis not present

## 2020-08-12 DIAGNOSIS — F17210 Nicotine dependence, cigarettes, uncomplicated: Secondary | ICD-10-CM | POA: Diagnosis not present

## 2020-08-12 DIAGNOSIS — E119 Type 2 diabetes mellitus without complications: Secondary | ICD-10-CM | POA: Diagnosis not present

## 2020-08-12 DIAGNOSIS — M5126 Other intervertebral disc displacement, lumbar region: Secondary | ICD-10-CM

## 2020-08-12 DIAGNOSIS — M48061 Spinal stenosis, lumbar region without neurogenic claudication: Principal | ICD-10-CM | POA: Diagnosis present

## 2020-08-12 DIAGNOSIS — Z419 Encounter for procedure for purposes other than remedying health state, unspecified: Secondary | ICD-10-CM

## 2020-08-12 DIAGNOSIS — I10 Essential (primary) hypertension: Secondary | ICD-10-CM | POA: Insufficient documentation

## 2020-08-12 HISTORY — PX: LUMBAR LAMINECTOMY/DECOMPRESSION MICRODISCECTOMY: SHX5026

## 2020-08-12 LAB — GLUCOSE, CAPILLARY
Glucose-Capillary: 142 mg/dL — ABNORMAL HIGH (ref 70–99)
Glucose-Capillary: 119 mg/dL — ABNORMAL HIGH (ref 70–99)
Glucose-Capillary: 177 mg/dL — ABNORMAL HIGH (ref 70–99)
Glucose-Capillary: 214 mg/dL — ABNORMAL HIGH (ref 70–99)

## 2020-08-12 SURGERY — LUMBAR LAMINECTOMY/DECOMPRESSION MICRODISCECTOMY
Anesthesia: General | Site: Spine Lumbar

## 2020-08-12 MED ORDER — METHOCARBAMOL 500 MG PO TABS
500.0000 mg | ORAL_TABLET | Freq: Four times a day (QID) | ORAL | Status: DC | PRN
  Administered 2020-08-12 – 2020-08-13 (×2): 500 mg via ORAL
  Filled 2020-08-12 (×2): qty 1

## 2020-08-12 MED ORDER — PROMETHAZINE HCL 25 MG/ML IJ SOLN: 6.2500 mg | INTRAMUSCULAR | Status: DC | PRN

## 2020-08-12 MED ORDER — CHLORHEXIDINE GLUCONATE 0.12 % MT SOLN: 15.0000 mL | Freq: Once | OROMUCOSAL | Status: AC

## 2020-08-12 MED ORDER — ROCURONIUM BROMIDE 10 MG/ML (PF) SYRINGE
PREFILLED_SYRINGE | INTRAVENOUS | Status: AC
  Filled 2020-08-12: qty 10

## 2020-08-12 MED ORDER — LACTATED RINGERS IV SOLN: INTRAVENOUS | Status: DC

## 2020-08-12 MED ORDER — POLYETHYLENE GLYCOL 3350 17 G PO PACK: 17.0000 g | PACK | Freq: Every day | ORAL | Status: DC | PRN

## 2020-08-12 MED ORDER — POTASSIUM CHLORIDE CRYS ER 20 MEQ PO TBCR
20.0000 meq | EXTENDED_RELEASE_TABLET | Freq: Every day | ORAL | Status: DC
  Administered 2020-08-12: 20 meq via ORAL
  Filled 2020-08-12: qty 1

## 2020-08-12 MED ORDER — DEXAMETHASONE SODIUM PHOSPHATE 10 MG/ML IJ SOLN
INTRAMUSCULAR | Status: AC
  Filled 2020-08-12: qty 1

## 2020-08-12 MED ORDER — FENTANYL CITRATE (PF) 250 MCG/5ML IJ SOLN
INTRAMUSCULAR | Status: DC | PRN
  Administered 2020-08-12 (×3): 50 ug via INTRAVENOUS
  Administered 2020-08-12: 100 ug via INTRAVENOUS
  Administered 2020-08-12: 50 ug via INTRAVENOUS

## 2020-08-12 MED ORDER — NYSTATIN 100000 UNIT/GM EX OINT
TOPICAL_OINTMENT | Freq: Two times a day (BID) | CUTANEOUS | Status: DC
  Filled 2020-08-12: qty 15

## 2020-08-12 MED ORDER — PANTOPRAZOLE SODIUM 40 MG PO TBEC
40.0000 mg | DELAYED_RELEASE_TABLET | Freq: Every day | ORAL | Status: DC
  Administered 2020-08-12: 40 mg via ORAL
  Filled 2020-08-12: qty 1

## 2020-08-12 MED ORDER — OXYCODONE HCL 5 MG PO TABS
5.0000 mg | ORAL_TABLET | ORAL | Status: DC | PRN
  Administered 2020-08-12 – 2020-08-13 (×3): 5 mg via ORAL
  Filled 2020-08-12 (×3): qty 1

## 2020-08-12 MED ORDER — CHLORHEXIDINE GLUCONATE 0.12 % MT SOLN
OROMUCOSAL | Status: AC
  Administered 2020-08-12: 15 mL via OROMUCOSAL
  Filled 2020-08-12: qty 15

## 2020-08-12 MED ORDER — LABETALOL HCL 5 MG/ML IV SOLN
INTRAVENOUS | Status: DC | PRN
  Administered 2020-08-12: 5 mg via INTRAVENOUS

## 2020-08-12 MED ORDER — ACETAMINOPHEN 325 MG PO TABS: 650.0000 mg | ORAL_TABLET | ORAL | Status: DC | PRN

## 2020-08-12 MED ORDER — 0.9 % SODIUM CHLORIDE (POUR BTL) OPTIME
TOPICAL | Status: DC | PRN
  Administered 2020-08-12: 1000 mL

## 2020-08-12 MED ORDER — LISINOPRIL-HYDROCHLOROTHIAZIDE 20-12.5 MG PO TABS: 1.0000 | ORAL_TABLET | Freq: Every day | ORAL | Status: DC

## 2020-08-12 MED ORDER — PROPOFOL 10 MG/ML IV BOLUS
INTRAVENOUS | Status: AC
  Filled 2020-08-12: qty 20

## 2020-08-12 MED ORDER — ONDANSETRON HCL 4 MG/2ML IJ SOLN
INTRAMUSCULAR | Status: AC
  Filled 2020-08-12: qty 2

## 2020-08-12 MED ORDER — SUGAMMADEX SODIUM 200 MG/2ML IV SOLN
INTRAVENOUS | Status: DC | PRN
  Administered 2020-08-12 (×5): 50 mg via INTRAVENOUS

## 2020-08-12 MED ORDER — METFORMIN HCL 500 MG PO TABS
1000.0000 mg | ORAL_TABLET | Freq: Two times a day (BID) | ORAL | Status: DC
  Administered 2020-08-12 – 2020-08-13 (×2): 1000 mg via ORAL
  Filled 2020-08-12 (×2): qty 2

## 2020-08-12 MED ORDER — SODIUM CHLORIDE 0.9 % IV SOLN: INTRAVENOUS | Status: DC

## 2020-08-12 MED ORDER — LISINOPRIL 20 MG PO TABS
20.0000 mg | ORAL_TABLET | Freq: Every day | ORAL | Status: DC
  Administered 2020-08-12: 20 mg via ORAL
  Filled 2020-08-12: qty 1

## 2020-08-12 MED ORDER — ONDANSETRON HCL 4 MG/2ML IJ SOLN: 4.0000 mg | Freq: Four times a day (QID) | INTRAMUSCULAR | Status: DC | PRN

## 2020-08-12 MED ORDER — ACETAMINOPHEN 650 MG RE SUPP: 650.0000 mg | RECTAL | Status: DC | PRN

## 2020-08-12 MED ORDER — GLIPIZIDE 5 MG PO TABS
5.0000 mg | ORAL_TABLET | Freq: Every day | ORAL | Status: DC
  Administered 2020-08-13: 5 mg via ORAL
  Filled 2020-08-12: qty 1

## 2020-08-12 MED ORDER — DORZOLAMIDE HCL-TIMOLOL MAL 2-0.5 % OP SOLN
1.0000 [drp] | Freq: Two times a day (BID) | OPHTHALMIC | Status: DC
  Administered 2020-08-12 – 2020-08-13 (×2): 1 [drp] via OPHTHALMIC
  Filled 2020-08-12: qty 10

## 2020-08-12 MED ORDER — ROCURONIUM BROMIDE 10 MG/ML (PF) SYRINGE
PREFILLED_SYRINGE | INTRAVENOUS | Status: DC | PRN
  Administered 2020-08-12: 80 mg via INTRAVENOUS

## 2020-08-12 MED ORDER — METHOCARBAMOL 1000 MG/10ML IJ SOLN
500.0000 mg | Freq: Four times a day (QID) | INTRAVENOUS | Status: DC | PRN
  Filled 2020-08-12: qty 5

## 2020-08-12 MED ORDER — DEXAMETHASONE SODIUM PHOSPHATE 10 MG/ML IJ SOLN
INTRAMUSCULAR | Status: DC | PRN
  Administered 2020-08-12: 5 mg via INTRAVENOUS

## 2020-08-12 MED ORDER — FENTANYL CITRATE (PF) 250 MCG/5ML IJ SOLN
INTRAMUSCULAR | Status: AC
  Filled 2020-08-12: qty 5

## 2020-08-12 MED ORDER — BUPIVACAINE HCL (PF) 0.25 % IJ SOLN
INTRAMUSCULAR | Status: AC
  Filled 2020-08-12: qty 30

## 2020-08-12 MED ORDER — SODIUM CHLORIDE 0.9% FLUSH: 3.0000 mL | INTRAVENOUS | Status: DC | PRN

## 2020-08-12 MED ORDER — CYANOCOBALAMIN 500 MCG PO TABS
1500.0000 ug | ORAL_TABLET | Freq: Every day | ORAL | Status: DC
  Administered 2020-08-12: 1500 ug via ORAL
  Filled 2020-08-12 (×2): qty 3

## 2020-08-12 MED ORDER — HYDROCHLOROTHIAZIDE 12.5 MG PO CAPS
12.5000 mg | ORAL_CAPSULE | Freq: Every day | ORAL | Status: DC
  Administered 2020-08-12: 12.5 mg via ORAL
  Filled 2020-08-12: qty 1

## 2020-08-12 MED ORDER — MAGNESIUM OXIDE 400 (241.3 MG) MG PO TABS
400.0000 mg | ORAL_TABLET | Freq: Every day | ORAL | Status: DC
  Administered 2020-08-12: 400 mg via ORAL
  Filled 2020-08-12 (×2): qty 1

## 2020-08-12 MED ORDER — DOCUSATE SODIUM 100 MG PO CAPS
100.0000 mg | ORAL_CAPSULE | Freq: Two times a day (BID) | ORAL | Status: DC
  Administered 2020-08-12: 100 mg via ORAL
  Filled 2020-08-12: qty 1

## 2020-08-12 MED ORDER — FENTANYL CITRATE (PF) 100 MCG/2ML IJ SOLN
25.0000 ug | INTRAMUSCULAR | Status: DC | PRN
Start: 2020-08-12 — End: 2020-08-12

## 2020-08-12 MED ORDER — PHENYLEPHRINE HCL-NACL 10-0.9 MG/250ML-% IV SOLN
INTRAVENOUS | Status: DC | PRN
  Administered 2020-08-12: 25 ug/min via INTRAVENOUS

## 2020-08-12 MED ORDER — ONDANSETRON HCL 4 MG PO TABS: 4.0000 mg | ORAL_TABLET | Freq: Four times a day (QID) | ORAL | Status: DC | PRN

## 2020-08-12 MED ORDER — BUPIVACAINE HCL (PF) 0.25 % IJ SOLN
INTRAMUSCULAR | Status: DC | PRN
  Administered 2020-08-12: 8 mL

## 2020-08-12 MED ORDER — MAGNESIUM 200 MG PO TABS
500.0000 mg | ORAL_TABLET | Freq: Every day | ORAL | Status: DC
  Filled 2020-08-12: qty 3

## 2020-08-12 MED ORDER — PROPOFOL 10 MG/ML IV BOLUS
INTRAVENOUS | Status: DC | PRN
  Administered 2020-08-12: 150 mg via INTRAVENOUS

## 2020-08-12 MED ORDER — CEFAZOLIN SODIUM-DEXTROSE 2-4 GM/100ML-% IV SOLN
2.0000 g | INTRAVENOUS | Status: AC
  Administered 2020-08-12: 2 g via INTRAVENOUS

## 2020-08-12 MED ORDER — CEFAZOLIN SODIUM-DEXTROSE 2-4 GM/100ML-% IV SOLN
INTRAVENOUS | Status: AC
  Filled 2020-08-12: qty 100

## 2020-08-12 MED ORDER — ORAL CARE MOUTH RINSE: 15.0000 mL | Freq: Once | OROMUCOSAL | Status: AC

## 2020-08-12 MED ORDER — MENTHOL 3 MG MT LOZG: 1.0000 | LOZENGE | OROMUCOSAL | Status: DC | PRN

## 2020-08-12 MED ORDER — LIDOCAINE 2% (20 MG/ML) 5 ML SYRINGE
INTRAMUSCULAR | Status: DC | PRN
  Administered 2020-08-12: 100 mg via INTRAVENOUS

## 2020-08-12 MED ORDER — ONDANSETRON HCL 4 MG/2ML IJ SOLN
INTRAMUSCULAR | Status: DC | PRN
  Administered 2020-08-12: 4 mg via INTRAVENOUS

## 2020-08-12 MED ORDER — SODIUM CHLORIDE 0.9% FLUSH
3.0000 mL | Freq: Two times a day (BID) | INTRAVENOUS | Status: DC
  Administered 2020-08-12: 3 mL via INTRAVENOUS

## 2020-08-12 MED ORDER — ESMOLOL HCL 100 MG/10ML IV SOLN
INTRAVENOUS | Status: DC | PRN
  Administered 2020-08-12: 30 mg via INTRAVENOUS
  Administered 2020-08-12: 20 mg via INTRAVENOUS

## 2020-08-12 MED ORDER — HYDROMORPHONE HCL 1 MG/ML IJ SOLN
0.5000 mg | INTRAMUSCULAR | Status: DC | PRN
Start: 2020-08-12 — End: 2020-08-13

## 2020-08-12 MED ORDER — PHENOL 1.4 % MT LIQD: 1.0000 | OROMUCOSAL | Status: DC | PRN

## 2020-08-12 MED ORDER — BRIMONIDINE TARTRATE 0.15 % OP SOLN
1.0000 [drp] | Freq: Two times a day (BID) | OPHTHALMIC | Status: DC
  Administered 2020-08-12 – 2020-08-13 (×2): 1 [drp] via OPHTHALMIC
  Filled 2020-08-12 (×2): qty 5

## 2020-08-12 SURGICAL SUPPLY — 45 items
BUR ROUND FLUTED 4 SOFT TCH (BURR) IMPLANT
CANISTER SUCT 3000ML PPV (MISCELLANEOUS) ×2 IMPLANT
CLSR STERI-STRIP ANTIMIC 1/2X4 (GAUZE/BANDAGES/DRESSINGS) ×2 IMPLANT
COVER SURGICAL LIGHT HANDLE (MISCELLANEOUS) ×2 IMPLANT
COVER WAND RF STERILE (DRAPES) ×2 IMPLANT
DECANTER SPIKE VIAL GLASS SM (MISCELLANEOUS) ×2 IMPLANT
DRAPE HALF SHEET 40X57 (DRAPES) ×4 IMPLANT
DRAPE MICROSCOPE LEICA (MISCELLANEOUS) ×2 IMPLANT
DRAPE SURG 17X23 STRL (DRAPES) ×2 IMPLANT
DRSG MEPILEX BORDER 4X4 (GAUZE/BANDAGES/DRESSINGS) ×2 IMPLANT
DRSG MEPILEX BORDER 4X8 (GAUZE/BANDAGES/DRESSINGS) ×1 IMPLANT
DURAPREP 26ML APPLICATOR (WOUND CARE) ×2 IMPLANT
ELECT REM PT RETURN 9FT ADLT (ELECTROSURGICAL) ×2
ELECTRODE REM PT RTRN 9FT ADLT (ELECTROSURGICAL) ×1 IMPLANT
GAUZE SPONGE 4X4 12PLY STRL LF (GAUZE/BANDAGES/DRESSINGS) ×1 IMPLANT
GLOVE BIOGEL PI IND STRL 8 (GLOVE) ×2 IMPLANT
GLOVE BIOGEL PI INDICATOR 8 (GLOVE) ×2
GLOVE ORTHO TXT STRL SZ7.5 (GLOVE) ×4 IMPLANT
GOWN STRL REUS W/ TWL LRG LVL3 (GOWN DISPOSABLE) ×2 IMPLANT
GOWN STRL REUS W/ TWL XL LVL3 (GOWN DISPOSABLE) ×1 IMPLANT
GOWN STRL REUS W/TWL 2XL LVL3 (GOWN DISPOSABLE) ×2 IMPLANT
GOWN STRL REUS W/TWL LRG LVL3 (GOWN DISPOSABLE) ×4
GOWN STRL REUS W/TWL XL LVL3 (GOWN DISPOSABLE) ×2
KIT BASIN OR (CUSTOM PROCEDURE TRAY) ×2 IMPLANT
KIT TURNOVER KIT B (KITS) ×2 IMPLANT
MANIFOLD NEPTUNE II (INSTRUMENTS) ×2 IMPLANT
NDL HYPO 25GX1X1/2 BEV (NEEDLE) ×1 IMPLANT
NDL SPNL 18GX3.5 QUINCKE PK (NEEDLE) ×1 IMPLANT
NEEDLE HYPO 25GX1X1/2 BEV (NEEDLE) ×2 IMPLANT
NEEDLE SPNL 18GX3.5 QUINCKE PK (NEEDLE) ×2 IMPLANT
NS IRRIG 1000ML POUR BTL (IV SOLUTION) ×2 IMPLANT
PACK LAMINECTOMY ORTHO (CUSTOM PROCEDURE TRAY) ×2 IMPLANT
PAD ARMBOARD 7.5X6 YLW CONV (MISCELLANEOUS) ×4 IMPLANT
PATTIES SURGICAL .5 X.5 (GAUZE/BANDAGES/DRESSINGS) IMPLANT
PATTIES SURGICAL .75X.75 (GAUZE/BANDAGES/DRESSINGS) IMPLANT
SUT VIC AB 0 CT1 27 (SUTURE)
SUT VIC AB 0 CT1 27XBRD ANBCTR (SUTURE) IMPLANT
SUT VIC AB 1 CTX 36 (SUTURE) ×2
SUT VIC AB 1 CTX36XBRD ANBCTR (SUTURE) ×1 IMPLANT
SUT VIC AB 2-0 CT1 27 (SUTURE) ×2
SUT VIC AB 2-0 CT1 TAPERPNT 27 (SUTURE) ×1 IMPLANT
SUT VIC AB 3-0 X1 27 (SUTURE) ×2 IMPLANT
TAPE CLOTH SURG 6X10 WHT LF (GAUZE/BANDAGES/DRESSINGS) ×1 IMPLANT
TOWEL GREEN STERILE (TOWEL DISPOSABLE) ×2 IMPLANT
TOWEL GREEN STERILE FF (TOWEL DISPOSABLE) ×2 IMPLANT

## 2020-08-12 NOTE — Anesthesia Procedure Notes (Signed)
Procedure Name: Intubation Date/Time: 08/12/2020 12:38 PM Performed by: Rande Brunt, CRNA Pre-anesthesia Checklist: Patient identified, Emergency Drugs available, Suction available and Patient being monitored Patient Re-evaluated:Patient Re-evaluated prior to induction Oxygen Delivery Method: Circle System Utilized Preoxygenation: Pre-oxygenation with 100% oxygen Induction Type: IV induction Ventilation: Mask ventilation without difficulty Laryngoscope Size: Mac and 3 Grade View: Grade I Tube type: Oral Tube size: 7.0 mm Number of attempts: 1 Airway Equipment and Method: Stylet Placement Confirmation: ETT inserted through vocal cords under direct vision,  positive ETCO2 and breath sounds checked- equal and bilateral Secured at: 22 cm Tube secured with: Tape Dental Injury: Teeth and Oropharynx as per pre-operative assessment

## 2020-08-12 NOTE — Anesthesia Postprocedure Evaluation (Signed)
Anesthesia Post Note  Patient: Tracy Hammond  Procedure(s) Performed: LUMBAR FOUR-FIVE DECOMPRESSION, LEFT LUMBAR FIVE-SACRAL ONE MICRODISCECTOMY (N/A Spine Lumbar)     Patient location during evaluation: PACU Anesthesia Type: General Level of consciousness: sedated Pain management: pain level controlled Vital Signs Assessment: post-procedure vital signs reviewed and stable Respiratory status: spontaneous breathing and respiratory function stable Cardiovascular status: stable Postop Assessment: no apparent nausea or vomiting Anesthetic complications: no   No complications documented.  Last Vitals:  Vitals:   08/12/20 1500 08/12/20 1533  BP: (!) 175/62 (!) 179/82  Pulse: 66 69  Resp: 16 18  Temp: 36.9 C 36.6 C  SpO2: 94% 94%    Last Pain:  Vitals:   08/12/20 1533  TempSrc: Oral  PainSc:                  Claramae Rigdon DANIEL

## 2020-08-12 NOTE — Transfer of Care (Signed)
Immediate Anesthesia Transfer of Care Note  Patient: Tracy Hammond  Procedure(s) Performed: LUMBAR FOUR-FIVE DECOMPRESSION, LEFT LUMBAR FIVE-SACRAL ONE MICRODISCECTOMY (N/A Spine Lumbar)  Patient Location: PACU  Anesthesia Type:General  Level of Consciousness: awake, alert  and oriented  Airway & Oxygen Therapy: Patient Spontanous Breathing and Patient connected to face mask oxygen  Post-op Assessment: Report given to RN, Post -op Vital signs reviewed and stable and Patient moving all extremities  Post vital signs: Reviewed and stable  Last Vitals:  Vitals Value Taken Time  BP 139/95 08/12/20 1414  Temp    Pulse 68 08/12/20 1416  Resp 19 08/12/20 1416  SpO2 100 % 08/12/20 1416  Vitals shown include unvalidated device data.  Last Pain:  Vitals:   08/12/20 1032  TempSrc:   PainSc: 0-No pain         Complications: No complications documented.

## 2020-08-12 NOTE — Interval H&P Note (Signed)
History and Physical Interval Note:  08/12/2020 12:17 PM  Tracy Hammond  has presented today for surgery, with the diagnosis of L4-5 STENOSIS, LEFT L5-S1, HERNIATED NUCLEUS PULPOSUS.  The various methods of treatment have been discussed with the patient and family. After consideration of risks, benefits and other options for treatment, the patient has consented to  Procedure(s): L4-5 DECOMPRESSION, LEFT L5-S1 MICRODISCECTOMY (N/A) as a surgical intervention.  The patient's history has been reviewed, patient examined, no change in status, stable for surgery.  I have reviewed the patient's chart and labs.  Questions were answered to the patient's satisfaction.     Marybelle Killings

## 2020-08-12 NOTE — Discharge Instructions (Addendum)
Ok to shower 1 days postop.  Do not apply any creams or ointments to incision.  .  Can use 4x4 gauze and tape for dressing changes if your dressing comes off. Your dressing is water proof and you can leave it on and shower.    No aggressive activity.    No bending, twisting, squatting or prolonged sitting.  Mostly be in reclined position or lying down.     No driving.

## 2020-08-12 NOTE — H&P (Signed)
Tracy Hammond is an 79 y.o. female.   Chief Complaint: low back pain and LE radiculopathy    HPI:   79 year old white female history of L4-5 and L5-S1 HNP/stenosis comes in for preop evaluation.  She continues have ongoing low back pain and left lower extremity radiculopathy.  She is want to proceed with L4-5 decompression and L5-S1 microdiscectomy as scheduled  Past Medical History:  Diagnosis Date  . Allergy    seasonal  . Arthritis    fingers, back  . Bursitis   . Cancer ([place])    skin cancer arm and face  . Cataract    surgery  . Cough    smokers  . Diabetes mellitus   . GERD (gastroesophageal reflux disease)   . Glaucoma   . Hyperlipidemia    no meds currently 01/22/20  . Hypertension   . Personal history of colonic adenoma 06/12/2008  . Restless leg   . Thyroid disease    young adult-no meds now    Past Surgical History:  Procedure Laterality Date  . Bladder Tact    . CATARACT EXTRACTION  09-07-2012   rt eye  . CATARACT EXTRACTION Right 08/2012  . CHOLECYSTECTOMY    . COLONOSCOPY    . EYE SURGERY    . SKIN CANCER EXCISION    . TONSILLECTOMY AND ADENOIDECTOMY    . TUBAL LIGATION      Family History  Problem Relation Age of Onset  . Hypertension Father        family hx  . Diabetes Mother   . Kidney failure Mother   . Heart disease Mother   . Stroke Sister   . Other Sister        Leg Disease  . Heart disease Son   . Colon cancer Neg Hx   . Esophageal cancer Neg Hx   . Rectal cancer Neg Hx   . Stomach cancer Neg Hx    Social History:  reports that she has been smoking cigarettes. She has a 30.00 pack-year smoking history. She has never used smokeless tobacco. She reports that she does not drink alcohol and does not use drugs.  Allergies: No Known Allergies  No medications prior to admission.    No results found for this or any previous visit (from the past 48 hour(s)). No results found.  Review of Systems  Constitutional: Positive for activity  change.  HENT: Negative.   Respiratory: Positive for cough.   Cardiovascular: Negative.   Gastrointestinal: Negative.   Genitourinary: Negative.   Musculoskeletal: Positive for back pain.  Neurological: Positive for numbness.  Psychiatric/Behavioral: Negative.     There were no vitals taken for this visit. Physical Exam HENT:     Head: Normocephalic.     Nose: Nose normal.  Cardiovascular:     Rate and Rhythm: Regular rhythm.     Heart sounds: Normal heart sounds.  Pulmonary:     Effort: Pulmonary effort is normal. No respiratory distress.  Musculoskeletal:        General: Tenderness present.     Cervical back: Normal range of motion.  Neurological:     General: No focal deficit present.     Mental Status: She is alert and oriented to person, place, and time.      Assessment/Plan L4-5 and L5-S1 HNP/stenosis   We will proceed with surgery as scheduled..surgical procedure discussed along with potential rehab/recovery time.  All questions answered.    Benjiman Core, PA-C 08/12/2020, 6:19 AM

## 2020-08-12 NOTE — Op Note (Signed)
Preop diagnosis: Left L5-S1 HNP with radiculopathy, severe L4-5 stenosis.  Postop diagnosis: Same  Procedure: Left L5-S1 microdiscectomy.  L4-5 decompression with L4 laminectomy, central decompression.  Surgeon: Rodell Perna, MD  Assistant: Benjiman Core, PA-C medically necessary and present for the entire procedure  Anesthesia: General plus Marcaine skin local.  Findings large extruded disc left paracentral L5-S1.  Spinal stenosis L4-5 multifactorial.  Procedure: After induction general anesthesia intubation patient placed prone on chest rolls careful padding yellow foam pads underneath the shoulders.  Back was prepped with DuraPrep 1010 drape of been applied at the top of the gluteal cleft.  After DuraPrep was dry the area squared with towels timeout procedure was completed Betadine Steri-Drape and laminectomy sheet.  Spinal needle was placed crosstable lateral x-ray was used for localization and then incision was made with subperiosteal dissection on the lamina.  Preoperative antibiotics had been given prior to starting the case Ancef prophylaxis.  Kocher clamp was placed over the lamina of L4 at the top of pain decompression.  Patient had severe stenosis short segment just at the level of the disc and up at the level of the L4 pedicle the patient had adequate canal space.  Cook clamp was placed at the L5-S1 disc space and crosstable lateral x-ray confirmed proper position and purple marker was used on the bone.  Laminectomy was performed at L4 taking at three fourths of the lamina right and left side thinning the lamina with the bur.  L5-S1 on the left was surgically addressed first to the patient's primarily worse leg pain and neurogenic claudication symptoms.  Extensive ligaments were removed laterally in the gutter and the disc was carefully opened using the operative microscope.  There were paracentral chunks of disc that was adherent and finally the hockey-stick and Barbra Sarks was able to separate  extruded fragment from disc.  There was some chunks with fibers are more thickened disc fragments that came out in small little pieces like this.  They were retained by the annulus once these pieces were removed the dura was well decompressed.  Foraminal was enlarged opening up for the nerve root bone was removed at the level of the pedicle.  Osteotome was used to lightly against for partial facet resection for adequate room.  Once there was good decompression L5-S1 attention was turned to L4-5.  Further thinning of the lamina of the 4 mm bur and removing remaining bone with Kerrison rongeurs protecting the dura with patties.  Extensive ligament overhanging spurs were removed until there was good central decompression of the dural tube was round.  Bipolar was used in the gutters there was minimal epidural bleeding.  Right left gutter was run prior to closure make sure that there were no remaining pieces of bone and sharp areas or chunks of ligament the need to be removed.  Copious irrigation closure of the fascia #1 Vicryl ~subtenons tissue skin staple closure postop dressing and transferred to covering.

## 2020-08-13 ENCOUNTER — Telehealth: Payer: Self-pay | Admitting: Orthopaedic Surgery

## 2020-08-13 ENCOUNTER — Encounter (HOSPITAL_COMMUNITY): Payer: Self-pay | Admitting: Orthopaedic Surgery

## 2020-08-13 DIAGNOSIS — M4807 Spinal stenosis, lumbosacral region: Secondary | ICD-10-CM | POA: Diagnosis not present

## 2020-08-13 DIAGNOSIS — Z79899 Other long term (current) drug therapy: Secondary | ICD-10-CM | POA: Diagnosis not present

## 2020-08-13 DIAGNOSIS — I10 Essential (primary) hypertension: Secondary | ICD-10-CM | POA: Diagnosis not present

## 2020-08-13 DIAGNOSIS — Z85828 Personal history of other malignant neoplasm of skin: Secondary | ICD-10-CM | POA: Diagnosis not present

## 2020-08-13 DIAGNOSIS — M48061 Spinal stenosis, lumbar region without neurogenic claudication: Secondary | ICD-10-CM | POA: Diagnosis not present

## 2020-08-13 DIAGNOSIS — M5126 Other intervertebral disc displacement, lumbar region: Secondary | ICD-10-CM | POA: Diagnosis not present

## 2020-08-13 DIAGNOSIS — F17210 Nicotine dependence, cigarettes, uncomplicated: Secondary | ICD-10-CM | POA: Diagnosis not present

## 2020-08-13 DIAGNOSIS — E119 Type 2 diabetes mellitus without complications: Secondary | ICD-10-CM | POA: Diagnosis not present

## 2020-08-13 LAB — GLUCOSE, CAPILLARY: Glucose-Capillary: 117 mg/dL — ABNORMAL HIGH (ref 70–99)

## 2020-08-13 MED ORDER — OXYCODONE-ACETAMINOPHEN 5-325 MG PO TABS: 1.0000 | ORAL_TABLET | ORAL | 0 refills | Status: DC | PRN

## 2020-08-13 NOTE — Progress Notes (Signed)
   Subjective: 1 Day Post-Op Procedure(s) (LRB): LUMBAR FOUR-FIVE DECOMPRESSION, LEFT LUMBAR FIVE-SACRAL ONE MICRODISCECTOMY (N/A) Patient reports pain as moderate.  Had some cramping in left leg.   Objective: Vital signs in last 24 hours: Temp:  [97.6 F (36.4 C)-98.5 F (36.9 C)] 97.9 F (36.6 C) (12/14 0425) Pulse Rate:  [66-69] 68 (12/14 0425) Resp:  [16-18] 18 (12/14 0425) BP: (126-184)/(55-95) 144/66 (12/14 0425) SpO2:  [92 %-100 %] 96 % (12/14 0425) Weight:  [60.3 kg] 60.3 kg (12/13 0958)  Intake/Output from previous day: 12/13 0701 - 12/14 0700 In: 900 [I.V.:800; IV Piggyback:100] Out: 50 [Blood:50] Intake/Output this shift: No intake/output data recorded.  No results for input(s): HGB in the last 72 hours. No results for input(s): WBC, RBC, HCT, PLT in the last 72 hours. No results for input(s): NA, K, CL, CO2, BUN, CREATININE, GLUCOSE, CALCIUM in the last 72 hours. No results for input(s): LABPT, INR in the last 72 hours.  Neurologically intact DG Lumbar Spine 2-3 Views  Result Date: 08/12/2020 CLINICAL DATA:  79 year old female undergoing lumbar surgery. EXAM: LUMBAR SPINE - 2-3 VIEW COMPARISON:  Lumbar radiographs 04/12/2019.  Lumbar MRI 03/16/2020. FINDINGS: Normal lumbar segmentation on the radiographs last year, same numbering system used on the July MRI. Intraoperative portable cross-table lateral views of the lumbar spine. Film it 1249 hours. Posterior needles directed at the L3 and L5 vertebra. Film at 1256 hours. Posterior retractors in place. Surgical clamp or probe directed toward the L4-L5 and L5-S1 disc space, with vacuum phenomena noted. IMPRESSION: Intraoperative localization ultimately at L4-L5 and L5-S1. Electronically Signed   By: Genevie Ann M.D.   On: 08/12/2020 13:33    Assessment/Plan: 1 Day Post-Op Procedure(s) (LRB): LUMBAR FOUR-FIVE DECOMPRESSION, LEFT LUMBAR FIVE-SACRAL ONE MICRODISCECTOMY (N/A) Up with therapy  Marybelle Killings 08/13/2020, 7:59  AM

## 2020-08-13 NOTE — Evaluation (Signed)
Occupational Therapy Evaluation Patient Details Name: Tracy Hammond MRN: [id number] DOB: February 18, 1941 Today's Date: 08/13/2020    History of Present Illness Pt admitted for elective L4-5 decompression and L5-S1 microdiscectomy due stenosis and herniated nucleus luposus. PMH: macular degeneration, thyroid disease PSH: cataract extraction.   Clinical Impression   Patient is s/p L4-5 decompression and L5-s1 microdiscectomy surgery resulting in functional limitations due to the deficits listed below (see OT problem list). Pt currently with poor recall and needs reinforcement of precautions with adls.  Patient will benefit from skilled OT acutely to increase independence and safety with ADLS to allow discharge home. Pt asking a few times during session when daughter and spouse will arive.      Follow Up Recommendations  No OT follow up    Equipment Recommendations  None recommended by OT    Recommendations for Other Services       Precautions / Restrictions Precautions Precautions: Back Precaution Booklet Issued: Yes (comment) Precaution Comments: verbalized back precaution with adls with return demonstration Required Braces or Orthoses:  (no brace needed) Restrictions Weight Bearing Restrictions: No      Mobility Bed Mobility Overal bed mobility: Needs Assistance Bed Mobility: Rolling;Sidelying to Sit;Sit to Sidelying Rolling: Min guard Sidelying to sit: Min assist     Sit to sidelying: Min assist General bed mobility comments: in chair on arrival    Transfers Overall transfer level: Needs assistance Equipment used: None Transfers: Sit to/from Stand Sit to Stand: Supervision         General transfer comment: min guard for safety, pt guarded and cautious    Balance Overall balance assessment: Mild deficits observed, not formally tested                                         ADL either performed or assessed with clinical judgement   ADL  Overall ADL's : Needs assistance/impaired Eating/Feeding: Set up   Grooming: Set up               Lower Body Dressing: Set up Lower Body Dressing Details (indicate cue type and reason): cues to avoid bending during figure 4 dressing Toilet Transfer: Supervision/safety   Toileting- Clothing Manipulation and Hygiene: Supervision/safety Toileting - Clothing Manipulation Details (indicate cue type and reason): cues for not bending for toilet paper at home     Functional mobility during ADLs: Supervision/safety       Vision Baseline Vision/History: Glaucoma       Perception     Praxis      Pertinent Vitals/Pain Pain Assessment: 0-10 Pain Score: 4  Pain Location: incision Pain Descriptors / Indicators: Constant;Discomfort     Hand Dominance Right   Extremity/Trunk Assessment Upper Extremity Assessment Upper Extremity Assessment: Overall WFL for tasks assessed   Lower Extremity Assessment Lower Extremity Assessment: Defer to PT evaluation   Cervical / Trunk Assessment Cervical / Trunk Assessment: Other exceptions Cervical / Trunk Exceptions: back surgery   Communication Communication Communication: No difficulties   Cognition Arousal/Alertness: Awake/alert Behavior During Therapy: WFL for tasks assessed/performed Overall Cognitive Status: Within Functional Limits for tasks assessed                                     General Comments  pt reports needing a dressing. pt with new dressing don at  this time and no awareness to dressing in place. poor recall of RN donning new dressing    Exercises Exercises: Other exercises Other Exercises Other Exercises: instructed on isometric ab exercises   Shoulder Instructions      Home Living Family/patient expects to be discharged to:: Private residence Living Arrangements: Spouse/significant other Available Help at Discharge: Family;Available 24 hours/day Type of Home: House Home Access: Stairs to  enter [employer] of Steps: 4 Entrance Stairs-Rails: Can reach both Home Layout: One level     Bathroom Shower/Tub: Teacher, early years/pre: Handicapped height     Home Equipment: Environmental consultant - 2 wheels   Additional Comments: daughter will be in town until after the start of 2022 to care for patient      Prior Functioning/Environment Level of Independence: Independent        Comments: doesnt drive due to macular degeneration        OT Problem List: Decreased strength;Decreased activity tolerance;Impaired balance (sitting and/or standing);Decreased cognition;Decreased safety awareness;Decreased knowledge of precautions;Decreased knowledge of use of DME or AE;Pain      OT Treatment/Interventions: Self-care/ADL training    OT Goals(Current goals can be found in the care plan section) Acute Rehab OT Goals Patient Stated Goal: home today OT Goal Formulation: With patient Time For Goal Achievement: 08/27/20 Potential to Achieve Goals: Good  OT Frequency: Min 2X/week   Barriers to D/C:            Co-evaluation              AM-PAC OT "6 Clicks" Daily Activity     Outcome Measure Help from another person eating meals?: None Help from another person taking care of personal grooming?: None Help from another person toileting, which includes using toliet, bedpan, or urinal?: A Little Help from another person bathing (including washing, rinsing, drying)?: A Little Help from another person to put on and taking off regular upper body clothing?: A Little Help from another person to put on and taking off regular lower body clothing?: A Little 6 Click Score: 20   End of Session Nurse Communication: Mobility status;Precautions  Activity Tolerance: Patient tolerated treatment well Patient left: in chair;with call bell/phone within reach  OT Visit Diagnosis: Unsteadiness on feet (R26.81);Muscle weakness (generalized) (M62.81)                Time:  9030-0923 OT Time Calculation (min): 22 min Charges:  OT General Charges $OT Visit: 1 Visit OT Evaluation $OT Eval Moderate Complexity: 1 Mod   Tracy Hammond, OTR/L  Acute Rehabilitation Services Pager: [phone] Office: [phone] .   Jeri Modena 08/13/2020, 9:51 AM

## 2020-08-13 NOTE — Progress Notes (Signed)
Patient is discharged from room 3C08 at this time. Alert and in stable condition. IV site d/c'd and instructions read to patient and daughter with understanding verbalized and all questions answered. Left unit via wheelchair with all belongings at side.  

## 2020-08-13 NOTE — Telephone Encounter (Signed)
noted 

## 2020-08-13 NOTE — Telephone Encounter (Signed)
Pharm called stating they needed to follow the CDC guildlines and stated they changed pt pain medication to all 40 ,1 every 4 hours as needed for pain and this was percocet 5

## 2020-08-13 NOTE — Evaluation (Signed)
Physical Therapy Evaluation Patient Details Name: Tracy Hammond MRN: [id number] DOB: 09/13/1940 Today's Date: 08/13/2020   History of Present Illness  Pt admitted for elective L4-5 decompression and L5-S1 microdiscectomy due stenosis and herniated nucleus luposus. PMH: macular degeneration, thyroid disease PSH: cataract extraction.    Clinical Impression  Pt admitted for above. Pt functioning at min guard and requires verbal cues to adhere to back precautions. Pt with good home set up and support. Pt reports she will have a RW she will use at home when she can't hold onto her husband. Pt mildly unsteady however suspect this to be baseline. Educated on walking program and isometric ab exercises. Acute PT to cont to follow to progress independence.    Follow Up Recommendations No PT follow up;Supervision/Assistance - 24 hour    Equipment Recommendations  None recommended by PT (will have RW to use at home if desired)    Recommendations for Other Services       Precautions / Restrictions Precautions Precautions: Back Precaution Booklet Issued: Yes (comment) Precaution Comments: pt unable to ready due to macular degeneration but had verbal confirmation of understanding Required Braces or Orthoses:  (no brace needed) Restrictions Weight Bearing Restrictions: No      Mobility  Bed Mobility Overal bed mobility: Needs Assistance Bed Mobility: Rolling;Sidelying to Sit;Sit to Sidelying Rolling: Min guard Sidelying to sit: Min assist     Sit to sidelying: Min assist General bed mobility comments: pt with tendency to twist during transition from sidelying to back, minA to adhere to precautions and minA for trunk elevation to sit EOB    Transfers Overall transfer level: Needs assistance Equipment used: None Transfers: Sit to/from Stand Sit to Stand: Min guard         General transfer comment: min guard for safety, pt guarded and cautious  Ambulation/Gait Ambulation/Gait  assistance: Min guard Gait Distance (Feet): 200 Feet Assistive device: 1 person hand held assist Gait Pattern/deviations: Step-through pattern;Decreased stride length Gait velocity: dec Gait velocity interpretation: <1.31 ft/sec, indicative of household ambulator General Gait Details: short step height, slow, guarded, mild unsteadiness with turning, pt reports "my nephew brought me a walker to use in the house"  Stairs Stairs: Yes Stairs assistance: Min guard;Min assist Stair Management: One rail Right;Step to pattern;Forwards Number of Stairs: 4 General stair comments: pt wanted to twist and hold onto railing on R with both hands, instructed on sideways or forwards with handrial on R and HHA on L to mimic bilateral handrails at home  Wheelchair Mobility    Modified Rankin (Stroke Patients Only)       Balance Overall balance assessment: Mild deficits observed, not formally tested                                           Pertinent Vitals/Pain Pain Assessment: 0-10 Pain Score: 4  Pain Location: incision Pain Descriptors / Indicators: Constant;Discomfort    Home Living Family/patient expects to be discharged to:: Private residence Living Arrangements: Spouse/significant other Available Help at Discharge: Family;Available 24 hours/day Type of Home: House Home Access: Stairs to enter Entrance Stairs-Rails: Can reach both Entrance Stairs-Number of Steps: 4 Home Layout: One level Home Equipment: Walker - 2 wheels      Prior Function Level of Independence: Independent         Comments: doesnt drive due to macular degeneration  Hand Dominance   Dominant Hand: Right    Extremity/Trunk Assessment   Upper Extremity Assessment Upper Extremity Assessment: Overall WFL for tasks assessed    Lower Extremity Assessment Lower Extremity Assessment: Generalized weakness    Cervical / Trunk Assessment Cervical / Trunk Assessment: Other  exceptions Cervical / Trunk Exceptions: back surgery  Communication   Communication: No difficulties  Cognition Arousal/Alertness: Awake/alert Behavior During Therapy: WFL for tasks assessed/performed Overall Cognitive Status: Within Functional Limits for tasks assessed                                        General Comments General comments (skin integrity, edema, etc.): dressing intact, no drainage    Exercises Other Exercises Other Exercises: instructed on isometric ab exercises   Assessment/Plan    PT Assessment Patient needs continued PT services  PT Problem List Decreased strength;Decreased range of motion;Decreased activity tolerance;Decreased balance;Decreased mobility;Decreased knowledge of use of DME       PT Treatment Interventions DME instruction;Gait training;Stair training;Therapeutic exercise;Therapeutic activities;Functional mobility training;Balance training;Neuromuscular re-education    PT Goals (Current goals can be found in the Care Plan section)  Acute Rehab PT Goals Patient Stated Goal: home today PT Goal Formulation: With patient Time For Goal Achievement: 08/27/20 Potential to Achieve Goals: Good    Frequency Min 5X/week   Barriers to discharge        Co-evaluation               AM-PAC PT "6 Clicks" Mobility  Outcome Measure Help needed turning from your back to your side while in a flat bed without using bedrails?: A Little Help needed moving from lying on your back to sitting on the side of a flat bed without using bedrails?: A Little Help needed moving to and from a bed to a chair (including a wheelchair)?: A Little Help needed standing up from a chair using your arms (e.g., wheelchair or bedside chair)?: A Little Help needed to walk in hospital room?: A Little Help needed climbing 3-5 steps with a railing? : A Little 6 Click Score: 18    End of Session Equipment Utilized During Treatment: Gait belt Activity  Tolerance: Patient tolerated treatment well Patient left: in chair;with call bell/phone within reach Nurse Communication: Mobility status PT Visit Diagnosis: Difficulty in walking, not elsewhere classified (R26.2)    Time: 0981-1914 PT Time Calculation (min) (ACUTE ONLY): 20 min   Charges:   PT Evaluation $PT Eval Moderate Complexity: 1 Mod          Kittie Plater, PT, DPT Acute Rehabilitation Services Pager #: [phone] Office #: [phone]   Berline Lopes 08/13/2020, 9:29 AM

## 2020-08-15 ENCOUNTER — Other Ambulatory Visit (HOSPITAL_COMMUNITY): Payer: Medicare Other

## 2020-08-20 ENCOUNTER — Encounter: Payer: Self-pay | Admitting: Orthopaedic Surgery

## 2020-08-20 ENCOUNTER — Ambulatory Visit (INDEPENDENT_AMBULATORY_CARE_PROVIDER_SITE_OTHER): Payer: Medicare Other | Admitting: Orthopaedic Surgery

## 2020-08-20 VITALS — BP 180/73 | HR 72 | Ht 60.0 in | Wt 133.0 lb

## 2020-08-20 DIAGNOSIS — M5126 Other intervertebral disc displacement, lumbar region: Secondary | ICD-10-CM

## 2020-08-20 MED ORDER — HYDROCODONE-ACETAMINOPHEN 5-325 MG PO TABS: 1.0000 | ORAL_TABLET | Freq: Four times a day (QID) | ORAL | 0 refills | Status: DC | PRN

## 2020-08-20 NOTE — Progress Notes (Signed)
Post-Op Visit Note   Patient: Tracy Hammond           Date of Birth: Jan 29, 1941           MRN: [id number] Visit Date: 08/20/2020 PCP: Hoyt Koch, MD   Assessment & Plan: Post L4-5 decompression and left L5-S1 microdiscectomy.  Good relief of left leg pain.  Incision looks good she has some swelling at the decompression site and little bit of ecchymosis where she had the honeycomb dressing applied.  New dressing applied she is fine for showering I will have her return in 1 week for staple removal.  Chief Complaint:  Chief Complaint  Patient presents with  . Lower Back - Routine Post Op    08/12/2020 L4-5 decompression, left L5-S1 microdiscectomy   Visit Diagnoses:  1. HNP (herniated nucleus pulposus), lumbar   2. Protrusion of lumbar intervertebral disc     Plan: Return in 1 week for staple removal.  Follow-Up Instructions: Return in about 1 week (around 08/27/2020).   Orders:  No orders of the defined types were placed in this encounter.  No orders of the defined types were placed in this encounter.   Imaging: No results found.  PMFS History: Patient Active Problem List   Diagnosis Date Noted  . HNP (herniated nucleus pulposus), lumbar 08/12/2020  . Protrusion of lumbar intervertebral disc 03/19/2020  . Spinal stenosis of lumbar region 03/19/2020  . Unilateral primary osteoarthritis, right hip 12/29/2017  . Rash 10/29/2017  . Smokers' cough ([place]) 11/13/2016  . Routine general medical examination at a health care facility 09/05/2015  . Diarrhea 03/15/2015  . Restless leg syndrome 08/28/2014  . VARICOSE VEINS, LOWER EXTREMITIES 05/14/2008  . Hyperlipidemia associated with type 2 diabetes mellitus ([place]) 04/14/2007  . Diabetes mellitus type 2 with complications ([place]) 78/29/5621  . Essential hypertension 03/29/2007   Past Medical History:  Diagnosis Date  . Allergy    seasonal  . Arthritis    fingers, back  . Bursitis   . Cancer ([place])    skin  cancer arm and face  . Cataract    surgery  . Cough    smokers  . Diabetes mellitus   . GERD (gastroesophageal reflux disease)   . Glaucoma   . Hyperlipidemia    no meds currently 01/22/20  . Hypertension   . Personal history of colonic adenoma 06/12/2008  . Restless leg   . Thyroid disease    young adult-no meds now    Family History  Problem Relation Age of Onset  . Hypertension Father        family hx  . Diabetes Mother   . Kidney failure Mother   . Heart disease Mother   . Stroke Sister   . Other Sister        Leg Disease  . Heart disease Son   . Colon cancer Neg Hx   . Esophageal cancer Neg Hx   . Rectal cancer Neg Hx   . Stomach cancer Neg Hx     Past Surgical History:  Procedure Laterality Date  . Bladder Tact    . CATARACT EXTRACTION  09-07-2012   rt eye  . CATARACT EXTRACTION Right 08/2012  . CHOLECYSTECTOMY    . COLONOSCOPY    . EYE SURGERY    . LUMBAR LAMINECTOMY/DECOMPRESSION MICRODISCECTOMY N/A 08/12/2020   Procedure: LUMBAR FOUR-FIVE DECOMPRESSION, LEFT LUMBAR FIVE-SACRAL ONE MICRODISCECTOMY;  Surgeon: Marybelle Killings, MD;  Location: [facility];  Service: Orthopedics;  Laterality: N/A;  .  SKIN CANCER EXCISION    . TONSILLECTOMY AND ADENOIDECTOMY    . TUBAL LIGATION     Social History   Occupational History  . Occupation: housewife  Tobacco Use  . Smoking status: Current Every Day Smoker    Packs/day: 0.50    Years: 60.00    Pack years: 30.00    Types: Cigarettes  . Smokeless tobacco: Never Used  Vaping Use  . Vaping Use: Never used  Substance and Sexual Activity  . Alcohol use: No  . Drug use: No  . Sexual activity: Not on file

## 2020-08-21 ENCOUNTER — Other Ambulatory Visit: Payer: Self-pay | Admitting: Internal Medicine

## 2020-08-21 NOTE — Telephone Encounter (Signed)
Patient requesting refill for Zanaflex 2 mg tablets.  LOV- 07/19/20 Next OV- Not scheduled  Last refilled- 02/23/20

## 2020-08-26 ENCOUNTER — Other Ambulatory Visit: Payer: Self-pay | Admitting: Internal Medicine

## 2020-08-27 ENCOUNTER — Other Ambulatory Visit: Payer: Self-pay

## 2020-08-27 ENCOUNTER — Encounter: Payer: Self-pay | Admitting: Orthopaedic Surgery

## 2020-08-27 ENCOUNTER — Ambulatory Visit (INDEPENDENT_AMBULATORY_CARE_PROVIDER_SITE_OTHER): Payer: Medicare Other | Admitting: Orthopaedic Surgery

## 2020-08-27 DIAGNOSIS — Z9889 Other specified postprocedural states: Secondary | ICD-10-CM | POA: Insufficient documentation

## 2020-08-27 NOTE — Progress Notes (Signed)
Post-Op Visit Note   Patient: Tracy Hammond           Date of Birth: Oct 04, 1940           MRN: [id number] Visit Date: 08/27/2020 PCP: Myrlene Broker, MD   Assessment & Plan: Postop L4-5 decompression left L5-S1 microdiscectomy.  Patient had some hydrocodone for pain.  Staples harvested today she is continue walking program will avoid sitting bending and lifting until she is 6 weeks postop.  Recheck 4 weeks.  Chief Complaint:  Chief Complaint  Patient presents with  . Lower Back - Follow-up    08/12/2020 L4-5 decompression, Left L5-S1 microdiscectomy   Visit Diagnoses:  1. Status post lumbar spine surgery for decompression of spinal cord     Plan: Staples harvested recheck 4 weeks.  Follow-Up Instructions: No follow-ups on file.   Orders:  No orders of the defined types were placed in this encounter.  No orders of the defined types were placed in this encounter.   Imaging: No results found.  PMFS History: Patient Active Problem List   Diagnosis Date Noted  . Status post lumbar spine surgery for decompression of spinal cord 08/27/2020  . HNP (herniated nucleus pulposus), lumbar 08/12/2020  . Protrusion of lumbar intervertebral disc 03/19/2020  . Spinal stenosis of lumbar region 03/19/2020  . Unilateral primary osteoarthritis, right hip 12/29/2017  . Rash 10/29/2017  . Smokers' cough ([facility]) 11/13/2016  . Routine general medical examination at a health care facility 09/05/2015  . Diarrhea 03/15/2015  . Restless leg syndrome 08/28/2014  . VARICOSE VEINS, LOWER EXTREMITIES 05/14/2008  . Hyperlipidemia associated with type 2 diabetes mellitus ([facility]) 04/14/2007  . Diabetes mellitus type 2 with complications ([facility]) 03/29/2007  . Essential hypertension 03/29/2007   Past Medical History:  Diagnosis Date  . Allergy    seasonal  . Arthritis    fingers, back  . Bursitis   . Cancer ([facility])    skin cancer arm and face  . Cataract    surgery  . Cough    smokers  .  Diabetes mellitus   . GERD (gastroesophageal reflux disease)   . Glaucoma   . Hyperlipidemia    no meds currently 01/22/20  . Hypertension   . Personal history of colonic adenoma 06/12/2008  . Restless leg   . Thyroid disease    young adult-no meds now    Family History  Problem Relation Age of Onset  . Hypertension Father        family hx  . Diabetes Mother   . Kidney failure Mother   . Heart disease Mother   . Stroke Sister   . Other Sister        Leg Disease  . Heart disease Son   . Colon cancer Neg Hx   . Esophageal cancer Neg Hx   . Rectal cancer Neg Hx   . Stomach cancer Neg Hx     Past Surgical History:  Procedure Laterality Date  . Bladder Tact    . CATARACT EXTRACTION  09-07-2012   rt eye  . CATARACT EXTRACTION Right 08/2012  . CHOLECYSTECTOMY    . COLONOSCOPY    . EYE SURGERY    . LUMBAR LAMINECTOMY/DECOMPRESSION MICRODISCECTOMY N/A 08/12/2020   Procedure: LUMBAR FOUR-FIVE DECOMPRESSION, LEFT LUMBAR FIVE-SACRAL ONE MICRODISCECTOMY;  Surgeon: Eldred Manges, MD;  Location: [facility];  Service: Orthopedics;  Laterality: N/A;  . SKIN CANCER EXCISION    . TONSILLECTOMY AND ADENOIDECTOMY    . TUBAL  LIGATION     Social History   Occupational History  . Occupation: housewife  Tobacco Use  . Smoking status: Current Every Day Smoker    Packs/day: 0.50    Years: 60.00    Pack years: 30.00    Types: Cigarettes  . Smokeless tobacco: Never Used  Vaping Use  . Vaping Use: Never used  Substance and Sexual Activity  . Alcohol use: No  . Drug use: No  . Sexual activity: Not on file

## 2020-09-03 ENCOUNTER — Other Ambulatory Visit: Payer: Self-pay | Admitting: Internal Medicine

## 2020-09-06 NOTE — Discharge Summary (Signed)
Patient ID: Tracy Hammond MRN: [id number] DOB/AGE: Feb 10, 1941 80 y.o.  Admit date: 08/12/2020 Discharge date: 08/13/2020 Admission Diagnoses:  Active Problems:   Protrusion of lumbar intervertebral disc   Spinal stenosis of lumbar region   HNP (herniated nucleus pulposus), lumbar   Discharge Diagnoses:  Active Problems:   Protrusion of lumbar intervertebral disc   Spinal stenosis of lumbar region   HNP (herniated nucleus pulposus), lumbar  status post Procedure(s): LUMBAR FOUR-FIVE DECOMPRESSION, LEFT LUMBAR FIVE-SACRAL ONE MICRODISCECTOMY  Past Medical History:  Diagnosis Date  . Allergy    seasonal  . Arthritis    fingers, back  . Bursitis   . Cancer ([place])    skin cancer arm and face  . Cataract    surgery  . Cough    smokers  . Diabetes mellitus   . GERD (gastroesophageal reflux disease)   . Glaucoma   . Hyperlipidemia    no meds currently 01/22/20  . Hypertension   . Personal history of colonic adenoma 06/12/2008  . Restless leg   . Thyroid disease    young adult-no meds now    Surgeries: Procedure(s): LUMBAR FOUR-FIVE DECOMPRESSION, LEFT LUMBAR FIVE-SACRAL ONE MICRODISCECTOMY on 08/12/2020   Consultants:   Discharged Condition: Improved  Hospital Course: Tracy Hammond is an 80 y.o. female who was admitted 08/12/2020 for operative treatment of lumbar stenosis. Patient failed conservative treatments (please see the history and physical for the specifics) and had severe unremitting pain that affects sleep, daily activities and work/hobbies. After pre-op clearance, the patient was taken to the operating room on 08/12/2020 and underwent  Procedure(s): LUMBAR FOUR-FIVE DECOMPRESSION, LEFT LUMBAR FIVE-SACRAL ONE MICRODISCECTOMY.    Patient was given perioperative antibiotics:  Anti-infectives (From admission, onward)   Start     Dose/Rate Route Frequency Ordered Stop   08/12/20 1030  ceFAZolin (ANCEF) IVPB 2g/100 mL premix        2 g 200 mL/hr over 30  Minutes Intravenous On call to O.R. 08/12/20 1019 08/12/20 1245   08/12/20 1028  ceFAZolin (ANCEF) 2-4 GM/100ML-% IVPB       Note to Pharmacy: Alvy Beal   : cabinet override      08/12/20 1028 08/12/20 1253       Patient was given sequential compression devices and early ambulation to prevent DVT.   Patient benefited maximally from hospital stay and there were no complications. At the time of discharge, the patient was urinating/moving their bowels without difficulty, tolerating a regular diet, pain is controlled with oral pain medications and they have been cleared by PT/OT.   Recent vital signs: No data found.   Recent laboratory studies: No results for input(s): WBC, HGB, HCT, PLT, NA, K, CL, CO2, BUN, CREATININE, GLUCOSE, INR, CALCIUM in the last 72 hours.  Invalid input(s): PT, 2   Discharge Medications:   Allergies as of 08/13/2020   No Known Allergies     Medication List    TAKE these medications   aspirin 81 MG tablet Take 1 tablet (81 mg total) by mouth every morning. STOP TODAY 11/25, RESTART 08/08/2013 What changed: additional instructions   brimonidine 0.15 % ophthalmic solution Commonly known as: ALPHAGAN Place 1 drop into both eyes in the morning and at bedtime.   Calcium 1200 1200-1000 MG-UNIT Chew Chew 1 tablet by mouth daily.   Cyanocobalamin 1500 MCG Tbdp Take 1,500 mcg by mouth daily.   diclofenac sodium 1 % Gel Commonly known as: VOLTAREN APPLY A THIN LAYER TO AFFECTED AREA  UP TO 4 TIMES A DAY AS NEEDED   diphenoxylate-atropine 2.5-0.025 MG tablet Commonly known as: Lomotil Take 1 tablet by mouth 4 (four) times daily as needed for diarrhea or loose stools.   dorzolamide-timolol 22.3-6.8 MG/ML ophthalmic solution Commonly known as: COSOPT Place 1 drop into both eyes 2 (two) times daily.   gabapentin 100 MG capsule Commonly known as: NEURONTIN Take 1 capsule (100 mg total) by mouth 2 (two) times daily.   lansoprazole 30 MG  capsule Commonly known as: PREVACID Take 30 mg by mouth every morning.   lisinopril-hydrochlorothiazide 20-12.5 MG tablet Commonly known as: ZESTORETIC Take 1 tablet by mouth once daily   MAGNESIUM PO Take 500 mg by mouth daily.   mirtazapine 15 MG tablet Commonly known as: Remeron Take 1 tablet (15 mg total) by mouth at bedtime.   multivitamin with minerals Tabs tablet Take 1 tablet by mouth every morning.   nystatin ointment Commonly known as: MYCOSTATIN Apply topically 2 (two) times daily.   oxyCODONE-acetaminophen 5-325 MG tablet Commonly known as: Percocet Take 1-2 tablets by mouth every 4 (four) hours as needed for severe pain.   potassium chloride SA 20 MEQ tablet Commonly known as: KLOR-CON Take 1 tablet (20 mEq total) by mouth daily.   rOPINIRole 2 MG tablet Commonly known as: REQUIP Take 1 tablet (2 mg total) by mouth daily.       Diagnostic Studies: DG Lumbar Spine 2-3 Views  Result Date: 08/12/2020 CLINICAL DATA:  80 year old female undergoing lumbar surgery. EXAM: LUMBAR SPINE - 2-3 VIEW COMPARISON:  Lumbar radiographs 04/12/2019.  Lumbar MRI 03/16/2020. FINDINGS: Normal lumbar segmentation on the radiographs last year, same numbering system used on the July MRI. Intraoperative portable cross-table lateral views of the lumbar spine. Film it 1249 hours. Posterior needles directed at the L3 and L5 vertebra. Film at 1256 hours. Posterior retractors in place. Surgical clamp or probe directed toward the L4-L5 and L5-S1 disc space, with vacuum phenomena noted. IMPRESSION: Intraoperative localization ultimately at L4-L5 and L5-S1. Electronically Signed   By: Genevie Ann M.D.   On: 08/12/2020 13:33    Discharge Instructions    Incentive spirometry RT   Complete by: As directed        Follow-up Information    Schedule an appointment as soon as possible for a visit with Marybelle Killings, MD.   Specialty: Orthopedic Surgery Why: need return office visit one week postop.   call to schedule appointment.  Contact information: [address] [place] [address] [phone]               Discharge Plan:  discharge to home  Disposition:     Signed: Benjiman Core  09/06/2020, 10:40 AM

## 2020-09-24 ENCOUNTER — Encounter: Payer: Self-pay | Admitting: Orthopaedic Surgery

## 2020-09-24 ENCOUNTER — Ambulatory Visit (INDEPENDENT_AMBULATORY_CARE_PROVIDER_SITE_OTHER): Payer: Medicare Other | Admitting: Orthopaedic Surgery

## 2020-09-24 ENCOUNTER — Other Ambulatory Visit: Payer: Self-pay | Admitting: Internal Medicine

## 2020-09-24 ENCOUNTER — Telehealth: Payer: Self-pay | Admitting: Internal Medicine

## 2020-09-24 ENCOUNTER — Other Ambulatory Visit: Payer: Self-pay

## 2020-09-24 VITALS — BP 178/75 | Ht 61.5 in | Wt 128.0 lb

## 2020-09-24 DIAGNOSIS — Z9889 Other specified postprocedural states: Secondary | ICD-10-CM

## 2020-09-24 NOTE — Telephone Encounter (Signed)
LVM for pt to rtn my call to schedule AWV with [facility]. Please schedule this appt if pt calls the office.  °

## 2020-09-24 NOTE — Progress Notes (Signed)
Post-Op Visit Note   Patient: Tracy Hammond           Date of Birth: 1941-07-07           MRN: [id number] Visit Date: 09/24/2020 PCP: Hoyt Koch, MD   Assessment & Plan: Follow-up decompression surgery.  She is walking much better legs feel good.  She does have some right hip osteoarthritis and is followed by Dr. Zollie Beckers for this.  She is off her pain medication just using occasional ibuprofen at night.  She can return as needed.  Lumbar incision looks good she is applying lotion.  Chief Complaint:  Chief Complaint  Patient presents with  . Lower Back - Follow-up    08/12/2020 L4-5 decompression, left L5-S1 microdiscectomy   Visit Diagnoses:  1. Status post lumbar spine surgery for decompression of spinal cord     Plan: Patient is happy with the results of lumbar surgery.  When her right hip symptoms progress she can follow-up with Dr. Ninfa Linden to discuss total hip arthroplasty.  Follow-Up Instructions: Return if symptoms worsen or fail to improve.   Orders:  No orders of the defined types were placed in this encounter.  No orders of the defined types were placed in this encounter.   Imaging: No results found.  PMFS History: Patient Active Problem List   Diagnosis Date Noted  . Status post lumbar spine surgery for decompression of spinal cord 08/27/2020  . Spinal stenosis of lumbar region 03/19/2020  . Unilateral primary osteoarthritis, right hip 12/29/2017  . Rash 10/29/2017  . Smokers' cough ([place]) 11/13/2016  . Routine general medical examination at a health care facility 09/05/2015  . Diarrhea 03/15/2015  . Restless leg syndrome 08/28/2014  . VARICOSE VEINS, LOWER EXTREMITIES 05/14/2008  . Hyperlipidemia associated with type 2 diabetes mellitus ([place]) 04/14/2007  . Diabetes mellitus type 2 with complications ([place]) 89/21/1941  . Essential hypertension 03/29/2007   Past Medical History:  Diagnosis Date  . Allergy    seasonal  . Arthritis     fingers, back  . Bursitis   . Cancer ([place])    skin cancer arm and face  . Cataract    surgery  . Cough    smokers  . Diabetes mellitus   . GERD (gastroesophageal reflux disease)   . Glaucoma   . Hyperlipidemia    no meds currently 01/22/20  . Hypertension   . Personal history of colonic adenoma 06/12/2008  . Restless leg   . Thyroid disease    young adult-no meds now    Family History  Problem Relation Age of Onset  . Hypertension Father        family hx  . Diabetes Mother   . Kidney failure Mother   . Heart disease Mother   . Stroke Sister   . Other Sister        Leg Disease  . Heart disease Son   . Colon cancer Neg Hx   . Esophageal cancer Neg Hx   . Rectal cancer Neg Hx   . Stomach cancer Neg Hx     Past Surgical History:  Procedure Laterality Date  . Bladder Tact    . CATARACT EXTRACTION  09-07-2012   rt eye  . CATARACT EXTRACTION Right 08/2012  . CHOLECYSTECTOMY    . COLONOSCOPY    . EYE SURGERY    . LUMBAR LAMINECTOMY/DECOMPRESSION MICRODISCECTOMY N/A 08/12/2020   Procedure: LUMBAR FOUR-FIVE DECOMPRESSION, LEFT LUMBAR FIVE-SACRAL ONE MICRODISCECTOMY;  Surgeon: Rodell Perna  C, MD;  Location: [facility];  Service: Orthopedics;  Laterality: N/A;  . SKIN CANCER EXCISION    . TONSILLECTOMY AND ADENOIDECTOMY    . TUBAL LIGATION     Social History   Occupational History  . Occupation: housewife  Tobacco Use  . Smoking status: Current Every Day Smoker    Packs/day: 0.50    Years: 60.00    Pack years: 30.00    Types: Cigarettes  . Smokeless tobacco: Never Used  Vaping Use  . Vaping Use: Never used  Substance and Sexual Activity  . Alcohol use: No  . Drug use: No  . Sexual activity: Not on file

## 2020-09-26 ENCOUNTER — Other Ambulatory Visit: Payer: Self-pay

## 2020-09-26 ENCOUNTER — Ambulatory Visit (INDEPENDENT_AMBULATORY_CARE_PROVIDER_SITE_OTHER): Payer: Medicare Other | Admitting: Internal Medicine

## 2020-09-26 ENCOUNTER — Encounter: Payer: Self-pay | Admitting: Internal Medicine

## 2020-09-26 VITALS — BP 132/70 | HR 67 | Temp 98.2°F | Resp 18 | Ht 61.5 in | Wt 128.6 lb

## 2020-09-26 DIAGNOSIS — I10 Essential (primary) hypertension: Secondary | ICD-10-CM

## 2020-09-26 DIAGNOSIS — Z0000 Encounter for general adult medical examination without abnormal findings: Secondary | ICD-10-CM | POA: Diagnosis not present

## 2020-09-26 DIAGNOSIS — E1169 Type 2 diabetes mellitus with other specified complication: Secondary | ICD-10-CM

## 2020-09-26 DIAGNOSIS — E118 Type 2 diabetes mellitus with unspecified complications: Secondary | ICD-10-CM | POA: Diagnosis not present

## 2020-09-26 DIAGNOSIS — J410 Simple chronic bronchitis: Secondary | ICD-10-CM

## 2020-09-26 DIAGNOSIS — E785 Hyperlipidemia, unspecified: Secondary | ICD-10-CM

## 2020-09-26 MED ORDER — TRAZODONE HCL 50 MG PO TABS: 50.0000 mg | ORAL_TABLET | Freq: Every evening | ORAL | 3 refills | Status: DC | PRN

## 2020-09-26 NOTE — Assessment & Plan Note (Signed)
Recent lipid panel reviewed. Prior muscle aches with statin.

## 2020-09-26 NOTE — Patient Instructions (Signed)
We have sent in trazodone to help with sleep to take about 30 minutes before bedtime.  Health Maintenance, Female Adopting a healthy lifestyle and getting preventive care are important in promoting health and wellness. Ask your health care provider about:  The right schedule for you to have regular tests and exams.  Things you can do on your own to prevent diseases and keep yourself healthy. What should I know about diet, weight, and exercise? Eat a healthy diet  Eat a diet that includes plenty of vegetables, fruits, low-fat dairy products, and lean protein.  Do not eat a lot of foods that are high in solid fats, added sugars, or sodium.   Maintain a healthy weight Body mass index (BMI) is used to identify weight problems. It estimates body fat based on height and weight. Your health care provider can help determine your BMI and help you achieve or maintain a healthy weight. Get regular exercise Get regular exercise. This is one of the most important things you can do for your health. Most adults should:  Exercise for at least 150 minutes each week. The exercise should increase your heart rate and make you sweat (moderate-intensity exercise).  Do strengthening exercises at least twice a week. This is in addition to the moderate-intensity exercise.  Spend less time sitting. Even light physical activity can be beneficial. Watch cholesterol and blood lipids Have your blood tested for lipids and cholesterol at 78 years of age, then have this test every 5 years. Have your cholesterol levels checked more often if:  Your lipid or cholesterol levels are high.  You are older than 78 years of age.  You are at high risk for heart disease. What should I know about cancer screening? Depending on your health history and family history, you may need to have cancer screening at various ages. This may include screening for:  Breast cancer.  Cervical cancer.  Colorectal cancer.  Skin  cancer.  Lung cancer. What should I know about heart disease, diabetes, and high blood pressure? Blood pressure and heart disease  High blood pressure causes heart disease and increases the risk of stroke. This is more likely to develop in people who have high blood pressure readings, are of African descent, or are overweight.  Have your blood pressure checked: ? Every 3-5 years if you are 4-54 years of age. ? Every year if you are 25 years old or older. Diabetes Have regular diabetes screenings. This checks your fasting blood sugar level. Have the screening done:  Once every three years after age 54 if you are at a normal weight and have a low risk for diabetes.  More often and at a younger age if you are overweight or have a high risk for diabetes. What should I know about preventing infection? Hepatitis B If you have a higher risk for hepatitis B, you should be screened for this virus. Talk with your health care provider to find out if you are at risk for hepatitis B infection. Hepatitis C Testing is recommended for:  Everyone born from [birth year] through 1963.  Anyone with known risk factors for hepatitis C. Sexually transmitted infections (STIs)  Get screened for STIs, including gonorrhea and chlamydia, if: ? You are sexually active and are younger than 78 years of age. ? You are older than 78 years of age and your health care provider tells you that you are at risk for this type of infection. ? Your sexual activity has changed since you were  last screened, and you are at increased risk for chlamydia or gonorrhea. Ask your health care provider if you are at risk.  Ask your health care provider about whether you are at high risk for HIV. Your health care provider may recommend a prescription medicine to help prevent HIV infection. If you choose to take medicine to prevent HIV, you should first get tested for HIV. You should then be tested every 3 months for as long as you are taking  the medicine. Pregnancy  If you are about to stop having your period (premenopausal) and you may become pregnant, seek counseling before you get pregnant.  Take 400 to 800 micrograms (mcg) of folic acid every day if you become pregnant.  Ask for birth control (contraception) if you want to prevent pregnancy. Osteoporosis and menopause Osteoporosis is a disease in which the bones lose minerals and strength with aging. This can result in bone fractures. If you are 83 years old or older, or if you are at risk for osteoporosis and fractures, ask your health care provider if you should:  Be screened for bone loss.  Take a calcium or vitamin D supplement to lower your risk of fractures.  Be given hormone replacement therapy (HRT) to treat symptoms of menopause. Follow these instructions at home: Lifestyle  Do not use any products that contain nicotine or tobacco, such as cigarettes, e-cigarettes, and chewing tobacco. If you need help quitting, ask your health care provider.  Do not use street drugs.  Do not share needles.  Ask your health care provider for help if you need support or information about quitting drugs. Alcohol use  Do not drink alcohol if: ? Your health care provider tells you not to drink. ? You are pregnant, may be pregnant, or are planning to become pregnant.  If you drink alcohol: ? Limit how much you use to 0-1 drink a day. ? Limit intake if you are breastfeeding.  Be aware of how much alcohol is in your drink. In the [place] one drink equals one 12 oz bottle of beer (355 mL), one 5 oz glass of wine (148 mL), or one 1 oz glass of hard liquor (44 mL). General instructions  Schedule regular health, dental, and eye exams.  Stay current with your vaccines.  Tell your health care provider if: ? You often feel depressed. ? You have ever been abused or do not feel safe at home. Summary  Adopting a healthy lifestyle and getting preventive care are important in  promoting health and wellness.  Follow your health care provider's instructions about healthy diet, exercising, and getting tested or screened for diseases.  Follow your health care provider's instructions on monitoring your cholesterol and blood pressure. This information is not intended to replace advice given to you by your health care provider. Make sure you discuss any questions you have with your health care provider. Document Revised: 08/10/2018 Document Reviewed: 08/10/2018 Elsevier Patient Education  2021 [employer].

## 2020-09-26 NOTE — Assessment & Plan Note (Signed)
Stable lung exam, does not feel able to make attempt to quit at this time. Reminded about harm from cigarette smoking.

## 2020-09-26 NOTE — Progress Notes (Signed)
Subjective:   Patient ID: Tracy Hammond, female    DOB: September 29, 1940, 80 y.o.   MRN: [id number]  HPI Here for medicare wellness and physical, no new complaints. Please see A/P for status and treatment of chronic medical problems.   Diet: DM since diabetic Physical activity: sedentary Depression/mood screen: negative Hearing: intact to whispered voice, moderate loss left ear Visual acuity: grossly normal, performs annual eye exam  ADLs: capable Fall risk: none Home safety: good Cognitive evaluation: intact to orientation, naming, recall and repetition EOL planning: adv directives discussed  [place] Visit from 09/26/2020 in [facility] Total Score 0       I have personally reviewed and have noted 1. The patient's medical and social history - reviewed today no changes 2. Their use of alcohol, tobacco or illicit drugs 3. Their current medications and supplements 4. The patient's functional ability including ADL's, fall risks, home safety risks and hearing or visual impairment. 5. Diet and physical activities 6. Evidence for depression or mood disorders 7. Care team reviewed and updated  Patient Care Team: Hoyt Koch, MD as PCP - General (Internal Medicine) Buford Dresser, MD as PCP - Cardiology (Cardiology) Past Medical History:  Diagnosis Date  . Allergy    seasonal  . Arthritis    fingers, back  . Bursitis   . Cancer ([place])    skin cancer arm and face  . Cataract    surgery  . Cough    smokers  . Diabetes mellitus   . GERD (gastroesophageal reflux disease)   . Glaucoma   . Hyperlipidemia    no meds currently 01/22/20  . Hypertension   . Personal history of colonic adenoma 06/12/2008  . Restless leg   . Thyroid disease    young adult-no meds now   Past Surgical History:  Procedure Laterality Date  . Bladder Tact    . CATARACT EXTRACTION  09-07-2012   rt eye  . CATARACT EXTRACTION Right 08/2012  .  CHOLECYSTECTOMY    . COLONOSCOPY    . EYE SURGERY    . LUMBAR LAMINECTOMY/DECOMPRESSION MICRODISCECTOMY N/A 08/12/2020   Procedure: LUMBAR FOUR-FIVE DECOMPRESSION, LEFT LUMBAR FIVE-SACRAL ONE MICRODISCECTOMY;  Surgeon: Marybelle Killings, MD;  Location: [facility];  Service: Orthopedics;  Laterality: N/A;  . SKIN CANCER EXCISION    . TONSILLECTOMY AND ADENOIDECTOMY    . TUBAL LIGATION     Family History  Problem Relation Age of Onset  . Hypertension Father        family hx  . Diabetes Mother   . Kidney failure Mother   . Heart disease Mother   . Stroke Sister   . Other Sister        Leg Disease  . Heart disease Son   . Colon cancer Neg Hx   . Esophageal cancer Neg Hx   . Rectal cancer Neg Hx   . Stomach cancer Neg Hx     Review of Systems  Constitutional: Negative.   HENT: Negative.   Eyes: Negative.   Respiratory: Negative for cough, chest tightness and shortness of breath.   Cardiovascular: Negative for chest pain, palpitations and leg swelling.  Gastrointestinal: Negative for abdominal distention, abdominal pain, constipation, diarrhea, nausea and vomiting.  Musculoskeletal: Negative.   Skin: Negative.   Neurological: Negative.   Psychiatric/Behavioral: Positive for sleep disturbance.    Objective:  Physical Exam Constitutional:      Appearance: She is well-developed and well-nourished.  HENT:     Head: Normocephalic and atraumatic.  Eyes:     Extraocular Movements: EOM normal.  Cardiovascular:     Rate and Rhythm: Normal rate and regular rhythm.  Pulmonary:     Effort: Pulmonary effort is normal. No respiratory distress.     Breath sounds: Normal breath sounds. No wheezing or rales.  Abdominal:     General: Bowel sounds are normal. There is no distension.     Palpations: Abdomen is soft.     Tenderness: There is no abdominal tenderness. There is no rebound.  Musculoskeletal:        General: No edema.     Cervical back: Normal range of motion.  Skin:    General:  Skin is warm and dry.     Comments: Foot exam done  Neurological:     Mental Status: She is alert and oriented to person, place, and time.     Coordination: Coordination normal.  Psychiatric:        Mood and Affect: Mood and affect normal.     Vitals:   09/26/20 0920  BP: 132/70  Pulse: 67  Resp: 18  Temp: 98.2 F (36.8 C)  TempSrc: Oral  SpO2: 97%  Weight: 128 lb 9.6 oz (58.3 kg)  Height: 5' 1.5" (1.562 m)   This visit occurred during the SARS-CoV-2 public health emergency.  Safety protocols were in place, including screening questions prior to the visit, additional usage of staff PPE, and extensive cleaning of exam room while observing appropriate contact time as indicated for disinfecting solutions.   Assessment & Plan:

## 2020-09-26 NOTE — Assessment & Plan Note (Signed)
Flu shot up to date. Covid-19 up to date including booster. Pneumonia complete. Shingrix counseled declines. Tetanus counseled declines. Colonoscopy due 2024. Mammogram aged out future, pap smear aged out and dexa declines further. Counseled about sun safety and mole surveillance. Counseled about the dangers of distracted driving. Given 10 year screening recommendations.

## 2020-09-26 NOTE — Assessment & Plan Note (Signed)
Recent HgA1c at goal. Foot exam done today. Taking glipizide and metformin.

## 2020-09-26 NOTE — Assessment & Plan Note (Signed)
BP at goal on lisinopril/hctz and recent labs without indication for change.

## 2020-09-27 ENCOUNTER — Telehealth: Payer: Self-pay | Admitting: Internal Medicine

## 2020-09-27 MED ORDER — BUSPIRONE HCL 5 MG PO TABS: 5.0000 mg | ORAL_TABLET | Freq: Every day | ORAL | 0 refills | Status: DC | PRN

## 2020-09-27 NOTE — Telephone Encounter (Signed)
Sent in buspar to try for anxiety as xanax is not a good option for her.

## 2020-09-27 NOTE — Telephone Encounter (Signed)
I dont see where Xanax is listed on her medication list.

## 2020-09-27 NOTE — Telephone Encounter (Signed)
   Daughter Tracy Hammond calling to request Xanax for her mother. She states her mother is very emotional because her spouse is hospitalized  [place], [place] - [place]

## 2020-10-29 ENCOUNTER — Telehealth: Payer: Self-pay | Admitting: Internal Medicine

## 2020-10-29 MED ORDER — POTASSIUM CHLORIDE CRYS ER 20 MEQ PO TBCR: 20.0000 meq | EXTENDED_RELEASE_TABLET | Freq: Every day | ORAL | 0 refills | Status: DC

## 2020-10-29 MED ORDER — FUROSEMIDE 20 MG PO TABS: 20.0000 mg | ORAL_TABLET | Freq: Every day | ORAL | 0 refills | Status: DC

## 2020-10-29 NOTE — Telephone Encounter (Addendum)
The daughter [name] is on DPR) of patient calling upset and verbally abusive she states her mother has swollen feet.  Declined appointment Declined to to answer any additional questions to help process the call. Daughter states her mother has been stressed due to death of spouse and has been on her feet a lot  Seeking advice, requesting call form CMA, call patient

## 2020-10-29 NOTE — Telephone Encounter (Signed)
Sent in lasix 20 mg daily and she should take 1 pill daily and sent in refill of potassium to take 1 pill daily along with lasix.

## 2020-10-29 NOTE — Telephone Encounter (Signed)
Patients daughter calling back yelling because no one has called. I asked if there is something more I can included in the message and they said its just about her swelling in her calf and feet. I offered an appointment because its been over a month since Dr. Sharlet Salina saw her and they still declined.

## 2020-10-29 NOTE — Telephone Encounter (Signed)
Spoke with the patient's daughter [name] and she stated that ever since her father passed on 10-21-20 her mother has constantly been on her feet. No one is able to get her to sit down long enough to elevate her feet. Bilateral swelling to the legs. Patient buried her husband this past Saturday. [name] is wondering could a low dose of Lasix be called into [facility] on [address]. Please advise

## 2020-11-05 DIAGNOSIS — H35371 Puckering of macula, right eye: Secondary | ICD-10-CM | POA: Diagnosis not present

## 2020-11-05 DIAGNOSIS — H43813 Vitreous degeneration, bilateral: Secondary | ICD-10-CM | POA: Diagnosis not present

## 2020-11-05 DIAGNOSIS — H353221 Exudative age-related macular degeneration, left eye, with active choroidal neovascularization: Secondary | ICD-10-CM | POA: Diagnosis not present

## 2020-11-05 DIAGNOSIS — H353213 Exudative age-related macular degeneration, right eye, with inactive scar: Secondary | ICD-10-CM | POA: Diagnosis not present

## 2020-11-13 DIAGNOSIS — Z1231 Encounter for screening mammogram for malignant neoplasm of breast: Secondary | ICD-10-CM | POA: Diagnosis not present

## 2020-11-26 ENCOUNTER — Other Ambulatory Visit: Payer: Self-pay | Admitting: Internal Medicine

## 2020-12-16 DIAGNOSIS — H401114 Primary open-angle glaucoma, right eye, indeterminate stage: Secondary | ICD-10-CM | POA: Diagnosis not present

## 2020-12-16 DIAGNOSIS — H401123 Primary open-angle glaucoma, left eye, severe stage: Secondary | ICD-10-CM | POA: Diagnosis not present

## 2020-12-19 ENCOUNTER — Other Ambulatory Visit: Payer: Self-pay

## 2020-12-19 ENCOUNTER — Ambulatory Visit (INDEPENDENT_AMBULATORY_CARE_PROVIDER_SITE_OTHER): Payer: Medicare Other | Admitting: Internal Medicine

## 2020-12-19 ENCOUNTER — Encounter: Payer: Self-pay | Admitting: Internal Medicine

## 2020-12-19 VITALS — BP 126/76 | HR 70 | Temp 98.3°F | Resp 18 | Ht 61.5 in | Wt 125.8 lb

## 2020-12-19 DIAGNOSIS — F4325 Adjustment disorder with mixed disturbance of emotions and conduct: Secondary | ICD-10-CM

## 2020-12-19 DIAGNOSIS — I8390 Asymptomatic varicose veins of unspecified lower extremity: Secondary | ICD-10-CM

## 2020-12-19 DIAGNOSIS — E118 Type 2 diabetes mellitus with unspecified complications: Secondary | ICD-10-CM | POA: Diagnosis not present

## 2020-12-19 DIAGNOSIS — L989 Disorder of the skin and subcutaneous tissue, unspecified: Secondary | ICD-10-CM

## 2020-12-19 MED ORDER — POTASSIUM CHLORIDE CRYS ER 20 MEQ PO TBCR: 20.0000 meq | EXTENDED_RELEASE_TABLET | Freq: Every day | ORAL | 1 refills | Status: AC

## 2020-12-19 MED ORDER — TRAZODONE HCL 50 MG PO TABS: 50.0000 mg | ORAL_TABLET | Freq: Every evening | ORAL | 3 refills | Status: AC | PRN

## 2020-12-19 MED ORDER — FUROSEMIDE 20 MG PO TABS
20.0000 mg | ORAL_TABLET | Freq: Every day | ORAL | 1 refills | Status: AC
Start: 2020-12-19 — End: ?

## 2020-12-19 NOTE — Patient Instructions (Signed)
We will have you take only 1 metformin instead of 2 a day.   We have sent in lasix and potassium to take daily for fluid.  You can take up to 2 trazodone at night time to help with sleep.

## 2020-12-19 NOTE — Progress Notes (Signed)
   Subjective:   Patient ID: Tracy Hammond, female    DOB: 1941-01-30, 80 y.o.   MRN: [id number]  HPI The patient is a 80 YO female coming in for several concerns including scalp lesions (sores on the scalp and hairline, some itching, for a long time and overall progressive, prior skin cancers and does not have current dermatologist, also around her forehead similar lesions) and leg swelling (swelling for awhile, is worse recently as she is trying to move around and not sitting, no time to elevate her legs, cannot get on compression hose, used lasix for a week to try and this helped without excessive urination, denies pain in calf or swelling more one leg over another) and recent loss of spouse (in the last month or two, she is still having a lot of difficulty, using rare xanax, overall sad and crying daily, finding things to do which helps, denies SI/HI, has son staying with her which is helping).   Review of Systems  Constitutional: Negative.   HENT: Negative.   Eyes: Negative.   Respiratory: Negative for cough, chest tightness and shortness of breath.   Cardiovascular: Positive for leg swelling. Negative for chest pain and palpitations.  Gastrointestinal: Negative for abdominal distention, abdominal pain, constipation, diarrhea, nausea and vomiting.  Musculoskeletal: Negative.   Skin: Positive for color change.  Neurological: Negative.   Psychiatric/Behavioral: Positive for decreased concentration, dysphoric mood and sleep disturbance. The patient is nervous/anxious.     Objective:  Physical Exam Constitutional:      Appearance: She is well-developed.  HENT:     Head: Normocephalic and atraumatic.  Cardiovascular:     Rate and Rhythm: Normal rate and regular rhythm.  Pulmonary:     Effort: Pulmonary effort is normal. No respiratory distress.     Breath sounds: Normal breath sounds. No wheezing or rales.  Abdominal:     General: Bowel sounds are normal. There is no distension.      Palpations: Abdomen is soft.     Tenderness: There is no abdominal tenderness. There is no rebound.  Musculoskeletal:     Cervical back: Normal range of motion.     Right lower leg: Edema present.     Left lower leg: Edema present.     Comments: Stable 1-2+ edema  Skin:    General: Skin is warm and dry.     Comments: Lesions scalp, forehead  Neurological:     Mental Status: She is alert and oriented to person, place, and time.     Coordination: Coordination normal.     Vitals:   12/19/20 0934  BP: 126/76  Pulse: 70  Resp: 18  Temp: 98.3 F (36.8 C)  TempSrc: Oral  SpO2: 98%  Weight: 125 lb 12.8 oz (57.1 kg)  Height: 5' 1.5" (1.562 m)   This visit occurred during the SARS-CoV-2 public health emergency.  Safety protocols were in place, including screening questions prior to the visit, additional usage of staff PPE, and extensive cleaning of exam room while observing appropriate contact time as indicated for disinfecting solutions.   Assessment & Plan:  Visit time 25 minutes in face to face communication with patient and coordination of care, additional 15 minutes spent in record review, coordination or care, ordering tests, communicating/referring to other healthcare professionals, documenting in medical records all on the same day of the visit for total time 40 minutes spent on the visit.

## 2020-12-20 DIAGNOSIS — L989 Disorder of the skin and subcutaneous tissue, unspecified: Secondary | ICD-10-CM | POA: Insufficient documentation

## 2020-12-20 DIAGNOSIS — F4320 Adjustment disorder, unspecified: Secondary | ICD-10-CM | POA: Insufficient documentation

## 2020-12-20 NOTE — Assessment & Plan Note (Addendum)
Loss of spouse is significant. Refill alprazolam short term. Advised this will be big adjustment and not easy. Offered counseling.

## 2020-12-20 NOTE — Assessment & Plan Note (Signed)
Given significant diarrhea will reduce metformin to 1 pill daily instead of BID and follow up 2-3 months to recheck HgA1c.

## 2020-12-20 NOTE — Assessment & Plan Note (Signed)
Referral to dermatology.  

## 2020-12-20 NOTE — Assessment & Plan Note (Signed)
Rx lasix and potassium to help with swelling. Normal recent renal function. Will need close monitoring and will recheck at follow up.

## 2020-12-23 ENCOUNTER — Other Ambulatory Visit: Payer: Self-pay | Admitting: Internal Medicine

## 2020-12-23 ENCOUNTER — Telehealth: Payer: Self-pay | Admitting: Internal Medicine

## 2020-12-23 MED ORDER — ALPRAZOLAM 0.25 MG PO TABS
0.2500 mg | ORAL_TABLET | Freq: Two times a day (BID) | ORAL | 0 refills | Status: DC | PRN
Start: 2020-12-23 — End: 2021-04-03

## 2020-12-23 NOTE — Telephone Encounter (Signed)
Patients daughter calling, states that the patient is now seeping fluid from both legs and the swelling is still the same. Patient did not take lasix this morning because they wanted to see what Dr. Sharlet Salina said.

## 2020-12-23 NOTE — Telephone Encounter (Signed)
Spoke with the patient's daughter and she stated the patient has been taking her lasix since her visit. She did not take it this morning.

## 2020-12-23 NOTE — Telephone Encounter (Signed)
Has she been taking lasix since visit?

## 2020-12-23 NOTE — Telephone Encounter (Signed)
If they want to try taking 2 lasix for 2-3 days then go back to 1 pill daily that may help.

## 2020-12-23 NOTE — Telephone Encounter (Signed)
See below

## 2020-12-23 NOTE — Telephone Encounter (Signed)
Spoke with the the patient's daughter and she verbalized understanding Dr. Nathanial Millman recommendations. No other question or concerns.

## 2021-01-01 ENCOUNTER — Telehealth: Payer: Self-pay | Admitting: Dermatology

## 2021-01-01 NOTE — Telephone Encounter (Signed)
See below

## 2021-01-01 NOTE — Telephone Encounter (Signed)
We could send in compression stockings if they want?

## 2021-01-01 NOTE — Telephone Encounter (Signed)
Referral attached to appointment

## 2021-01-01 NOTE — Telephone Encounter (Signed)
Patient is calling for a referral appointment from Pricilla Holm, MD.  Patient is scheduled for 06/18/2021 at 10:30 with Lavonna Monarch, MD.

## 2021-01-01 NOTE — Telephone Encounter (Signed)
Patients daughter calling, states the patients legs are still seeping fluid and she cant go out because it makes her pants and shoes soaking wet.   [name] [phone]

## 2021-01-02 NOTE — Telephone Encounter (Signed)
Follow up message    Patient's daughter called back, patient DOES NOT need stocking. She found a pair at home. No new order needed

## 2021-01-02 NOTE — Telephone Encounter (Signed)
Fyi, patient has compression stockings.

## 2021-01-02 NOTE — Telephone Encounter (Signed)
Patients daughter calling back, states stockings are fine. Please call patients daughter back to let her know where we are sending them to

## 2021-01-03 NOTE — Telephone Encounter (Signed)
Noted  

## 2021-01-13 ENCOUNTER — Other Ambulatory Visit: Payer: Self-pay

## 2021-01-13 ENCOUNTER — Encounter: Payer: Self-pay | Admitting: Internal Medicine

## 2021-01-13 ENCOUNTER — Ambulatory Visit (INDEPENDENT_AMBULATORY_CARE_PROVIDER_SITE_OTHER): Payer: Medicare Other | Admitting: Internal Medicine

## 2021-01-13 VITALS — BP 128/70 | HR 73 | Temp 98.4°F | Resp 18 | Ht 61.5 in | Wt 128.6 lb

## 2021-01-13 DIAGNOSIS — R0602 Shortness of breath: Secondary | ICD-10-CM

## 2021-01-13 DIAGNOSIS — I872 Venous insufficiency (chronic) (peripheral): Secondary | ICD-10-CM

## 2021-01-13 LAB — CBC
HCT: 41.9 % (ref 36.0–46.0)
Hemoglobin: 14 g/dL (ref 12.0–15.0)
MCHC: 33.4 g/dL (ref 30.0–36.0)
MCV: 93 fl (ref 78.0–100.0)
Platelets: 273 10*3/uL (ref 150.0–400.0)
RBC: 4.51 Mil/uL (ref 3.87–5.11)
RDW: 13.9 % (ref 11.5–15.5)
WBC: 11.5 10*3/uL — ABNORMAL HIGH (ref 4.0–10.5)

## 2021-01-13 LAB — COMPREHENSIVE METABOLIC PANEL
ALT: 25 U/L (ref 0–35)
AST: 38 U/L — ABNORMAL HIGH (ref 0–37)
Albumin: 3.8 g/dL (ref 3.5–5.2)
Alkaline Phosphatase: 200 U/L — ABNORMAL HIGH (ref 39–117)
BUN: 17 mg/dL (ref 6–23)
CO2: 29 mEq/L (ref 19–32)
Calcium: 9.7 mg/dL (ref 8.4–10.5)
Chloride: 103 mEq/L (ref 96–112)
Creatinine, Ser: 0.55 mg/dL (ref 0.40–1.20)
GFR: 88.23 mL/min (ref >60.00)
Glucose, Bld: 85 mg/dL (ref 70–99)
Potassium: 3.5 mEq/L (ref 3.5–5.1)
Sodium: 141 mEq/L (ref 135–145)
Total Bilirubin: 0.9 mg/dL (ref 0.2–1.2)
Total Protein: 6.6 g/dL (ref 6.0–8.3)

## 2021-01-13 LAB — BRAIN NATRIURETIC PEPTIDE: Pro B Natriuretic peptide (BNP): 351 pg/mL — ABNORMAL HIGH (ref 0.0–100.0)

## 2021-01-13 MED ORDER — LISINOPRIL-HYDROCHLOROTHIAZIDE 20-12.5 MG PO TABS: 1.0000 | ORAL_TABLET | Freq: Every day | ORAL | 3 refills | Status: AC

## 2021-01-13 NOTE — Progress Notes (Signed)
   Subjective:   Patient ID: Tracy Hammond, female    DOB: 1941-07-19, 80 y.o.   MRN: [id number]  HPI The patient is a 80 YO female coming in for ongoing leg swelling. Recent ECHO 03/2020 with grade 1 diastolic dysfunction but otherwise normal. Denies SOB. Denies pain with the swelling. There is a little soreness to touch. Some weeping of the right leg. They have compression stockings but it is unclear if using daily. Taking lasix daily and this is making her urinate but not really helping her swelling. Is going through a lot of grief with recent loss of husband.   Review of Systems  Constitutional: Negative.   HENT: Negative.   Eyes: Negative.   Respiratory: Negative for cough, chest tightness and shortness of breath.   Cardiovascular: Positive for leg swelling. Negative for chest pain and palpitations.  Gastrointestinal: Negative for abdominal distention, abdominal pain, constipation, diarrhea, nausea and vomiting.  Musculoskeletal: Negative.   Skin: Negative.   Neurological: Negative.   Psychiatric/Behavioral: Positive for decreased concentration, dysphoric mood and sleep disturbance.    Objective:  Physical Exam Constitutional:      Appearance: She is well-developed.  HENT:     Head: Normocephalic and atraumatic.  Cardiovascular:     Rate and Rhythm: Normal rate and regular rhythm.  Pulmonary:     Effort: Pulmonary effort is normal. No respiratory distress.     Breath sounds: Normal breath sounds. No wheezing or rales.  Abdominal:     General: Bowel sounds are normal. There is no distension.     Palpations: Abdomen is soft.     Tenderness: There is no abdominal tenderness. There is no rebound.  Musculoskeletal:     Cervical back: Normal range of motion.     Right lower leg: Edema present.     Left lower leg: Edema present.     Comments: Pitting edema with weeping, no ulcers  Skin:    General: Skin is warm and dry.  Neurological:     Mental Status: She is alert and  oriented to person, place, and time.     Coordination: Coordination normal.     Vitals:   01/13/21 1101  BP: 128/70  Pulse: 73  Resp: 18  SpO2: 98%  Weight: 128 lb 9.6 oz (58.3 kg)  Height: 5' 1.5" (1.562 m)    This visit occurred during the SARS-CoV-2 public health emergency.  Safety protocols were in place, including screening questions prior to the visit, additional usage of staff PPE, and extensive cleaning of exam room while observing appropriate contact time as indicated for disinfecting solutions.   Assessment & Plan:

## 2021-01-13 NOTE — Patient Instructions (Addendum)
We will check the labs today and if normal we will check an ultrasound of the legs.   Work on wearing the compression stockings daily and drinking plenty of water to help.  Keep taking the lasix 20 mg daily.

## 2021-01-14 ENCOUNTER — Telehealth: Payer: Self-pay | Admitting: Internal Medicine

## 2021-01-14 NOTE — Chronic Care Management (AMB) (Signed)
  Chronic Care Management   Note  01/14/2021 Name: Tracy Hammond MRN: [id number] DOB: 1941-07-22  Tracy Hammond is a 80 y.o. year old female who is a primary care patient of Hoyt Koch, MD. I reached out to [place] by phone today in response to a referral sent by [name]'s PCP, Hoyt Koch, MD.   [name] was given information about Chronic Care Management services today including:  1. CCM service includes personalized support from designated clinical staff supervised by her physician, including individualized plan of care and coordination with other care providers 2. 24/7 contact phone numbers for assistance for urgent and routine care needs. 3. Service will only be billed when office clinical staff spend 20 minutes or more in a month to coordinate care. 4. Only one practitioner may furnish and bill the service in a calendar month. 5. The patient may stop CCM services at any time (effective at the end of the month) by phone call to the office staff.   Patient agreed to services and verbal consent obtained.   Follow up plan:   Lauretta Grill [employer] Scheduler

## 2021-01-15 NOTE — Assessment & Plan Note (Signed)
Checking BNP and CMP and CBC to evaluate for secondary cause of the swelling. She has had varicosity treated in the past about 3-4 years ago. Continue lasix for swelling and advised to use compression stockings daily, increase water intake and elevate legs. Reduce sodium intake.

## 2021-01-16 NOTE — Progress Notes (Signed)
Chronic Care Management Pharmacy Note  01/21/2021 Name:  Tracy Hammond MRN:  [id number] DOB:  [birth year]  Subjective: Tracy Hammond is an 80 y.o. year old female who is a primary patient of Hoyt Koch, MD.  The CCM team was consulted for assistance with disease management and care coordination needs.    Engaged with patient face to face for initial visit in response to provider referral for pharmacy case management and/or care coordination services.   Consent to Services:  The patient was given the following information about Chronic Care Management services today, agreed to services, and gave verbal consent: 1. CCM service includes personalized support from designated clinical staff supervised by the primary care provider, including individualized plan of care and coordination with other care providers 2. 24/7 contact phone numbers for assistance for urgent and routine care needs. 3. Service will only be billed when office clinical staff spend 20 minutes or more in a month to coordinate care. 4. Only one practitioner may furnish and bill the service in a calendar month. 5.The patient may stop CCM services at any time (effective at the end of the month) by phone call to the office staff. 6. The patient will be responsible for cost sharing (co-pay) of up to 20% of the service fee (after annual deductible is met). Patient agreed to services and consent obtained.  Patient Care Team: Hoyt Koch, MD as PCP - General (Internal Medicine) Buford Dresser, MD as PCP - Cardiology (Cardiology) Tomasa Blase, [facility] as Pharmacist (Pharmacist)  Recent office visits: 01/13/2021 - PCP visit - ongoing edema of legs - ECHO 03/2020 with grade 1 diastolic dysfunction but otherwise normal - advised for use of compression socks, avoidance of sodium, and continuing lasix   12/19/2020 -PCP visit - edema of legs, recent loss of husband - metformin reduced to 1 tablet daily, lasix and  klor-con continued, trazodone - up to 2 tablets nightly for sleep - alprazolam prescription continued for the short term  09/26/2020 - PCP visit-  No changes at this time   Recent consult visits: 12/16/2020 - Ophthalmology - glaucoma evaluation  - doing well continue current medications Dorzolamide-Timolol x2 OU  Brimonidine 0.2% x2 OU - has f/u in June   09/24/2020 - Ortho - follow up with surgeon post surgery - doing well, stopped using pain medications, using occasional IBU at night  08/12/2020 - [facility] L4-5  Hospital visits: None in previous 6 months  Objective:  Lab Results  Component Value Date   CREATININE 0.55 01/13/2021   BUN 17 01/13/2021   GFR 88.23 01/13/2021   GFRNONAA >60 08/08/2020   GFRAA 159 05/14/2008   NA 141 01/13/2021   K 3.5 01/13/2021   CALCIUM 9.7 01/13/2021   CO2 29 01/13/2021   GLUCOSE 85 01/13/2021    Lab Results  Component Value Date/Time   HGBA1C 6.4 07/19/2020 09:05 AM   HGBA1C 7.2 (H) 01/05/2020 11:27 AM   GFR 88.23 01/13/2021 11:35 AM   GFR 85.04 07/19/2020 09:05 AM   MICROALBUR 44.2 (H) 09/20/2019 08:58 AM   MICROALBUR 1.6 09/13/2018 09:25 AM    Last diabetic Eye exam:  Lab Results  Component Value Date/Time   HMDIABEYEEXA No Retinopathy 10/18/2018 12:00 AM    Last diabetic Foot exam:  Lab Results  Component Value Date/Time   HMDIABFOOTEX done 07/26/2013 12:00 AM     Lab Results  Component Value Date   CHOL 112 07/19/2020   HDL 38.90 (L) 07/19/2020  LDLCALC 47 07/19/2020   LDLDIRECT 84.0 09/08/2017   TRIG 129.0 07/19/2020   CHOLHDL 3 07/19/2020    Hepatic Function Latest Ref Rng & Units 01/13/2021 07/19/2020 09/20/2019  Total Protein 6.0 - 8.3 g/dL 6.6 6.7 6.9  Albumin 3.5 - 5.2 g/dL 3.8 4.0 4.2  AST 0 - 37 U/L 38(H) 30 60(H)  ALT 0 - 35 U/L 25 24 49(H)  Alk Phosphatase 39 - 117 U/L 200(H) 88 102  Total Bilirubin 0.2 - 1.2 mg/dL 0.9 0.6 0.5  Bilirubin, Direct 0.0 - 0.3 mg/dL - - -    Lab Results   Component Value Date/Time   TSH 1.86 09/13/2018 09:25 AM   TSH 1.10 07/15/2012 10:21 AM    CBC Latest Ref Rng & Units 01/13/2021 08/08/2020 09/20/2019  WBC 4.0 - 10.5 K/uL 11.5(H) 8.5 9.9  Hemoglobin 12.0 - 15.0 g/dL 14.0 14.0 14.5  Hematocrit 36.0 - 46.0 % 41.9 42.4 43.6  Platelets 150.0 - 400.0 K/uL 273.0 251 231.0    Lab Results  Component Value Date/Time   VD25OH 51.09 09/13/2018 09:25 AM   VD25OH 41.30 09/05/2015 10:04 AM    Clinical ASCVD: No  The ASCVD Risk score Mikey Bussing DC Jr., et al., 2013) failed to calculate for the following reasons:   The valid total cholesterol range is 130 to 320 mg/dL    Depression screen [facility] 2/9 01/13/2021 09/26/2020 09/20/2019  Decreased Interest 0 0 0  Down, Depressed, Hopeless 3 0 0  PHQ - 2 Score 3 0 0  Altered sleeping 3 - -  Tired, decreased energy 3 - -  Change in appetite 3 - -  Feeling bad or failure about yourself  3 - -  Trouble concentrating 0 - -  Moving slowly or fidgety/restless 0 - -  PHQ-9 Score 15 - -  Difficult doing work/chores Extremely dIfficult - -      Social History   Tobacco Use  Smoking Status Current Every Day Smoker  . Packs/day: 0.50  . Years: 60.00  . Pack years: 30.00  . Types: Cigarettes  Smokeless Tobacco Never Used   BP Readings from Last 3 Encounters:  01/13/21 128/70  12/19/20 126/76  09/26/20 132/70   Pulse Readings from Last 3 Encounters:  01/13/21 73  12/19/20 70  09/26/20 67   Wt Readings from Last 3 Encounters:  01/13/21 128 lb 9.6 oz (58.3 kg)  12/19/20 125 lb 12.8 oz (57.1 kg)  09/26/20 128 lb 9.6 oz (58.3 kg)   BMI Readings from Last 3 Encounters:  01/13/21 23.91 kg/m  12/19/20 23.38 kg/m  09/26/20 23.91 kg/m    Assessment/Interventions: Review of patient past medical history, allergies, medications, health status, including review of consultants reports, laboratory and other test data, was performed as part of comprehensive evaluation and provision of chronic care  management services.   SDOH:  (Social Determinants of Health) assessments and interventions performed: Yes  SDOH Screenings   Alcohol Screen: Not on file  Depression (PHQ2-9): Medium Risk  . PHQ-2 Score: 15  Financial Resource Strain: Low Risk   . Difficulty of Paying Living Expenses: Not hard at all  Food Insecurity: Not on file  Housing: Not on file  Physical Activity: Not on file  Social Connections: Not on file  Stress: Not on file  Tobacco Use: High Risk  . Smoking Tobacco Use: Current Every Day Smoker  . Smokeless Tobacco Use: Never Used  Transportation Needs: Not on file    [place]  No Known Allergies  Medications Reviewed Today    Reviewed by Tomasa Blase, [facility] (Pharmacist) on 01/21/21 at 1118  Med List Status: <None>  Medication Order Taking? Sig Documenting Provider Last Dose Status Informant  ALPRAZolam (XANAX) 0.25 MG tablet [id number] No Take 1 tablet (0.25 mg total) by mouth 2 (two) times daily as needed for anxiety.  Patient not taking: Reported on 01/21/2021   Hoyt Koch, MD Not Taking Active   aspirin 81 MG tablet [id number] Yes Take 1 tablet (81 mg total) by mouth every morning. STOP TODAY 11/25, RESTART 08/08/2013  Patient taking differently: Take 81 mg by mouth every morning.   Gatha Mayer, MD Taking Active            Med Note Riki Sheer, Fransisco Hertz   Thu Sep 26, 2020  9:30 AM)    bismuth subsalicylate (PEPTO BISMOL) 262 MG/15ML suspension [id number] Yes Take 30 mLs by mouth every 6 (six) hours as needed. [provider] Taking Active   brimonidine (ALPHAGAN) 0.15 % ophthalmic solution [id number] Yes Place 1 drop into both eyes in the morning and at bedtime. [provider] Taking Active Self           Med Note Kenton Kingfisher, Germaine Pomfret Aug 07, 2020  8:17 AM)    busPIRone (BUSPAR) 5 MG tablet [id number] No Take 1 tablet (5 mg total) by mouth daily as needed (anxiety). Hoyt Koch, MD Unknown Active   Calcium  Carbonate-Vit D-Min (CALCIUM 1200) 1200-1000 MG-UNIT CHEW [id number] Yes Chew 1 tablet by mouth daily. [provider] Taking Active Self  Cyanocobalamin 1500 MCG TBDP [id number] No Take 1,500 mcg by mouth daily.  Patient not taking: Reported on 01/21/2021   [provider] Not Taking Consider Medication Status and Discontinue (Patient Preference) Self  diclofenac sodium (VOLTAREN) 1 % GEL [id number] No APPLY A THIN LAYER TO AFFECTED AREA UP TO 4 TIMES A DAY AS NEEDED  Patient not taking: Reported on 01/21/2021   Mcarthur Rossetti, MD Not Taking Consider Medication Status and Discontinue Self  diphenoxylate-atropine (LOMOTIL) 2.5-0.025 MG tablet [id number] No Take 1 tablet by mouth 4 (four) times daily as needed for diarrhea or loose stools.  Patient not taking: Reported on 01/21/2021   Hoyt Koch, MD Not Taking Active Self  dorzolamide-timolol (COSOPT) 22.3-6.8 MG/ML ophthalmic solution [id number] Yes Place 1 drop into both eyes 2 (two) times daily. [provider] Taking Active Self  furosemide (LASIX) 20 MG tablet [id number] Yes Take 1 tablet (20 mg total) by mouth daily. Hoyt Koch, MD Taking Active   gabapentin (NEURONTIN) 100 MG capsule [id number] No Take 100 mg by mouth at bedtime.  Patient not taking: Reported on 01/21/2021   [provider] Not Taking Consider Medication Status and Discontinue (Patient Preference)   glipiZIDE (GLUCOTROL) 5 MG tablet [id number] No TAKE 1 TABLET BY MOUTH BEFORE BREAKFAST  Patient taking differently: Patient could not recall   Hoyt Koch, MD Unknown Active   IBUPROFEN PO [id number] Yes Take 1-3 tablets by mouth daily as needed. Takes 2-3 days out of the week [provider] Taking Active   lansoprazole (PREVACID) 30 MG capsule [id number]  Take 30 mg by mouth every morning. Lisabeth Pick, MD  Expired 08/12/20 2359 Self  lisinopril-hydrochlorothiazide (ZESTORETIC) 20-12.5 MG tablet  [id number] Yes Take 1 tablet by mouth daily. Hoyt Koch, MD Taking Active   Loperamide HCl (IMODIUM PO) [id number] Yes Take 1 tablet by mouth daily as needed (diarrhea).  [provider] Taking Active   MAGNESIUM PO [id number] Yes Take 500 mg by mouth daily. [provider] Taking Active Self  metFORMIN (GLUCOPHAGE) 1000 MG tablet [id number] Yes TAKE 1 TABLET BY MOUTH TWICE DAILY WITH A MEAL  Patient taking differently: Take 1,000 mg by mouth daily.   Hoyt Koch, MD Taking Active   mirtazapine (REMERON) 15 MG tablet [id number] No Take 1 tablet (15 mg total) by mouth at bedtime. Hoyt Koch, MD Unknown Active Self  Multiple Vitamin (MULTIVITAMIN WITH MINERALS) TABS [id number] Yes Take 1 tablet by mouth every morning. [provider] Taking Active Self  nystatin ointment (MYCOSTATIN) [id number] No Apply topically 2 (two) times daily.  Patient not taking: Reported on 01/21/2021   Hoyt Koch, MD Not Taking Consider Medication Status and Discontinue Self  potassium chloride SA (KLOR-CON) 20 MEQ tablet [id number] Yes Take 1 tablet (20 mEq total) by mouth daily. Hoyt Koch, MD Taking Active   rOPINIRole (REQUIP) 2 MG tablet [id number] No Take 1 tablet (2 mg total) by mouth daily.  Patient not taking: Reported on 01/21/2021   Hoyt Koch, MD Not Taking Consider Medication Status and Discontinue (Patient Preference) Self  tiZANidine (ZANAFLEX) 2 MG tablet [id number] No TAKE 1 TABLET BY MOUTH ONCE DAILY AT BEDTIME  Patient not taking: Reported on 01/21/2021   Hoyt Koch, MD Not Taking Consider Medication Status and Discontinue (Completed Course)   traZODone (DESYREL) 50 MG tablet [id number] Yes Take 1-2 tablets (50-100 mg total) by mouth at bedtime as needed for sleep. Hoyt Koch, MD Taking Active           Patient Active Problem List   Diagnosis Date Noted  . Adjustment disorder 12/20/2020  . Scalp  lesion 12/20/2020  . Spinal stenosis of lumbar region 03/19/2020  . Venous insufficiency 03/30/2018  . Unilateral primary osteoarthritis, right hip 12/29/2017  . Smokers' cough ([place]) 11/13/2016  . Routine general medical examination at a health care facility 09/05/2015  . Diarrhea 03/15/2015  . Restless leg syndrome 08/28/2014  . VARICOSE VEINS, LOWER EXTREMITIES 05/14/2008  . Hyperlipidemia associated with type 2 diabetes mellitus ([place]) 04/14/2007  . Diabetes mellitus type 2 with complications ([place]) 07/86/7544  . Essential hypertension 03/29/2007    Immunization History  Administered Date(s) Administered  . Fluad Quad(high Dose 65+) 06/10/2019, 05/28/2020  . Influenza Split 06/19/2011, 07/15/2012  . Influenza Whole 06/01/2008, 06/05/2009, 06/06/2010  . Influenza, High Dose Seasonal PF 06/28/2015, 06/03/2018  . Influenza,inj,Quad PF,6+ Mos 07/26/2013, 07/31/2014, 05/05/2016  . Influenza-Unspecified 05/31/2017  . PFIZER(Purple Top)SARS-COV-2 Vaccination 10/30/2019, 11/28/2019, 06/07/2020  . Pneumococcal Conjugate-13 09/05/2015  . Pneumococcal Polysaccharide-23 05/14/2008  . Td 10/29/1996, 05/14/2008  . Zoster 06/06/2010    Conditions to be addressed/monitored:  Hypertension, Diabetes, GERD, Depression, Anxiety, Osteoarthritis and Tobacco use  Care Plan : CCM Care Plan  Updates made by Tomasa Blase, RPH since 01/21/2021 12:00 AM    Problem: DM2, HTN, Depression/ Anxiety, OA, GERD, Smoking Cessation   Priority: High  Onset Date: 01/21/2021    Goal: Disease Management   Start Date: 01/21/2021  Expected End Date: 07/24/2021  This Visit's Progress: On track  Priority: High  Note:   Current Barriers:  . Unable to independently monitor therapeutic efficacy  Pharmacist Clinical Goal(s):  Marland Kitchen Patient will achieve adherence to monitoring guidelines and medication adherence to achieve therapeutic efficacy . adhere to prescribed medication regimen as evidenced by patient reported  medication list  through collaboration with PharmD and provider.  Interventions: . 1:1 collaboration with Hoyt Koch, MD regarding development and update of comprehensive plan of care as evidenced by provider attestation and co-signature . Inter-disciplinary care team collaboration (see longitudinal plan of care) . Comprehensive medication review performed; medication list updated in electronic medical record  Hypertension (BP goal <140/90) -Controlled - last check in office was 128/70 - HR 73 -Current treatment: . Lisinopril-HCTZ 32m-12.5mg - 1 tablet daily  . Furosemide 22mdaily . Klor-Con 2087m- 1 tablet daily   -Medications previously tried: fosinopril  -Current home readings: unsure as she has not been checking at home  -Current dietary habits: reports that she tries to reduce sodium in foods that she eats -Current exercise habits: limited  -Denies hypotensive/hypertensive symptoms -Educated on BP goals and benefits of medications for prevention of heart attack, stroke and kidney damage; Daily salt intake goal < 2300 mg; Importance of home blood pressure monitoring; Symptoms of hypotension and importance of maintaining adequate hydration; -Counseled to monitor BP at home if possible, document, and provide log at future appointments -Counseled on diet and exercise extensively Recommended to continue current medication  Diabetes (A1c goal <7%) -Not ideally controlled  - Last LDL 47 mg/dL - 07/19/2020 -Current medications: . MMarland Kitchentformin 1000m46mD  - taking metformin 1000mg39mly  . Glipizide 5mg d65my before breakfast  . ASA 81mg d22m  -Medications previously tried: crestor   -Current home glucose readings . fasting glucose: unknown, patient has not been checking blood sugars as of late  -Denies hypoglycemic/hyperglycemic symptoms -Current exercise: limited for patient, reports that back pain limits ability to walk/ be active -Educated on A1c and blood sugar  goals; Complications of diabetes including kidney damage, retinal damage, and cardiovascular disease; Prevention and management of hypoglycemic episodes; Benefits of routine self-monitoring of blood sugar; -Counseled to check feet daily and get yearly eye exams -Counseled on diet and exercise extensively Recommended for patient to switch to Metformin XR formulation as she noted to diarrhea and GI upset - since reducing to 1000mg da40mof IR formulation this has helped but notes she is still having issues    Patient to confirm if she is taking glipizide, will start recording blood sugar daily   Depression/Anxiety (Goal: mood control / prevention of anxiety attack) -Not ideally controlled -Current treatment: . alprazolam 0.25mg - 17mlet BID prn - reports that she does not take very often, but still has medication  . Buspirone 5mg daily62mN  - patient unsure if she is taking  . Trazodone 50mg - 1-243mlets nightly at bedtime  -Medications previously tried/failed: n/a -PHQ9: 13 -Educated on Benefits of medication for symptom control Benefits of cognitive-behavioral therapy with or without medication -Recommended for patient to record what medications she is currently taking for depression / anxiety - once current medications are known, able to make adjustments if needed   Tobacco use (Goal tobacco cessation) -Uncontrolled -Previous quit attempts: 14 years ago  -Current treatment  . n/a -Patient smokes Within 30 minutes of waking -Patient triggers include: stress and finishing a meal -On a scale of 1-10, reports MOTIVATION to quit is 3 -On a scale of 1-10, reports CONFIDENCE in quitting is 3 -discussed with patient benefits for smoking cessation, reviewed with patient medication options for smoking cessation -Recommended for patient to determine a potential quit date for next appointment   GERD (Goal: Acid control / Flare prevention ) -Controlled -Current treatment  . Lansoprazole  30mg daily 51modium - 1 tablet daily (patient unsure of  strength) .  Pepto-bismol - 50m daily if needed  -Medications previously tried: n/a -Recommended to continue current medication Recommended switch of metformin formulations may result in patient not needing medications for diarrhea/ upset stomach    Glaucoma (Goal: Pressure control / prevention of disease progression) -Controlled -Current treatment  . Brimonidine 0.15% - 1 drop into each eye at bedtime  . Dorzolamide -Timolol 22.3-6.8 mg/mL - 1 drop into both eyes twice daily  -Medications previously tried: n/a  -Recommended to continue current medication   Osteoarthritis / Spinal Stenosis (Goal: Pain control) -Not ideally controlled -Current treatment  . IBU 4071m- 1 tablet daily prn  -Medications previously tried: Arthritis Hot pain cream - reports regular use was able to control pain - unsure as to why she stopped using  -Recommended for patient to restart use of arthritis hot pain cream Educated on avoiding frequent use of NSAIDS in setting of reduced kidney function   Health Maintenance -Current therapy:  . Multivitamin once daily  . Calcium Carbonate - Vit D - 1200-100047m 1 tablet daily  . Magnesium 500m37mily  -Educated on Cost vs benefit of each product must be carefully weighed by individual consumer -Patient is satisfied with current therapy and denies issues -Recommended to continue current medication   Patient Goals/Self-Care Activities . Patient will:  - take medications as prescribed focus on medication adherence by recording list of medications that she takes - continuing use of pill box and missing no days  check glucose daily , document, and provide at future appointments engage in dietary modifications by reducing sodium intake, moderation of carb intake   Follow Up Plan: Face to Face appointment with care management team member scheduled for:  The patient has been provided with contact  information for the care management team and has been advised to call with any health related questions or concerns.        Medication Assistance: None required.  Patient affirms current coverage meets needs.  Patient's preferred pharmacy is:  [id number]: [phone]: [phone] pill box? Yes Pt endorses 100% compliance - of prescribed medications   Care Plan and Follow Up Patient Decision:  Patient agrees to Care Plan and Follow-up.  Plan: Face to Face appointment with care management team member scheduled for: 1 month and The patient has been provided with contact information for the care management team and has been advised to call with any health related questions or concerns.   Will plan for patient to stop metformin IR, will replace with Metformin XR 500mg30m tablets daily

## 2021-01-21 ENCOUNTER — Other Ambulatory Visit: Payer: Self-pay

## 2021-01-21 ENCOUNTER — Ambulatory Visit (INDEPENDENT_AMBULATORY_CARE_PROVIDER_SITE_OTHER): Payer: Medicare Other

## 2021-01-21 DIAGNOSIS — M1611 Unilateral primary osteoarthritis, right hip: Secondary | ICD-10-CM | POA: Diagnosis not present

## 2021-01-21 DIAGNOSIS — M48062 Spinal stenosis, lumbar region with neurogenic claudication: Secondary | ICD-10-CM

## 2021-01-21 DIAGNOSIS — E118 Type 2 diabetes mellitus with unspecified complications: Secondary | ICD-10-CM

## 2021-01-21 DIAGNOSIS — I10 Essential (primary) hypertension: Secondary | ICD-10-CM

## 2021-01-21 NOTE — Patient Instructions (Addendum)
Visit Information   PATIENT GOALS:  Goals Addressed            This Visit's Progress   . Monitor and Manage My Blood Sugar-Diabetes Type 2       Timeframe:  Long-Range Goal Priority:  High Start Date:  01/21/2021                           Expected End Date:  07/24/2021                   Follow Up Date 02/25/2021    - check blood sugar at prescribed times - check blood sugar if I feel it is too high or too low - take the blood sugar log to all doctor visits - take the blood sugar meter to all doctor visits    Why is this important?    Checking your blood sugar at home helps to keep it from getting very high or very low.   Writing the results in a diary or log helps the doctor know how to care for you.   Your blood sugar log should have the time, date and the results.   Also, write down the amount of insulin or other medicine that you take.   Other information, like what you ate, exercise done and how you were feeling, will also be helpful.     Notes:        Consent to CCM Services: Tracy Hammond was given information about Chronic Care Management services today including:  1. CCM service includes personalized support from designated clinical staff supervised by her physician, including individualized plan of care and coordination with other care providers 2. 24/7 contact phone numbers for assistance for urgent and routine care needs. 3. Service will only be billed when office clinical staff spend 20 minutes or more in a month to coordinate care. 4. Only one practitioner may furnish and bill the service in a calendar month. 5. The patient may stop CCM services at any time (effective at the end of the month) by phone call to the office staff. 6. The patient will be responsible for cost sharing (co-pay) of up to 20% of the service fee (after annual deductible is met).  Patient agreed to services and verbal consent obtained.   The patient verbalized understanding of  instructions, educational materials, and care plan provided today and declined offer to receive copy of patient instructions, educational materials, and care plan.   Face to Face appointment with care management team member scheduled for:  The patient has been provided with contact information for the care management team and has been advised to call with any health related questions or concerns.   Tomasa Blase, PharmD Clinical Pharmacist, [place]     CLINICAL CARE PLAN: Patient Care Plan: CCM Care Plan    Problem Identified: DM2, HTN, Depression/ Anxiety, OA, GERD, Smoking Cessation   Priority: High  Onset Date: 01/21/2021    Goal: Disease Management   Start Date: 01/21/2021  Expected End Date: 07/24/2021  This Visit's Progress: On track  Priority: High  Note:   Current Barriers:  . Unable to independently monitor therapeutic efficacy  Pharmacist Clinical Goal(s):  Marland Kitchen Patient will achieve adherence to monitoring guidelines and medication adherence to achieve therapeutic efficacy . adhere to prescribed medication regimen as evidenced by patient reported medication list  through collaboration with PharmD and provider.   Interventions: . 1:1 collaboration  with Hoyt Koch, MD regarding development and update of comprehensive plan of care as evidenced by provider attestation and co-signature . Inter-disciplinary care team collaboration (see longitudinal plan of care) . Comprehensive medication review performed; medication list updated in electronic medical record  Hypertension (BP goal <140/90) -Controlled - last check in office was 128/70 - HR 73 -Current treatment: . Lisinopril-HCTZ 39m-12.5mg - 1 tablet daily  . Furosemide 254mdaily . Klor-Con 2053m- 1 tablet daily   -Medications previously tried: fosinopril  -Current home readings: unsure as she has not been checking at home  -Current dietary habits: reports that she tries to reduce sodium in  foods that she eats -Current exercise habits: limited  -Denies hypotensive/hypertensive symptoms -Educated on BP goals and benefits of medications for prevention of heart attack, stroke and kidney damage; Daily salt intake goal < 2300 mg; Importance of home blood pressure monitoring; Symptoms of hypotension and importance of maintaining adequate hydration; -Counseled to monitor BP at home if possible, document, and provide log at future appointments -Counseled on diet and exercise extensively Recommended to continue current medication  Diabetes (A1c goal <7%) -Not ideally controlled  - Last LDL 47 mg/dL - 07/19/2020 -Current medications: . MMarland Kitchentformin 1000m48mD  - taking metformin 1000mg75mly  . Glipizide 5mg d11my before breakfast  . ASA 81mg d56m  -Medications previously tried: crestor   -Current home glucose readings . fasting glucose: unknown, patient has not been checking blood sugars as of late  -Denies hypoglycemic/hyperglycemic symptoms -Current exercise: limited for patient, reports that back pain limits ability to walk/ be active -Educated on A1c and blood sugar goals; Complications of diabetes including kidney damage, retinal damage, and cardiovascular disease; Prevention and management of hypoglycemic episodes; Benefits of routine self-monitoring of blood sugar; -Counseled to check feet daily and get yearly eye exams -Counseled on diet and exercise extensively Recommended for patient to switch to Metformin XR formulation as she noted to diarrhea and GI upset - since reducing to 1000mg da1mof IR formulation this has helped but notes she is still having issues    Patient to confirm if she is taking glipizide, will start recording blood sugar daily   Depression/Anxiety (Goal: mood control / prevention of anxiety attack) -Not ideally controlled -Current treatment: . alprazolam 0.25mg - 127mlet BID prn - reports that she does not take very often, but still has  medication  . Buspirone 5mg daily68mN  - patient unsure if she is taking  . Trazodone 50mg - 1-234mlets nightly at bedtime  -Medications previously tried/failed: n/a -PHQ9: 13 -Educated on Benefits of medication for symptom control Benefits of cognitive-behavioral therapy with or without medication -Recommended for patient to record what medications she is currently taking for depression / anxiety - once current medications are known, able to make adjustments if needed   Tobacco use (Goal tobacco cessation) -Uncontrolled -Previous quit attempts: 14 years ago  -Current treatment  . n/a -Patient smokes Within 30 minutes of waking -Patient triggers include: stress and finishing a meal -On a scale of 1-10, reports MOTIVATION to quit is 3 -On a scale of 1-10, reports CONFIDENCE in quitting is 3 -discussed with patient benefits for smoking cessation, reviewed with patient medication options for smoking cessation -Recommended for patient to determine a potential quit date for next appointment   GERD (Goal: Acid control / Flare prevention ) -Controlled -Current treatment  . Lansoprazole 30mg daily 53modium - 1 tablet daily (patient unsure of strength) .  Pepto-bismol -  41m daily if needed  -Medications previously tried: n/a -Recommended to continue current medication Recommended switch of metformin formulations may result in patient not needing medications for diarrhea/ upset stomach    Glaucoma (Goal: Pressure control / prevention of disease progression) -Controlled -Current treatment  . Brimonidine 0.15% - 1 drop into each eye at bedtime  . Dorzolamide -Timolol 22.3-6.8 mg/mL - 1 drop into both eyes twice daily  -Medications previously tried: n/a  -Recommended to continue current medication   Osteoarthritis / Spinal Stenosis (Goal: Pain control) -Not ideally controlled -Current treatment  . IBU 4070m- 1 tablet daily prn  -Medications previously tried: Arthritis Hot pain cream  - reports regular use was able to control pain - unsure as to why she stopped using  -Recommended for patient to restart use of arthritis hot pain cream Educated on avoiding frequent use of NSAIDS in setting of reduced kidney function   Health Maintenance -Current therapy:  . Multivitamin once daily  . Calcium Carbonate - Vit D - 1200-100076m 1 tablet daily  . Magnesium 500m88mily  -Educated on Cost vs benefit of each product must be carefully weighed by individual consumer -Patient is satisfied with current therapy and denies issues -Recommended to continue current medication   Patient Goals/Self-Care Activities . Patient will:  - take medications as prescribed focus on medication adherence by recording list of medications that she takes - continuing use of pill box and missing no days  check glucose daily , document, and provide at future appointments engage in dietary modifications by reducing sodium intake, moderation of carb intake   Follow Up Plan: Face to Face appointment with care management team member scheduled for:  The patient has been provided with contact information for the care management team and has been advised to call with any health related questions or concerns.      Blood Glucose Monitoring, Adult Monitoring your blood sugar (glucose) is an important part of managing your diabetes. Blood glucose monitoring involves checking your blood glucose as often as directed and keeping a log or record of your results over time. Checking your blood glucose regularly and keeping a blood glucose log can:  Help you and your health care provider adjust your diabetes management plan as needed, including your medicines or insulin.  Help you understand how food, exercise, illnesses, and medicines affect your blood glucose.  Let you know what your blood glucose is at any time. You can quickly find out if you have low blood glucose (hypoglycemia) or high blood glucose  (hyperglycemia). Your health care provider will set individualized treatment goals for you. Your goals will be based on your age, other medical conditions you have, and how you respond to diabetes treatment. Generally, the goal of treatment is to maintain the following blood glucose levels:  Before meals (preprandial): 80-130 mg/dL (4.4-7.2 mmol/L).  After meals (postprandial): below 180 mg/dL (10 mmol/L).  A1C level: less than 7%. Supplies needed:  Blood glucose meter.  Test strips for your meter. Each meter has its own strips. You must use the strips that came with your meter.  A needle to prick your finger (lancet). Do not use a lancet more than one time.  A device that holds the lancet (lancing device).  A journal or log book to write down your results. How to check your blood glucose Checking your blood glucose 1. Wash your hands for at least 20 seconds with soap and water. 2. Prick the side of your finger (  not the tip) with the lancet. Do not use the same finger consecutively. 3. Gently rub the finger until a small drop of blood appears. 4. Follow instructions that come with your meter for inserting the test strip, applying blood to the strip, and using your blood glucose meter. 5. Write down your result and any notes in your log.   Using alternative sites Some meters allow you to use areas of your body other than your finger (alternative sites) to test your blood. The most common alternative sites are the forearm, the thigh, and the palm of your hand. Alternative sites may not be as accurate as the fingers because blood flow is slower in those areas. This means that the result you get may be delayed, and it may be different from the result that you would get from your finger. Use the finger only, and do not use alternative sites, if:  You think you have hypoglycemia.  You sometimes do not know that your blood glucose is getting low (hypoglycemia unawareness). General tips and  recommendations Blood glucose log  Every time you check your blood glucose, write down your result. Also write down any notes about things that may be affecting your blood glucose, such as your diet and exercise for the day. This information can help you and your health care provider: ? Look for patterns in your blood glucose over time. ? Adjust your diabetes management plan as needed.  Check if your meter allows you to download your records to a computer or if there is an app for the meter. Most glucose meters store a record of glucose readings in the meter.   If you have type 1 diabetes:  Check your blood glucose 4 or more times a day if you are on intensive insulin therapy with multiple daily injections (MDI) or if you are using an insulin pump. Check your blood glucose: ? Before every meal and snack. ? Before bedtime.  Also check your blood glucose: ? If you have symptoms of hypoglycemia. ? After treating low blood glucose. ? Before doing activities that create a risk for injury, like driving or using machinery. ? Before and after exercise. ? Two hours after a meal. ? Occasionally between 2:00 a.m. and 3:00 a.m., as directed.  You may need to check your blood glucose more often, 6-10 times per day, if: ? You have diabetes that is not well controlled. ? You are ill. ? You have a history of severe hypoglycemia. ? You have hypoglycemia unawareness. If you have type 2 diabetes:  Check your blood glucose 2 or more times a day if you take insulin or other diabetes medicines.  Check your blood glucose 4 or more times a day if you are on intensive insulin therapy. Occasionally, you may also need to check your glucose between 2:00 a.m. and 3:00 a.m., as directed.  Also check your blood glucose: ? Before and after exercise. ? Before doing activities that create a risk for injury, like driving or using machinery.  You may need to check your blood glucose more often if: ? Your medicine  is being adjusted. ? Your diabetes is not well controlled. ? You are ill. General tips  Make sure you always have your supplies with you.  After you use a few boxes of test strips, adjust (calibrate) your blood glucose meter by following instructions that came with your meter.  If you have questions or need help, all blood glucose meters have a 24-hour hotline phone  number available that you can call. Also contact your health care provider with questions or concerns you may have. Where to find more information  The American Diabetes Association: www.diabetes.org  The Association of Diabetes Care & Education Specialists: www.diabeteseducator.org Contact a health care provider if:  Your blood glucose is at or above 240 mg/dL (13.3 mmol/L) for 2 days in a row.  You have been sick or have had a fever for 2 days or longer, and you are not getting better.  You have any of the following problems for more than 6 hours: ? You cannot eat or drink. ? You have nausea or vomiting. ? You have diarrhea. Get help right away if:  Your blood glucose is lower than 54 mg/dL (3 mmol/L).  You become confused, or you have trouble thinking clearly.  You have difficulty breathing.  You have moderate or large ketone levels in your urine. These symptoms may represent a serious problem that is an emergency. Do not wait to see if the symptoms will go away. Get medical help right away. Call your local emergency services (911 in the [place] Do not drive yourself to the hospital. Summary  Monitoring your blood glucose is an important part of managing your diabetes.  Blood glucose monitoring involves checking your blood glucose as often as directed and keeping a log or record of your results over time.  Your health care provider will set individualized treatment goals for you. Your goals will be based on your age, other medical conditions you have, and how you respond to diabetes treatment.  Every time you  check your blood glucose, write down your result. Also, write down any notes about things that may be affecting your blood glucose, such as your diet and exercise for the day. This information is not intended to replace advice given to you by your health care provider. Make sure you discuss any questions you have with your health care provider. Document Revised: 05/15/2020 Document Reviewed: 05/15/2020 Elsevier Patient Education  2021 [employer].

## 2021-01-24 ENCOUNTER — Other Ambulatory Visit: Payer: Self-pay | Admitting: Internal Medicine

## 2021-01-24 MED ORDER — METFORMIN HCL ER 500 MG PO TB24: 1000.0000 mg | ORAL_TABLET | Freq: Every day | ORAL | 2 refills | Status: DC

## 2021-01-24 MED ORDER — BLOOD GLUCOSE MONITOR KIT: PACK | 0 refills | Status: AC

## 2021-01-30 ENCOUNTER — Other Ambulatory Visit: Payer: Self-pay | Admitting: Internal Medicine

## 2021-02-02 ENCOUNTER — Encounter: Payer: Self-pay | Admitting: Emergency Medicine

## 2021-02-02 ENCOUNTER — Ambulatory Visit
Admission: EM | Admit: 2021-02-02 | Discharge: 2021-02-02 | Disposition: A | Discharge status: Home / Self Care | Payer: Medicare Other | Attending: Nurse Practitioner | Admitting: Nurse Practitioner

## 2021-02-02 ENCOUNTER — Other Ambulatory Visit: Payer: Self-pay

## 2021-02-02 DIAGNOSIS — R2243 Localized swelling, mass and lump, lower limb, bilateral: Secondary | ICD-10-CM | POA: Insufficient documentation

## 2021-02-02 DIAGNOSIS — L03115 Cellulitis of right lower limb: Secondary | ICD-10-CM | POA: Diagnosis not present

## 2021-02-02 MED ORDER — SULFAMETHOXAZOLE-TRIMETHOPRIM 800-160 MG PO TABS: 1.0000 | ORAL_TABLET | Freq: Two times a day (BID) | ORAL | 0 refills | Status: AC

## 2021-02-02 MED ORDER — CEFTRIAXONE SODIUM 1 G IJ SOLR
1.0000 g | Freq: Once | INTRAMUSCULAR | Status: AC
  Administered 2021-02-02: 1 g via INTRAMUSCULAR

## 2021-02-02 MED ORDER — AMOXICILLIN-POT CLAVULANATE 875-125 MG PO TABS: 1.0000 | ORAL_TABLET | Freq: Two times a day (BID) | ORAL | 0 refills | Status: DC

## 2021-02-02 NOTE — ED Provider Notes (Signed)
[name]    CSN: [id number] Arrival date & time: 02/02/21  1030      History   Chief Complaint Chief Complaint  Patient presents with  . Foot Pain    HPI Tracy Hammond is a 80 y.o. female.   Subjective:   Tracy Hammond is a 80 y.o. female who presents for evaluation of a possible skin infection located on the dorsal aspect of the right foot.  Patient reports that she accidentally bumped the top of her foot on her trunk underneath her bed about 6 weeks ago.  She had a very mild abrasion that was not bleeding or draining.  She treated it with some antibiotic ointment.  For the past couple of weeks, she has noticed increased redness and swelling to that foot.  She has had swelling in both of her lower extremities which is chronic and has worsened since February of this year after her husband passed away.  She is currently being treated/evaluated for this by her PCP.  The swelling in her foot is causing some drainage as well.  She denies any fevers, chills or body aches. She is diabetic. No prior evaluation before today.   The following portions of the patient's history were reviewed and updated as appropriate: allergies, current medications, past family history, past medical history, past social history, past surgical history and problem list.       Past Medical History:  Diagnosis Date  . Allergy    seasonal  . Arthritis    fingers, back  . Bursitis   . Cancer ([place])    skin cancer arm and face  . Cataract    surgery  . Cough    smokers  . Diabetes mellitus   . GERD (gastroesophageal reflux disease)   . Glaucoma   . Hyperlipidemia    no meds currently 01/22/20  . Hypertension   . Personal history of colonic adenoma 06/12/2008  . Restless leg   . Thyroid disease    young adult-no meds now    Patient Active Problem List   Diagnosis Date Noted  . Adjustment disorder 12/20/2020  . Scalp lesion 12/20/2020  . Spinal stenosis of lumbar region  03/19/2020  . Venous insufficiency 03/30/2018  . Unilateral primary osteoarthritis, right hip 12/29/2017  . Smokers' cough ([place]) 11/13/2016  . Routine general medical examination at a health care facility 09/05/2015  . Diarrhea 03/15/2015  . Restless leg syndrome 08/28/2014  . VARICOSE VEINS, LOWER EXTREMITIES 05/14/2008  . Hyperlipidemia associated with type 2 diabetes mellitus ([place]) 04/14/2007  . Diabetes mellitus type 2 with complications ([place]) 81/85/6314  . Essential hypertension 03/29/2007    Past Surgical History:  Procedure Laterality Date  . Bladder Tact    . CATARACT EXTRACTION  09-07-2012   rt eye  . CATARACT EXTRACTION Right 08/2012  . CHOLECYSTECTOMY    . COLONOSCOPY    . EYE SURGERY    . LUMBAR LAMINECTOMY/DECOMPRESSION MICRODISCECTOMY N/A 08/12/2020   Procedure: LUMBAR FOUR-FIVE DECOMPRESSION, LEFT LUMBAR FIVE-SACRAL ONE MICRODISCECTOMY;  Surgeon: Marybelle Killings, MD;  Location: [place];  Service: Orthopedics;  Laterality: N/A;  . SKIN CANCER EXCISION    . TONSILLECTOMY AND ADENOIDECTOMY    . TUBAL LIGATION      OB History   No obstetric history on file.      Home Medications    Prior to Admission medications   Medication Sig Start Date End Date Taking? Authorizing Provider  amoxicillin-clavulanate (AUGMENTIN) 875-125 MG tablet Take  1 tablet by mouth every 12 (twelve) hours. 02/02/21  Yes Enrique Sack, FNP  bismuth subsalicylate (PEPTO BISMOL) 262 MG/15ML suspension Take 30 mLs by mouth every 6 (six) hours as needed.   Yes [provider]  blood glucose meter kit and supplies KIT Dispense based on patient and insurance preference. Use daily as directed. 01/24/21  Yes Hoyt Koch, MD  brimonidine (ALPHAGAN) 0.15 % ophthalmic solution Place 1 drop into both eyes in the morning and at bedtime. 01/15/15  Yes [provider]  busPIRone (BUSPAR) 5 MG tablet TAKE 1 TABLET BY MOUTH ONCE DAILY AS NEEDED FOR ANXIETY 01/30/21  Yes Hoyt Koch, MD  Calcium Carbonate-Vit D-Min (CALCIUM 1200) 1200-1000 MG-UNIT CHEW Chew 1 tablet by mouth daily.   Yes [provider]  dorzolamide-timolol (COSOPT) 22.3-6.8 MG/ML ophthalmic solution Place 1 drop into both eyes 2 (two) times daily. 01/08/20  Yes [provider]  furosemide (LASIX) 20 MG tablet Take 1 tablet (20 mg total) by mouth daily. 12/19/20  Yes Hoyt Koch, MD  IBUPROFEN PO Take 1-3 tablets by mouth daily as needed. Takes 2-3 days out of the week   Yes [provider]  lisinopril-hydrochlorothiazide (ZESTORETIC) 20-12.5 MG tablet Take 1 tablet by mouth daily. 01/13/21  Yes Hoyt Koch, MD  Loperamide HCl (IMODIUM PO) Take 1 tablet by mouth daily as needed (diarrhea).   Yes [provider]  MAGNESIUM PO Take 500 mg by mouth daily.   Yes [provider]  metFORMIN (GLUCOPHAGE XR) 500 MG 24 hr tablet Take 2 tablets (1,000 mg total) by mouth daily with breakfast. 01/24/21  Yes Hoyt Koch, MD  mirtazapine (REMERON) 15 MG tablet Take 1 tablet (15 mg total) by mouth at bedtime. 12/20/18  Yes Hoyt Koch, MD  Multiple Vitamin (MULTIVITAMIN WITH MINERALS) TABS Take 1 tablet by mouth every morning.   Yes [provider]  potassium chloride SA (KLOR-CON) 20 MEQ tablet Take 1 tablet (20 mEq total) by mouth daily. 12/19/20  Yes Hoyt Koch, MD  sulfamethoxazole-trimethoprim (BACTRIM DS) 800-160 MG tablet Take 1 tablet by mouth 2 (two) times daily for 7 days. 02/02/21 02/09/21 Yes Enrique Sack, FNP  tiZANidine (ZANAFLEX) 2 MG tablet TAKE 1 TABLET BY MOUTH ONCE DAILY AT BEDTIME 11/26/20  Yes Hoyt Koch, MD  traZODone (DESYREL) 50 MG tablet Take 1-2 tablets (50-100 mg total) by mouth at bedtime as needed for sleep. 12/19/20  Yes Hoyt Koch, MD  ALPRAZolam Duanne Moron) 0.25 MG tablet Take 1 tablet (0.25 mg total) by mouth 2 (two) times daily as needed for anxiety. Patient not taking:  Reported on 01/21/2021 12/23/20   Hoyt Koch, MD  aspirin 81 MG tablet Take 1 tablet (81 mg total) by mouth every morning. STOP TODAY 11/25, RESTART 08/08/2013 Patient taking differently: Take 81 mg by mouth every morning. 07/25/13   Gatha Mayer, MD  Cyanocobalamin 1500 MCG TBDP Take 1,500 mcg by mouth daily. Patient not taking: Reported on 01/21/2021    [provider]  diclofenac sodium (VOLTAREN) 1 % GEL APPLY A THIN LAYER TO AFFECTED AREA UP TO 4 TIMES A DAY AS NEEDED Patient not taking: Reported on 01/21/2021 03/25/18   Mcarthur Rossetti, MD  diphenoxylate-atropine (LOMOTIL) 2.5-0.025 MG tablet Take 1 tablet by mouth 4 (four) times daily as needed for diarrhea or loose stools. Patient not taking: Reported on 01/21/2021 09/20/19   Hoyt Koch, MD  gabapentin (NEURONTIN) 100 MG capsule Take  100 mg by mouth at bedtime. Patient not taking: Reported on 01/21/2021    [provider]  glipiZIDE (GLUCOTROL) 5 MG tablet TAKE 1 TABLET BY MOUTH BEFORE BREAKFAST Patient taking differently: Patient could not recall 11/26/20   Hoyt Koch, MD  lansoprazole (PREVACID) 30 MG capsule Take 30 mg by mouth every morning. 07/15/12 08/12/20  Swords, Darrick Penna, MD  nystatin ointment (MYCOSTATIN) Apply topically 2 (two) times daily. Patient not taking: Reported on 01/21/2021 07/19/20   Hoyt Koch, MD  rOPINIRole (REQUIP) 2 MG tablet Take 1 tablet (2 mg total) by mouth daily. Patient not taking: Reported on 01/21/2021 09/25/19   Hoyt Koch, MD    Family History Family History  Problem Relation Age of Onset  . Hypertension Father        family hx  . Diabetes Mother   . Kidney failure Mother   . Heart disease Mother   . Stroke Sister   . Other Sister        Leg Disease  . Heart disease Son   . Colon cancer Neg Hx   . Esophageal cancer Neg Hx   . Rectal cancer Neg Hx   . Stomach cancer Neg Hx     Social History Social History    Tobacco Use  . Smoking status: Current Every Day Smoker    Packs/day: 0.50    Years: 60.00    Pack years: 30.00    Types: Cigarettes  . Smokeless tobacco: Never Used  Vaping Use  . Vaping Use: Never used  Substance Use Topics  . Alcohol use: No  . Drug use: No     Allergies   Patient has no known allergies.   Review of Systems Review of Systems  Constitutional: Negative for fever.  Respiratory: Negative for shortness of breath.   Cardiovascular: Positive for leg swelling. Negative for chest pain and palpitations.  Skin: Positive for wound.  All other systems reviewed and are negative.    Physical Exam Triage Vital Signs ED Triage Vitals  Enc Vitals Group     BP 02/02/21 1213 (!) 168/84     Pulse Rate 02/02/21 1213 82     Resp --      Temp 02/02/21 1213 97.8 F (36.6 C)     Temp Source 02/02/21 1213 Oral     SpO2 02/02/21 1213 95 %     Weight --      Height --      Head Circumference --      Peak Flow --      Pain Score 02/02/21 1214 0     Pain Loc --      Pain Edu? --      Excl. in [place]? --    No data found.  Updated Vital Signs BP (!) 168/84 (BP Location: Right Arm)   Pulse 82   Temp 97.8 F (36.6 C) (Oral)   SpO2 95%   Visual Acuity Right Eye Distance:   Left Eye Distance:   Bilateral Distance:    Right Eye Near:   Left Eye Near:    Bilateral Near:     Physical Exam Vitals reviewed.  Constitutional:      General: She is not in acute distress.    Appearance: Normal appearance. She is not ill-appearing, toxic-appearing or diaphoretic.  HENT:     Head: Normocephalic.  Cardiovascular:     Rate and Rhythm: Normal rate.  Pulmonary:     Effort: Pulmonary effort is  normal.  Musculoskeletal:        General: Normal range of motion.     Cervical back: Normal range of motion and neck supple.  Skin:    General: Skin is warm and dry.  Neurological:     General: No focal deficit present.     Mental Status: She is alert and oriented to person,  place, and time.  Psychiatric:        Mood and Affect: Mood normal.        Behavior: Behavior normal.         UC Treatments / Results  Labs (all labs ordered are listed, but only abnormal results are displayed) Labs Reviewed  AEROBIC CULTURE W GRAM STAIN (SUPERFICIAL SPECIMEN)    EKG   Radiology No results found.  Procedures Procedures (including critical care time)  Medications Ordered in UC Medications  cefTRIAXone (ROCEPHIN) injection 1 g (1 g Intramuscular Given 02/02/21 1321)    Initial Impression / Assessment and Plan / UC Course  I have reviewed the triage vital signs and the nursing notes.  Pertinent labs & imaging results that were available during my care of the patient were reviewed by me and considered in my medical decision making (see chart for details).    80 year old female presenting with worsening swelling of the bilateral lower extremities as well as an obvious cellulitis to the dorsal aspect of the right foot likely due to trauma 6 weeks ago.  There is no obvious abscess but there is some seepage of fluids noted.  Very scant amount which has been sent for cultures.  Patient was given a gram of Rocephin IM in the clinic and prescribed both Bactrim and Augmentin for her infection.  Patient's pharmacy will not be open until tomorrow.  She is instructed to start the medications as soon as she is able to get it.  She is also advised to start a daily probiotic for gut health while she is on these antibiotics.  Discussed extensively with the patient and daughter monitoring parameters that would indicate worsening of her symptoms.  Patient advised to follow-up with PCP regarding swelling of her feet and to return to the clinic in 2 days for evaluation of her foot.   Today's evaluation has revealed no signs of a dangerous process. Discussed diagnosis with patient and/or guardian. Patient and/or guardian aware of their diagnosis, possible red flag symptoms to watch out  for and need for close follow up. Patient and/or guardian understands verbal and written discharge instructions. Patient and/or guardian comfortable with plan and disposition.  Patient and/or guardian has a clear mental status at this time, good insight into illness (after discussion and teaching) and has clear judgment to make decisions regarding their care  This care was provided during an unprecedented National Emergency due to the Novel Coronavirus (COVID-19) pandemic. COVID-19 infections and transmission risks place heavy strains on healthcare resources.  As this pandemic evolves, our facility, providers, and staff strive to respond fluidly, to remain operational, and to provide care relative to available resources and information. Outcomes are unpredictable and treatments are without well-defined guidelines. Further, the impact of COVID-19 on all aspects of urgent care, including the impact to patients seeking care for reasons other than COVID-19, is unavoidable during this national emergency. At this time of the global pandemic, management of patients has significantly changed, even for non-COVID positive patients given high local and regional COVID volumes at this time requiring high healthcare system and resource utilization. The standard  of care for management of both COVID suspected and non-COVID suspected patients continues to change rapidly at the local, regional, national, and global levels. This patient was worked up and treated to the best available but ever changing evidence and resources available at this current time.   Documentation was completed with the aid of voice recognition software. Transcription may contain typographical errors.  Final Clinical Impressions(s) / UC Diagnoses   Final diagnoses:  Cellulitis of foot, right  Localized swelling, mass, or lump of lower extremity, bilateral     Discharge Instructions     . Take both antibiotics as prescribed. Do not stop taking it  even if you start to get better.  Take a probiotic everyday while on the antibiotics . Monitor for worsening of symptoms (see attached information)  . Keep extremity elevated as much as possible to help reduce swelling  . Return in 2 days for a re-check  . Follow-up with your primary care provider regarding the swelling in both your legs     ED Prescriptions    Medication Sig Dispense Auth. Provider   sulfamethoxazole-trimethoprim (BACTRIM DS) 800-160 MG tablet Take 1 tablet by mouth 2 (two) times daily for 7 days. 14 tablet Enrique Sack, FNP   amoxicillin-clavulanate (AUGMENTIN) 875-125 MG tablet Take 1 tablet by mouth every 12 (twelve) hours. 14 tablet Enrique Sack, FNP     PDMP not reviewed this encounter.   Enrique Sack, [place] 02/02/21 1615

## 2021-02-02 NOTE — ED Triage Notes (Signed)
Patient states that she scratched the top of her right foot on a chest about 6 weeks ago.  The area was red and applied Neosporin, cleaned with alcohol, applied ice.  Patient has been taken Ibuprofen.  The area got better then comes back.

## 2021-02-02 NOTE — Discharge Instructions (Addendum)
Take both antibiotics as prescribed. Do not stop taking it even if you start to get better.  Take a probiotic everyday while on the antibiotics Monitor for worsening of symptoms (see attached information)  Keep extremity elevated as much as possible to help reduce swelling  Return in 2 days for a re-check  Follow-up with your primary care provider regarding the swelling in both your legs

## 2021-02-04 ENCOUNTER — Other Ambulatory Visit: Payer: Self-pay

## 2021-02-04 ENCOUNTER — Ambulatory Visit (INDEPENDENT_AMBULATORY_CARE_PROVIDER_SITE_OTHER): Payer: Medicare Other | Admitting: Internal Medicine

## 2021-02-04 ENCOUNTER — Encounter: Payer: Self-pay | Admitting: Internal Medicine

## 2021-02-04 DIAGNOSIS — L089 Local infection of the skin and subcutaneous tissue, unspecified: Secondary | ICD-10-CM | POA: Diagnosis not present

## 2021-02-04 DIAGNOSIS — E11628 Type 2 diabetes mellitus with other skin complications: Secondary | ICD-10-CM | POA: Diagnosis not present

## 2021-02-04 LAB — AEROBIC CULTURE W GRAM STAIN (SUPERFICIAL SPECIMEN): Culture: NO GROWTH

## 2021-02-04 MED ORDER — MIRTAZAPINE 15 MG PO TABS: 15.0000 mg | ORAL_TABLET | Freq: Every day | ORAL | 3 refills | Status: AC

## 2021-02-04 MED ORDER — DIPHENOXYLATE-ATROPINE 2.5-0.025 MG PO TABS: 1.0000 | ORAL_TABLET | Freq: Four times a day (QID) | ORAL | 0 refills | Status: DC | PRN

## 2021-02-04 NOTE — Progress Notes (Signed)
   Subjective:   Patient ID: Tracy Hammond, female    DOB: 10-17-1940, 80 y.o.   MRN: [id number]  HPI The patient is a 80 YO female coming in for concerns about urgent care follow up of cellulitis on the leg and foot. The foot got a red spot without much trama to it. Started last week. She is on 2 different antibiotics and taking those without side effects. The foot is swollen more.   Review of Systems  Constitutional: Negative.   HENT: Negative.    Eyes: Negative.   Respiratory:  Negative for cough, chest tightness and shortness of breath.   Cardiovascular:  Positive for leg swelling. Negative for chest pain and palpitations.  Gastrointestinal:  Negative for abdominal distention, abdominal pain, constipation, diarrhea, nausea and vomiting.  Musculoskeletal: Negative.   Skin:  Positive for rash.  Neurological: Negative.   Psychiatric/Behavioral: Negative.     Objective:  Physical Exam Constitutional:      Appearance: She is well-developed.  HENT:     Head: Normocephalic and atraumatic.  Cardiovascular:     Rate and Rhythm: Normal rate and regular rhythm.  Pulmonary:     Effort: Pulmonary effort is normal. No respiratory distress.     Breath sounds: Normal breath sounds. No wheezing or rales.  Abdominal:     General: Bowel sounds are normal. There is no distension.     Palpations: Abdomen is soft.     Tenderness: There is no abdominal tenderness. There is no rebound.  Musculoskeletal:     Cervical back: Normal range of motion.     Right lower leg: Edema present.     Left lower leg: Edema present.  Skin:    General: Skin is warm and dry.     Comments: Right foot top surface with redness no skin breakdown, toes are cold to touch, no palpable pulses PT or DP, left foot with toes cool but warmer than right, no palpable DP or PT.   Neurological:     Mental Status: She is alert and oriented to person, place, and time.     Coordination: Coordination normal.    Vitals:    02/04/21 1446  BP: 134/78  Pulse: 86  Resp: 18  Temp: 98.7 F (37.1 C)  TempSrc: Oral  SpO2: 94%  Weight: 128 lb 12.8 oz (58.4 kg)  Height: 5' 1.5" (1.562 m)    This visit occurred during the SARS-CoV-2 public health emergency.  Safety protocols were in place, including screening questions prior to the visit, additional usage of staff PPE, and extensive cleaning of exam room while observing appropriate contact time as indicated for disinfecting solutions.   Assessment & Plan:

## 2021-02-04 NOTE — Patient Instructions (Addendum)
I would recommend a probiotic.   We will check the blood flow to the legs.  We have sent in lomotil for the diarrhea to take daily for the diarrhea.   Keep taking the antibiotics and call us when 1-2 days left and we may have to send in more.

## 2021-02-06 ENCOUNTER — Encounter: Payer: Self-pay | Admitting: Internal Medicine

## 2021-02-06 DIAGNOSIS — E11628 Type 2 diabetes mellitus with other skin complications: Secondary | ICD-10-CM | POA: Insufficient documentation

## 2021-02-06 NOTE — Assessment & Plan Note (Signed)
She will continue taking augmentin and bactrim for 1 week. Call back toward end of course. We talked about blood flow today and I did recommend doing [place] to check blood flow. She does not wish to do that today but wait to see if this heals normally and if not healing right she will be okay to check that.

## 2021-02-20 ENCOUNTER — Other Ambulatory Visit: Payer: Self-pay | Admitting: Internal Medicine

## 2021-02-25 ENCOUNTER — Other Ambulatory Visit: Payer: Self-pay

## 2021-02-25 ENCOUNTER — Ambulatory Visit (INDEPENDENT_AMBULATORY_CARE_PROVIDER_SITE_OTHER): Payer: Medicare Other

## 2021-02-25 DIAGNOSIS — I10 Essential (primary) hypertension: Secondary | ICD-10-CM

## 2021-02-25 DIAGNOSIS — E1169 Type 2 diabetes mellitus with other specified complication: Secondary | ICD-10-CM

## 2021-02-25 DIAGNOSIS — E118 Type 2 diabetes mellitus with unspecified complications: Secondary | ICD-10-CM | POA: Diagnosis not present

## 2021-02-25 DIAGNOSIS — E785 Hyperlipidemia, unspecified: Secondary | ICD-10-CM

## 2021-02-25 NOTE — Patient Instructions (Signed)
Visit Information  PATIENT GOALS:  Goals Addressed             This Visit's Progress    Monitor and Manage My Blood Sugar-Diabetes Type 2   Not on track    Timeframe:  Long-Range Goal Priority:  High Start Date:  01/21/2021                           Expected End Date:  07/24/2021                   Follow Up Date 02/25/2021    - check blood sugar at prescribed times - check blood sugar if I feel it is too high or too low - take the blood sugar log to all doctor visits - take the blood sugar meter to all doctor visits    Why is this important?   Checking your blood sugar at home helps to keep it from getting very high or very low.  Writing the results in a diary or log helps the doctor know how to care for you.  Your blood sugar log should have the time, date and the results.  Also, write down the amount of insulin or other medicine that you take.  Other information, like what you ate, exercise done and how you were feeling, will also be helpful.     Notes:          The patient verbalized understanding of instructions, educational materials, and care plan provided today and declined offer to receive copy of patient instructions, educational materials, and care plan.   Telephone follow up appointment with care management team member scheduled for: The patient has been provided with contact information for the care management team and has been advised to call with any health related questions or concerns.  Follow up with provider re: continued swelling of feet/ ankles and mental health concerns - follow up scheduled for 02/27/21  Tomasa Blase, PharmD Clinical Pharmacist, [place]

## 2021-02-25 NOTE — Progress Notes (Signed)
Chronic Care Management Pharmacy Note  02/25/2021 Name:  Tracy Hammond MRN:  [id number] DOB:  1941-06-21   Summary: - Patient reports that since completion of antibiotic course started by ED for cellulitis 02/02/2021 - right leg/ foot is no longer hot to the touch / painful - Notes to continued diarrhea since antibiotic course (bactrim and augmentin) - has been taking lomotil and immodium along with probiotic - Reports to bilateral ankle/ foot swelling  - Reports to hallucinations over the past week - seeing/ talking to her late husband - Denies urinary symptoms/ not running a fever per patient and patient's daughter  - Denies issues since switching to metformin XR from metformin IR, unsure of recent blood sugar control  Recommendations/Changes made from today's visit: - Recommending continuation of lomotil, immodium, and probiotic, also advised for patient to ensure proper hydration with ongoing diarrhea - Patient to make urgent appointment to discuss recent hallucinations and behavioral health concerns with PCP (scheduled for 02/27/2021) - Patient to start checking blood sugars at least once daily (checked in office today and was 148)  Plan: Follow up with PCP about recent concerns in office    Subjective: Tracy Hammond is an 80 y.o. year old female who is a primary patient of Hoyt Koch, MD.  The CCM team was consulted for assistance with disease management and care coordination needs.    Engaged with patient face to face for follow up visit in response to provider referral for pharmacy case management and/or care coordination services.   Consent to Services:  The patient was given the following information about Chronic Care Management services today, agreed to services, and gave verbal consent: 1. CCM service includes personalized support from designated clinical staff supervised by the primary care provider, including individualized plan of care and coordination with  other care providers 2. 24/7 contact phone numbers for assistance for urgent and routine care needs. 3. Service will only be billed when office clinical staff spend 20 minutes or more in a month to coordinate care. 4. Only one practitioner may furnish and bill the service in a calendar month. 5.The patient may stop CCM services at any time (effective at the end of the month) by phone call to the office staff. 6. The patient will be responsible for cost sharing (co-pay) of up to 20% of the service fee (after annual deductible is met). Patient agreed to services and consent obtained.  Patient Care Team: Hoyt Koch, MD as PCP - General (Internal Medicine) Buford Dresser, MD as PCP - Cardiology (Cardiology) Delice Bison Darnelle Maffucci, [facility] as Pharmacist (Pharmacist)  Recent office visits: 02/04/2021 - PCP visit- ED follow up - cellulitis of right leg/foot - blood flow test recommended but declined - probiotic recommended during antibiotic course   Recent consult visits: No changes since last visit   Hospital visits: 02/02/2021 - ED - evaluation for cellulitis of right leg/foot - prescribed bactrim and augmentin x 7 days   Objective:  Lab Results  Component Value Date   CREATININE 0.55 01/13/2021   BUN 17 01/13/2021   GFR 88.23 01/13/2021   GFRNONAA >60 08/08/2020   GFRAA 159 05/14/2008   NA 141 01/13/2021   K 3.5 01/13/2021   CALCIUM 9.7 01/13/2021   CO2 29 01/13/2021   GLUCOSE 85 01/13/2021    Lab Results  Component Value Date/Time   HGBA1C 6.4 07/19/2020 09:05 AM   HGBA1C 7.2 (H) 01/05/2020 11:27 AM   GFR 88.23 01/13/2021 11:35 AM  GFR 85.04 07/19/2020 09:05 AM   MICROALBUR 44.2 (H) 09/20/2019 08:58 AM   MICROALBUR 1.6 09/13/2018 09:25 AM    Last diabetic Eye exam:  Lab Results  Component Value Date/Time   HMDIABEYEEXA No Retinopathy 10/18/2018 12:00 AM    Last diabetic Foot exam:  Lab Results  Component Value Date/Time   HMDIABFOOTEX done 07/26/2013 12:00 AM      Lab Results  Component Value Date   CHOL 112 07/19/2020   HDL 38.90 (L) 07/19/2020   LDLCALC 47 07/19/2020   LDLDIRECT 84.0 09/08/2017   TRIG 129.0 07/19/2020   CHOLHDL 3 07/19/2020    Hepatic Function Latest Ref Rng & Units 01/13/2021 07/19/2020 09/20/2019  Total Protein 6.0 - 8.3 g/dL 6.6 6.7 6.9  Albumin 3.5 - 5.2 g/dL 3.8 4.0 4.2  AST 0 - 37 U/L 38(H) 30 60(H)  ALT 0 - 35 U/L 25 24 49(H)  Alk Phosphatase 39 - 117 U/L 200(H) 88 102  Total Bilirubin 0.2 - 1.2 mg/dL 0.9 0.6 0.5  Bilirubin, Direct 0.0 - 0.3 mg/dL - - -    Lab Results  Component Value Date/Time   TSH 1.86 09/13/2018 09:25 AM   TSH 1.10 07/15/2012 10:21 AM    CBC Latest Ref Rng & Units 01/13/2021 08/08/2020 09/20/2019  WBC 4.0 - 10.5 K/uL 11.5(H) 8.5 9.9  Hemoglobin 12.0 - 15.0 g/dL 14.0 14.0 14.5  Hematocrit 36.0 - 46.0 % 41.9 42.4 43.6  Platelets 150.0 - 400.0 K/uL 273.0 251 231.0    Lab Results  Component Value Date/Time   VD25OH 51.09 09/13/2018 09:25 AM   VD25OH 41.30 09/05/2015 10:04 AM    Clinical ASCVD: No  The ASCVD Risk score Mikey Bussing DC Jr., et al., 2013) failed to calculate for the following reasons:   The valid total cholesterol range is 130 to 320 mg/dL    Depression screen [facility] 2/9 01/13/2021 09/26/2020 09/20/2019  Decreased Interest 0 0 0  Down, Depressed, Hopeless 3 0 0  PHQ - 2 Score 3 0 0  Altered sleeping 3 - -  Tired, decreased energy 3 - -  Change in appetite 3 - -  Feeling bad or failure about yourself  3 - -  Trouble concentrating 0 - -  Moving slowly or fidgety/restless 0 - -  PHQ-9 Score 15 - -  Difficult doing work/chores Extremely dIfficult - -      Social History   Tobacco Use  Smoking Status Every Day   Packs/day: 0.50   Years: 60.00   Pack years: 30.00   Types: Cigarettes  Smokeless Tobacco Never   BP Readings from Last 3 Encounters:  02/04/21 134/78  02/02/21 (!) 168/84  01/13/21 128/70   Pulse Readings from Last 3 Encounters:  02/04/21 86  02/02/21 82   01/13/21 73   Wt Readings from Last 3 Encounters:  02/04/21 128 lb 12.8 oz (58.4 kg)  01/13/21 128 lb 9.6 oz (58.3 kg)  12/19/20 125 lb 12.8 oz (57.1 kg)   BMI Readings from Last 3 Encounters:  02/04/21 23.94 kg/m  01/13/21 23.91 kg/m  12/19/20 23.38 kg/m    Assessment/Interventions: Review of patient past medical history, allergies, medications, health status, including review of consultants reports, laboratory and other test data, was performed as part of comprehensive evaluation and provision of chronic care management services.   SDOH:  (Social Determinants of Health) assessments and interventions performed: Yes  SDOH Screenings   Alcohol Screen: Not on file  Depression (PHQ2-9): Medium Risk   PHQ-2 Score:  42  Financial Resource Strain: Low Risk    Difficulty of Paying Living Expenses: Not hard at all  Food Insecurity: Not on file  Housing: Not on file  Physical Activity: Not on file  Social Connections: Not on file  Stress: Not on file  Tobacco Use: High Risk   Smoking Tobacco Use: Every Day   Smokeless Tobacco Use: Never  Transportation Needs: Not on file    [place]  No Known Allergies  Medications Reviewed Today     Reviewed by Hoyt Koch, MD (Physician) on 02/06/21 at 2134  Med List Status: <None>   Medication Order Taking? Sig Documenting Provider Last Dose Status Informant  ALPRAZolam (XANAX) 0.25 MG tablet [id number] Yes Take 1 tablet (0.25 mg total) by mouth 2 (two) times daily as needed for anxiety. Hoyt Koch, MD Taking Active   amoxicillin-clavulanate (AUGMENTIN) 875-125 MG tablet [id number] Yes Take 1 tablet by mouth every 12 (twelve) hours. Enrique Sack, FNP Taking Active   aspirin 81 MG tablet [id number] Yes Take 1 tablet (81 mg total) by mouth every morning. STOP TODAY 11/25, RESTART 08/08/2013  Patient taking differently: Take 81 mg by mouth every morning.   Gatha Mayer, MD Taking Active            Med Note  Riki Sheer, Fransisco Hertz   Thu Sep 26, 2020  9:30 AM)    bismuth subsalicylate (PEPTO BISMOL) 262 MG/15ML suspension [id number] Yes Take 30 mLs by mouth every 6 (six) hours as needed. [provider] Taking Active   blood glucose meter kit and supplies KIT [id number] Yes Dispense based on patient and insurance preference. Use daily as directed. Hoyt Koch, MD Taking Active   brimonidine [facility]) 0.15 % ophthalmic solution [id number] Yes Place 1 drop into both eyes in the morning and at bedtime. [provider] Taking Active Self           Med Note Jeanie Cooks Aug 07, 2020  8:17 AM)    busPIRone (BUSPAR) 5 MG tablet [id number] Yes TAKE 1 TABLET BY MOUTH ONCE DAILY AS NEEDED FOR ANXIETY Hoyt Koch, MD Taking Active   Calcium Carbonate-Vit D-Min (CALCIUM 1200) 1200-1000 MG-UNIT CHEW [id number] Yes Chew 1 tablet by mouth daily. [provider] Taking Active Self  Cyanocobalamin 1500 MCG TBDP [id number] No Take 1,500 mcg by mouth daily.  Patient not taking: No sig reported   [provider] Not Taking Active   diclofenac sodium (VOLTAREN) 1 % GEL [id number] No APPLY A THIN LAYER TO AFFECTED AREA UP TO 4 TIMES A DAY AS NEEDED  Patient not taking: No sig reported   Mcarthur Rossetti, MD Not Taking Active   diphenoxylate-atropine (LOMOTIL) 2.5-0.025 MG tablet [id number]  Take 1 tablet by mouth 4 (four) times daily as needed for diarrhea or loose stools. Hoyt Koch, MD  Active   dorzolamide-timolol (COSOPT) 22.3-6.8 MG/ML ophthalmic solution [id number] Yes Place 1 drop into both eyes 2 (two) times daily. [provider] Taking Active Self  furosemide (LASIX) 20 MG tablet [id number] Yes Take 1 tablet (20 mg total) by mouth daily. Hoyt Koch, MD Taking Active   gabapentin (NEURONTIN) 100 MG capsule [id number] No Take 100 mg by mouth at bedtime.  Patient not taking: No sig reported   [provider] Not Taking  Active   glipiZIDE (GLUCOTROL) 5 MG tablet [id number] Yes TAKE 1 TABLET BY MOUTH BEFORE BREAKFAST  Patient taking differently: Patient could  not recall   Hoyt Koch, MD Taking Active   IBUPROFEN PO [id number] Yes Take 1-3 tablets by mouth daily as needed. Takes 2-3 days out of the week [provider] Taking Active   lansoprazole (PREVACID) 30 MG capsule [id number]  Take 30 mg by mouth every morning. Lisabeth Pick, MD  Expired 08/12/20 2359 Self  lisinopril-hydrochlorothiazide (ZESTORETIC) 20-12.5 MG tablet [id number] Yes Take 1 tablet by mouth daily. Hoyt Koch, MD Taking Active   Loperamide HCl (IMODIUM PO) [id number] Yes Take 1 tablet by mouth daily as needed (diarrhea). [provider] Taking Active   MAGNESIUM PO [id number] Yes Take 500 mg by mouth daily. [provider] Taking Active Self  metFORMIN (GLUCOPHAGE XR) 500 MG 24 hr tablet [id number] Yes Take 2 tablets (1,000 mg total) by mouth daily with breakfast. Hoyt Koch, MD Taking Active   mirtazapine (REMERON) 15 MG tablet [id number]  Take 1 tablet (15 mg total) by mouth at bedtime. Hoyt Koch, MD  Active   Multiple Vitamin (MULTIVITAMIN WITH MINERALS) TABS [id number] Yes Take 1 tablet by mouth every morning. [provider] Taking Active Self  nystatin ointment (MYCOSTATIN) [id number] No Apply topically 2 (two) times daily.  Patient not taking: No sig reported   Hoyt Koch, MD Not Taking Active   potassium chloride SA (KLOR-CON) 20 MEQ tablet [id number] Yes Take 1 tablet (20 mEq total) by mouth daily. Hoyt Koch, MD Taking Active   rOPINIRole (REQUIP) 2 MG tablet [id number] No Take 1 tablet (2 mg total) by mouth daily.  Patient not taking: No sig reported   Hoyt Koch, MD Not Taking Active   sulfamethoxazole-trimethoprim (BACTRIM DS) 800-160 MG tablet [id number] Yes Take 1 tablet by mouth 2 (two) times daily for 7 days. Enrique Sack,  FNP Taking Active   tiZANidine (ZANAFLEX) 2 MG tablet [id number] Yes TAKE 1 TABLET BY MOUTH ONCE DAILY AT BEDTIME Hoyt Koch, MD Taking Active   traZODone (DESYREL) 50 MG tablet [id number] Yes Take 1-2 tablets (50-100 mg total) by mouth at bedtime as needed for sleep. Hoyt Koch, MD Taking Active             Patient Active Problem List   Diagnosis Date Noted   Diabetic foot infection ([employer]) 02/06/2021   Adjustment disorder 12/20/2020   Scalp lesion 12/20/2020   Spinal stenosis of lumbar region 03/19/2020   Venous insufficiency 03/30/2018   Unilateral primary osteoarthritis, right hip 12/29/2017   Smokers' cough ([place]) 11/13/2016   Routine general medical examination at a health care facility 09/05/2015   Diarrhea 03/15/2015   Restless leg syndrome 08/28/2014   VARICOSE VEINS, LOWER EXTREMITIES 05/14/2008   Hyperlipidemia associated with type 2 diabetes mellitus ([place]) 04/14/2007   Diabetes mellitus type 2 with complications ([place]) 38/05/1750   Essential hypertension 03/29/2007    Immunization History  Administered Date(s) Administered   Fluad Quad(high Dose 65+) 06/10/2019, 05/28/2020   Influenza Split 06/19/2011, 07/15/2012   Influenza Whole 06/01/2008, 06/05/2009, 06/06/2010   Influenza, High Dose Seasonal PF 06/28/2015, 06/03/2018   Influenza,inj,Quad PF,6+ Mos 07/26/2013, 07/31/2014, 05/05/2016   Influenza-Unspecified 05/31/2017   PFIZER(Purple Top)SARS-COV-2 Vaccination 10/30/2019, 11/28/2019, 06/07/2020   Pneumococcal Conjugate-13 09/05/2015   Pneumococcal Polysaccharide-23 05/14/2008   Td 10/29/1996, 05/14/2008   Zoster, Live 06/06/2010    Conditions to be addressed/monitored:  Hypertension, Diabetes, GERD, Depression, Anxiety, Osteoarthritis and Tobacco use  Care Plan : [place]  Updates made by Tomasa Blase, [place] since 02/25/2021 12:00  AM     Problem: DM2, HTN, Depression/ Anxiety, OA, GERD, Smoking Cessation   Priority: High  Onset  Date: 01/21/2021     Goal: Disease Management   Start Date: 01/21/2021  Expected End Date: 07/24/2021  This Visit's Progress: Not on track  Recent Progress: On track  Priority: High  Note:   Current Barriers:  Unable to independently monitor therapeutic efficacy  Pharmacist Clinical Goal(s):  Patient will achieve adherence to monitoring guidelines and medication adherence to achieve therapeutic efficacy adhere to prescribed medication regimen as evidenced by patient reported medication list  through collaboration with PharmD and provider.   Interventions: 1:1 collaboration with Hoyt Koch, MD regarding development and update of comprehensive plan of care as evidenced by provider attestation and co-signature Inter-disciplinary care team collaboration (see longitudinal plan of care) Comprehensive medication review performed; medication list updated in electronic medical record  Hypertension (BP goal <140/90) -Controlled - last check in office was 128/70 - HR 73 -Current treatment: Lisinopril-HCTZ 73m-12.5mg - 1 tablet daily  Furosemide 29mdaily Klor-Con 2076m- 1 tablet daily   -Medications previously tried: fosinopril  -Current home readings: unsure as she has not been checking at home  -Current dietary habits: reports that she tries to reduce sodium in foods that she eats -Current exercise habits: limited  -Denies hypotensive/hypertensive symptoms -Educated on BP goals and benefits of medications for prevention of heart attack, stroke and kidney damage; Daily salt intake goal < 2300 mg; Importance of home blood pressure monitoring; Symptoms of hypotension and importance of maintaining adequate hydration; -Counseled to monitor BP at home if possible, document, and provide log at future appointments -Counseled on diet and exercise extensively Recommended to continue current medication  Diabetes / CVA prevention (A1c goal <7%) -Not ideally controlled  - Last LDL 47  mg/dL - 07/19/2020 -Current medications: Metformin XR 1000m35mily Glipizide 5mg 68mly before breakfast - confirmed taking today  ASA 81mg 84my  -Medications previously tried: crestor   -Current home glucose readings - n/a -checked in office today and was 148  fasting glucose: unknown, patient has not been checking blood sugars as of late  -Denies hypoglycemic/hyperglycemic symptoms -Current exercise: limited for patient, reports that back pain limits ability to walk/ be active -Educated on A1c and blood sugar goals; Complications of diabetes including kidney damage, retinal damage, and cardiovascular disease; Prevention and management of hypoglycemic episodes; Benefits of routine self-monitoring of blood sugar; -Counseled to check feet daily and get yearly eye exams -Counseled on diet and exercise extensively -Recommended patient to continue checking blood sugar at least once daily   Depression/Anxiety (Goal: mood control / prevention of anxiety attack) -Not ideally controlled -Current treatment: alprazolam 0.25mg -23mablet BID prn - reports that she does not take very often, but still has medication  Buspirone 5mg [id number]  - patient unsure if she is taking  Trazodone 50mg - 158mablets nightly at bedtime  -Medications previously tried/failed: n/a -PHQ9: 13 - Patient reports that she has not felt mental health is under as good of control as she would like  - Notes to hallucinations over the last week - has been seeing her late husband, notes to lethargy as well -Educated on Benefits of medication for symptom control Benefits of cognitive-behavioral therapy with or without medication -Recommended for patient to record what medications she is currently taking for depression / anxiety - once current medications are known, able to make adjustments if needed   Tobacco use (Goal tobacco cessation) -  Uncontrolled -Previous quit attempts: 14 years ago  -Current treatment   n/a -Patient smokes Within 30 minutes of waking -Patient triggers include: stress and finishing a meal -On a scale of 1-10, reports MOTIVATION to quit is 3 -On a scale of 1-10, reports CONFIDENCE in quitting is 3 -discussed with patient benefits for smoking cessation, reviewed with patient medication options for smoking cessation -Recommended for patient to determine a potential quit date for next appointment   GERD / diarrhea (Goal: Acid control / Flare prevention ) -Controlled -Current treatment  Lansoprazole 52m daily Imodium - 1 tablet daily (patient unsure of strength) Lomotil 2.5-0.025mg - 1 tablet 4 times daily if needed   Pepto-bismol - 342mdaily if needed  -Medications previously tried: n/a -Recommended to continue current medication - Diarrhea likely due to recent antibiotic course   Glaucoma (Goal: Pressure control / prevention of disease progression) -Controlled -Current treatment  Brimonidine 0.15% - 1 drop into each eye at bedtime  Dorzolamide -Timolol 22.3-6.8 mg/mL - 1 drop into both eyes twice daily  -Medications previously tried: n/a  -Recommended to continue current medication   Osteoarthritis / Spinal Stenosis (Goal: Pain control) -Not ideally controlled -Current treatment  IBU 40032m 1 tablet daily prn  -Medications previously tried: Arthritis Hot pain cream - reports regular use was able to control pain - unsure as to why she stopped using  -Recommended for patient to restart use of arthritis hot pain cream Educated on avoiding frequent use of NSAIDS in setting of reduced kidney function   Health Maintenance -Current therapy:  Multivitamin once daily  Calcium Carbonate - Vit D - 1200-1000m32m1 tablet daily  Magnesium 500mg58mly  -Educated on Cost vs benefit of each product must be carefully weighed by individual consumer -Patient is satisfied with current therapy and denies issues -Recommended to continue current medication   Patient  Goals/Self-Care Activities Patient will:  - take medications as prescribed focus on medication adherence by recording list of medications that she takes - continuing use of pill box/ reaching out to pharmacy about bubble packing medications and missing no days  check glucose daily , document, and provide at future appointments engage in dietary modifications by reducing sodium intake, moderation of carb intake   Follow Up Plan: Telephone follow up appointment with care management team member scheduled for: 3 weeks  The patient has been provided with contact information for the care management team and has been advised to call with any health related questions or concerns.         Medication Assistance: None required.  Patient affirms current coverage meets needs.  [facility] [place]: [phone] pill box? Yes Pt endorses 100% compliance - of prescribed medications   Care Plan and Follow Up Patient Decision:  Patient agrees to Care Plan and Follow-up.  Plan: Face to Face appointment with care management team member scheduled for: 1 month and The patient has been provided with contact information for the care management team and has been advised to call with any health related questions or concerns.   Lethargic since death of husband  Hallucinations x 7 days  Denies urinary symptoms  Not running fever  Foot remains swollen, but not longer hot to the touch

## 2021-02-27 ENCOUNTER — Ambulatory Visit (INDEPENDENT_AMBULATORY_CARE_PROVIDER_SITE_OTHER): Payer: Medicare Other | Admitting: Internal Medicine

## 2021-02-27 ENCOUNTER — Other Ambulatory Visit: Payer: Self-pay

## 2021-02-27 ENCOUNTER — Encounter: Payer: Self-pay | Admitting: Internal Medicine

## 2021-02-27 VITALS — BP 120/70 | HR 87 | Temp 98.0°F | Resp 18 | Ht 61.5 in | Wt 127.0 lb

## 2021-02-27 DIAGNOSIS — I10 Essential (primary) hypertension: Secondary | ICD-10-CM | POA: Diagnosis not present

## 2021-02-27 DIAGNOSIS — E118 Type 2 diabetes mellitus with unspecified complications: Secondary | ICD-10-CM | POA: Diagnosis not present

## 2021-02-27 DIAGNOSIS — J410 Simple chronic bronchitis: Secondary | ICD-10-CM

## 2021-02-27 DIAGNOSIS — R197 Diarrhea, unspecified: Secondary | ICD-10-CM

## 2021-02-27 DIAGNOSIS — F4329 Adjustment disorder with other symptoms: Secondary | ICD-10-CM

## 2021-02-27 DIAGNOSIS — R4789 Other speech disturbances: Secondary | ICD-10-CM

## 2021-02-27 LAB — CBC
HCT: 42.5 % (ref 36.0–46.0)
Hemoglobin: 14.1 g/dL (ref 12.0–15.0)
MCHC: 33.1 g/dL (ref 30.0–36.0)
MCV: 90.3 fl (ref 78.0–100.0)
Platelets: 333 10*3/uL (ref 150.0–400.0)
RBC: 4.7 Mil/uL (ref 3.87–5.11)
RDW: 14.1 % (ref 11.5–15.5)
WBC: 13.5 10*3/uL — ABNORMAL HIGH (ref 4.0–10.5)

## 2021-02-27 LAB — COMPREHENSIVE METABOLIC PANEL
ALT: 28 U/L (ref 0–35)
AST: 45 U/L — ABNORMAL HIGH (ref 0–37)
Albumin: 3.5 g/dL (ref 3.5–5.2)
Alkaline Phosphatase: 242 U/L — ABNORMAL HIGH (ref 39–117)
BUN: 25 mg/dL — ABNORMAL HIGH (ref 6–23)
CO2: 34 mEq/L — ABNORMAL HIGH (ref 19–32)
Calcium: 10.1 mg/dL (ref 8.4–10.5)
Chloride: 98 mEq/L (ref 96–112)
Creatinine, Ser: 0.74 mg/dL (ref 0.40–1.20)
GFR: 77.81 mL/min (ref >60.00)
Glucose, Bld: 137 mg/dL — ABNORMAL HIGH (ref 70–99)
Potassium: 4 mEq/L (ref 3.5–5.1)
Sodium: 141 mEq/L (ref 135–145)
Total Bilirubin: 1 mg/dL (ref 0.2–1.2)
Total Protein: 6.6 g/dL (ref 6.0–8.3)

## 2021-02-27 LAB — VITAMIN B12: Vitamin B-12: 411 pg/mL (ref 211–911)

## 2021-02-27 LAB — VITAMIN D 25 HYDROXY (VIT D DEFICIENCY, FRACTURES): VITD: 67.12 ng/mL (ref 30.00–100.00)

## 2021-02-27 LAB — TSH: TSH: 4.6 u[IU]/mL (ref 0.35–5.50)

## 2021-02-27 MED ORDER — DIPHENOXYLATE-ATROPINE 2.5-0.025 MG PO TABS: 1.0000 | ORAL_TABLET | Freq: Four times a day (QID) | ORAL | 2 refills | Status: AC | PRN

## 2021-02-27 NOTE — Progress Notes (Signed)
   Subjective:   Patient ID: Tracy Hammond, female    DOB: 07/05/1941, 80 y.o.   MRN: [id number]  HPI The patient is a 81 YO female coming in with daughter and son-in-law for several concerns about health overall and mental health. She is not coping well after loss of husband, not eating well, unable physically to do for herself. She is losing weight since then overall stable in the last few weeks. She is barely eating except when around others. She is having a lot of diarrhea which is chronic but worse lately. Daughter not sure she is taking pills appropriately. Trouble remembering and some slurring of the speech recently.   Review of Systems  Unable to perform ROS: Mental status change  Constitutional:  Positive for activity change, appetite change and unexpected weight change.  Neurological:  Positive for speech difficulty.  Psychiatric/Behavioral:  Positive for behavioral problems, decreased concentration and dysphoric mood.    Objective:  Physical Exam Constitutional:      Appearance: She is well-developed.     Comments: Moderate temporal wasting  HENT:     Head: Normocephalic and atraumatic.  Cardiovascular:     Rate and Rhythm: Normal rate and regular rhythm.  Pulmonary:     Effort: Pulmonary effort is normal. No respiratory distress.     Breath sounds: Rhonchi present. No wheezing or rales.     Comments: Stable lung exam Abdominal:     General: Bowel sounds are normal. There is no distension.     Palpations: Abdomen is soft.     Tenderness: There is no abdominal tenderness. There is no rebound.  Musculoskeletal:     Cervical back: Normal range of motion.  Skin:    General: Skin is warm and dry.     Findings: Lesion present.     Comments: Stable skin lesion on the face  Neurological:     Mental Status: She is alert.     Coordination: Coordination abnormal.     Comments: Not engaged in conversation which is not typical for patient, able to answer questions, good remote  knowledge, some gaps in short term memory evident    Vitals:   02/27/21 0911  BP: 120/70  Pulse: 87  Resp: 18  Temp: 98 F (36.7 C)  TempSrc: Oral  SpO2: 96%  Weight: 127 lb (57.6 kg)  Height: 5' 1.5" (1.562 m)    This visit occurred during the SARS-CoV-2 public health emergency.  Safety protocols were in place, including screening questions prior to the visit, additional usage of staff PPE, and extensive cleaning of exam room while observing appropriate contact time as indicated for disinfecting solutions.   Assessment & Plan:  Visit time 30 minutes in face to face communication with patient and coordination of care, additional 5 minutes spent in record review, coordination or care, ordering tests, communicating/referring to other healthcare professionals, documenting in medical records all on the same day of the visit for total time 35 minutes spent on the visit.

## 2021-02-27 NOTE — Patient Instructions (Addendum)
Stop taking the metformin as this could be causing the diarrhea.  Check if you are taking mirtazepine. If you are we will change the dose. If not make sure to take this daily to see if it will help.  We have sent in a refill of the diarrhea medicine that you can take up to 4 times a day as needed.

## 2021-02-28 ENCOUNTER — Other Ambulatory Visit: Payer: Self-pay | Admitting: Internal Medicine

## 2021-02-28 ENCOUNTER — Encounter: Payer: Self-pay | Admitting: Internal Medicine

## 2021-02-28 DIAGNOSIS — R4789 Other speech disturbances: Secondary | ICD-10-CM | POA: Insufficient documentation

## 2021-02-28 NOTE — Assessment & Plan Note (Signed)
Stable overall and no hypoxia or respiratory symptoms new on exam.

## 2021-02-28 NOTE — Assessment & Plan Note (Signed)
We will stop metformin due to diarrhea. Given poor appetite likely this treatment can wait until acute health crisis is resolved.

## 2021-02-28 NOTE — Assessment & Plan Note (Signed)
Refill lomotil and advised she can take more than once per day. Will stop metformin as this is likely contributing. She is taking xr form but this did not help significantly compared to IR form.

## 2021-02-28 NOTE — Assessment & Plan Note (Signed)
Offered head CT but she does not want this. Family will let me know if she changes her mind about this. Checking CBC, CMP, vitamin D and B12 to assess for metabolic cause. Checking TSH.

## 2021-02-28 NOTE — Assessment & Plan Note (Signed)
Counseled family to make sure she is eating with others to encourage oral intake. Asked them to verify if she is taking remeron 7.5 mg daily and if not to start and if so we will increase dosing. She is having some concerning features. Encouraged counseling.

## 2021-03-11 DIAGNOSIS — H353233 Exudative age-related macular degeneration, bilateral, with inactive scar: Secondary | ICD-10-CM | POA: Diagnosis not present

## 2021-03-11 DIAGNOSIS — H35371 Puckering of macula, right eye: Secondary | ICD-10-CM | POA: Diagnosis not present

## 2021-03-11 DIAGNOSIS — H43813 Vitreous degeneration, bilateral: Secondary | ICD-10-CM | POA: Diagnosis not present

## 2021-03-11 DIAGNOSIS — H43822 Vitreomacular adhesion, left eye: Secondary | ICD-10-CM | POA: Diagnosis not present

## 2021-03-14 ENCOUNTER — Other Ambulatory Visit: Payer: Self-pay | Admitting: Internal Medicine

## 2021-03-18 ENCOUNTER — Telehealth: Payer: Medicare Other

## 2021-03-23 ENCOUNTER — Emergency Department (HOSPITAL_COMMUNITY): Payer: Medicare Other

## 2021-03-23 ENCOUNTER — Emergency Department (HOSPITAL_COMMUNITY)
Admission: EM | Admit: 2021-03-23 | Discharge: 2021-03-23 | Disposition: A | Discharge status: Home / Self Care | Payer: Medicare Other | Attending: Emergency Medicine | Admitting: Emergency Medicine

## 2021-03-23 ENCOUNTER — Other Ambulatory Visit: Payer: Self-pay

## 2021-03-23 ENCOUNTER — Encounter (HOSPITAL_COMMUNITY): Payer: Self-pay | Admitting: Emergency Medicine

## 2021-03-23 DIAGNOSIS — K5900 Constipation, unspecified: Secondary | ICD-10-CM | POA: Insufficient documentation

## 2021-03-23 DIAGNOSIS — Z5321 Procedure and treatment not carried out due to patient leaving prior to being seen by health care provider: Secondary | ICD-10-CM | POA: Diagnosis not present

## 2021-03-23 DIAGNOSIS — R3915 Urgency of urination: Secondary | ICD-10-CM | POA: Diagnosis not present

## 2021-03-23 DIAGNOSIS — R194 Change in bowel habit: Secondary | ICD-10-CM | POA: Diagnosis present

## 2021-03-23 LAB — CBC WITH DIFFERENTIAL/PLATELET
Abs Immature Granulocytes: 0.06 10*3/uL (ref 0.00–0.07)
Basophils Absolute: 0 10*3/uL (ref 0.0–0.1)
Basophils Relative: 0 %
Eosinophils Absolute: 0 10*3/uL (ref 0.0–0.5)
Eosinophils Relative: 0 %
HCT: 48.3 % — ABNORMAL HIGH (ref 36.0–46.0)
Hemoglobin: 15.8 g/dL — ABNORMAL HIGH (ref 12.0–15.0)
Immature Granulocytes: 0 %
Lymphocytes Relative: 10 %
Lymphs Abs: 1.5 10*3/uL (ref 0.7–4.0)
MCH: 30.3 pg (ref 26.0–34.0)
MCHC: 32.7 g/dL (ref 30.0–36.0)
MCV: 92.7 fL (ref 80.0–100.0)
Monocytes Absolute: 0.6 10*3/uL (ref 0.1–1.0)
Monocytes Relative: 4 %
Neutro Abs: 13.6 10*3/uL — ABNORMAL HIGH (ref 1.7–7.7)
Neutrophils Relative %: 86 %
Platelets: 274 10*3/uL (ref 150–400)
RBC: 5.21 MIL/uL — ABNORMAL HIGH (ref 3.87–5.11)
RDW: 15.3 % (ref 11.5–15.5)
WBC: 15.9 10*3/uL — ABNORMAL HIGH (ref 4.0–10.5)
nRBC: 0 % (ref 0.0–0.2)

## 2021-03-23 LAB — COMPREHENSIVE METABOLIC PANEL
ALT: 35 U/L (ref 0–44)
AST: 47 U/L — ABNORMAL HIGH (ref 15–41)
Albumin: 3.4 g/dL — ABNORMAL LOW (ref 3.5–5.0)
Alkaline Phosphatase: 251 U/L — ABNORMAL HIGH (ref 38–126)
Anion gap: 13 (ref 5–15)
BUN: 23 mg/dL (ref 8–23)
CO2: 28 mmol/L (ref 22–32)
Calcium: 9 mg/dL (ref 8.9–10.3)
Chloride: 100 mmol/L (ref 98–111)
Creatinine, Ser: 0.78 mg/dL (ref 0.44–1.00)
GFR, Estimated: >60 mL/min (ref >60)
Glucose, Bld: 137 mg/dL — ABNORMAL HIGH (ref 70–99)
Potassium: 3 mmol/L — ABNORMAL LOW (ref 3.5–5.1)
Sodium: 141 mmol/L (ref 135–145)
Total Bilirubin: 1.2 mg/dL (ref 0.3–1.2)
Total Protein: 6.7 g/dL (ref 6.5–8.1)

## 2021-03-23 LAB — LIPASE, BLOOD: Lipase: 35 U/L (ref 11–51)

## 2021-03-23 NOTE — ED Triage Notes (Signed)
"  I've been constipated for 4 days."  She sates she has been unable to have a bowel movement in 4 days and the situation has caused a lot of pain. The pain causes increased urination per patient.

## 2021-03-23 NOTE — ED Provider Notes (Signed)
Emergency Medicine Provider Triage Evaluation Note  Tracy Hammond , a 80 y.o. female  was evaluated in triage.  Patient with complaints of constipation x 1 week. Last BM was 7 days prior, since then has been trying to self disimpact- has gotten some out, but feels like something is stuck in her rectum. Tried stool softener without relief. States her abdomen feels swollen not necessarily painful.   Review of Systems  Positive: Constipation, abdominal swelling.  Negative: Nausea, vomiting  Physical Exam  BP (!) 148/77   Pulse 77   Temp (!) 97.5 F (36.4 C) (Oral)   Resp (!) 22   SpO2 95%  Gen:   Awake, no distress   Resp:  Normal effort  MSK:   Moves extremities without difficulty  Other:  Mild abdominal distension, no focal tenderness.   Medical Decision Making  Medically screening exam initiated at 12:39 PM.  Appropriate orders placed.  [name] was informed that the remainder of the evaluation will be completed by another provider, this initial triage assessment does not replace that evaluation, and the importance of remaining in the ED until their evaluation is complete.  Constipation.    Leafy Kindle 03/23/21 1247    Valarie Merino, MD 03/23/21 1413

## 2021-03-24 ENCOUNTER — Telehealth: Payer: Self-pay

## 2021-03-24 NOTE — Telephone Encounter (Signed)
I only see lab results and no scan of her stomach. Her labs look like she could have an infection so I would recommend to go back to ER for evaluation and treatment.

## 2021-03-24 NOTE — Telephone Encounter (Signed)
pt daughter is calling and has stated pt was taken to the ED on yesterday. However, pt left after waiting over 7 hours to be seen and was told it is unknown when she will be seen. Pt did have blood work and a scan of her abdomen done while waiting to be seen.  **Pt has not been able to move her bowels in 5 days and is a lot of pain.  **Pt took imodium x5 days ago and pt has not taken any since nor used the bathroom as of yet. Miralax was given last night and this morning to try to help pt to use the bathroom.  ** Angla pts daughter/caregiver can be reached at [phone].

## 2021-03-24 NOTE — Telephone Encounter (Signed)
Updated callback # [phone] for Angla.

## 2021-03-24 NOTE — Telephone Encounter (Signed)
See below

## 2021-03-25 NOTE — Telephone Encounter (Signed)
Called [name]( patient's daughter). LDVM with Dr. Nathanial Millman recommendations for treatment. Office number was provided in case she has additional questions or concerns.

## 2021-03-26 NOTE — Telephone Encounter (Signed)
See below

## 2021-03-26 NOTE — Telephone Encounter (Signed)
[name] (patient daughter) called back to inform that patient has now had several Bms since previous phone call and visit to ED  [name] says patient needs in home health care and has contacted insurance in regards.. insurance says they will cover home care..would like a call back from [facility] or nurse about this [phone]

## 2021-03-31 ENCOUNTER — Telehealth: Payer: Self-pay

## 2021-03-31 DIAGNOSIS — F4329 Adjustment disorder with other symptoms: Secondary | ICD-10-CM

## 2021-03-31 DIAGNOSIS — E118 Type 2 diabetes mellitus with unspecified complications: Secondary | ICD-10-CM

## 2021-03-31 NOTE — Telephone Encounter (Signed)
Received a call from patient's daughter stating the her mother is in dire need of home health. She's not eating, she is still grieving the loss of her husband. Daughter is afraid that they her mother may die from all the weight loss and not eating. Please advise.

## 2021-03-31 NOTE — Telephone Encounter (Signed)
Referral placed to home health for nurse and aide. The nurse will be able to help teach about diseases and assess her but they do not administer medications. The aide will be able to help with household tasks (if the home health company is able to fulfill this, most home health companies are very short staffed right now and may not be able to accommodate this). If the home health is unable to provide aide the only other option for them is to work with a private pay company for home health aide or call insurance company to see if they can assist them with getting an aide. It is variable what home health aides can do to help patient.

## 2021-04-01 ENCOUNTER — Other Ambulatory Visit: Payer: Self-pay

## 2021-04-01 ENCOUNTER — Encounter (HOSPITAL_COMMUNITY): Payer: Self-pay

## 2021-04-01 ENCOUNTER — Inpatient Hospital Stay (HOSPITAL_COMMUNITY)
Admission: EM | Admit: 2021-04-01 | Discharge: 2021-05-01 | DRG: 436 | Disposition: E | Payer: Medicare Other | Attending: Internal Medicine | Admitting: Internal Medicine

## 2021-04-01 DIAGNOSIS — N179 Acute kidney failure, unspecified: Secondary | ICD-10-CM | POA: Diagnosis not present

## 2021-04-01 DIAGNOSIS — R531 Weakness: Secondary | ICD-10-CM | POA: Diagnosis not present

## 2021-04-01 DIAGNOSIS — R64 Cachexia: Secondary | ICD-10-CM | POA: Diagnosis present

## 2021-04-01 DIAGNOSIS — F32A Depression, unspecified: Secondary | ICD-10-CM | POA: Diagnosis present

## 2021-04-01 DIAGNOSIS — J811 Chronic pulmonary edema: Secondary | ICD-10-CM | POA: Diagnosis present

## 2021-04-01 DIAGNOSIS — R238 Other skin changes: Secondary | ICD-10-CM | POA: Diagnosis present

## 2021-04-01 DIAGNOSIS — Z6824 Body mass index (BMI) 24.0-24.9, adult: Secondary | ICD-10-CM

## 2021-04-01 DIAGNOSIS — E119 Type 2 diabetes mellitus without complications: Secondary | ICD-10-CM | POA: Diagnosis present

## 2021-04-01 DIAGNOSIS — Z8719 Personal history of other diseases of the digestive system: Secondary | ICD-10-CM

## 2021-04-01 DIAGNOSIS — I700 Atherosclerosis of aorta: Secondary | ICD-10-CM | POA: Diagnosis not present

## 2021-04-01 DIAGNOSIS — F419 Anxiety disorder, unspecified: Secondary | ICD-10-CM | POA: Diagnosis present

## 2021-04-01 DIAGNOSIS — R609 Edema, unspecified: Secondary | ICD-10-CM | POA: Diagnosis not present

## 2021-04-01 DIAGNOSIS — R627 Adult failure to thrive: Secondary | ICD-10-CM | POA: Diagnosis present

## 2021-04-01 DIAGNOSIS — R188 Other ascites: Secondary | ICD-10-CM

## 2021-04-01 DIAGNOSIS — R160 Hepatomegaly, not elsewhere classified: Secondary | ICD-10-CM

## 2021-04-01 DIAGNOSIS — Z8249 Family history of ischemic heart disease and other diseases of the circulatory system: Secondary | ICD-10-CM

## 2021-04-01 DIAGNOSIS — F17210 Nicotine dependence, cigarettes, uncomplicated: Secondary | ICD-10-CM | POA: Diagnosis present

## 2021-04-01 DIAGNOSIS — Z79899 Other long term (current) drug therapy: Secondary | ICD-10-CM

## 2021-04-01 DIAGNOSIS — K567 Ileus, unspecified: Secondary | ICD-10-CM | POA: Diagnosis present

## 2021-04-01 DIAGNOSIS — Y92009 Unspecified place in unspecified non-institutional (private) residence as the place of occurrence of the external cause: Secondary | ICD-10-CM

## 2021-04-01 DIAGNOSIS — E785 Hyperlipidemia, unspecified: Secondary | ICD-10-CM | POA: Diagnosis present

## 2021-04-01 DIAGNOSIS — K766 Portal hypertension: Secondary | ICD-10-CM | POA: Diagnosis present

## 2021-04-01 DIAGNOSIS — F4320 Adjustment disorder, unspecified: Secondary | ICD-10-CM | POA: Diagnosis present

## 2021-04-01 DIAGNOSIS — E876 Hypokalemia: Secondary | ICD-10-CM | POA: Diagnosis present

## 2021-04-01 DIAGNOSIS — I110 Hypertensive heart disease with heart failure: Secondary | ICD-10-CM | POA: Diagnosis present

## 2021-04-01 DIAGNOSIS — R600 Localized edema: Secondary | ICD-10-CM | POA: Diagnosis not present

## 2021-04-01 DIAGNOSIS — E1165 Type 2 diabetes mellitus with hyperglycemia: Secondary | ICD-10-CM | POA: Diagnosis present

## 2021-04-01 DIAGNOSIS — R5381 Other malaise: Secondary | ICD-10-CM | POA: Diagnosis not present

## 2021-04-01 DIAGNOSIS — Z515 Encounter for palliative care: Secondary | ICD-10-CM

## 2021-04-01 DIAGNOSIS — J189 Pneumonia, unspecified organism: Secondary | ICD-10-CM

## 2021-04-01 DIAGNOSIS — I10 Essential (primary) hypertension: Secondary | ICD-10-CM | POA: Diagnosis present

## 2021-04-01 DIAGNOSIS — I509 Heart failure, unspecified: Secondary | ICD-10-CM | POA: Diagnosis present

## 2021-04-01 DIAGNOSIS — Z66 Do not resuscitate: Secondary | ICD-10-CM | POA: Diagnosis not present

## 2021-04-01 DIAGNOSIS — K7460 Unspecified cirrhosis of liver: Secondary | ICD-10-CM | POA: Diagnosis present

## 2021-04-01 DIAGNOSIS — G2581 Restless legs syndrome: Secondary | ICD-10-CM | POA: Diagnosis present

## 2021-04-01 DIAGNOSIS — Z7984 Long term (current) use of oral hypoglycemic drugs: Secondary | ICD-10-CM

## 2021-04-01 DIAGNOSIS — K5730 Diverticulosis of large intestine without perforation or abscess without bleeding: Secondary | ICD-10-CM | POA: Diagnosis present

## 2021-04-01 DIAGNOSIS — C220 Liver cell carcinoma: Principal | ICD-10-CM | POA: Diagnosis present

## 2021-04-01 DIAGNOSIS — R1110 Vomiting, unspecified: Secondary | ICD-10-CM

## 2021-04-01 DIAGNOSIS — R54 Age-related physical debility: Secondary | ICD-10-CM | POA: Diagnosis present

## 2021-04-01 DIAGNOSIS — Z833 Family history of diabetes mellitus: Secondary | ICD-10-CM

## 2021-04-01 DIAGNOSIS — I468 Cardiac arrest due to other underlying condition: Secondary | ICD-10-CM | POA: Diagnosis not present

## 2021-04-01 DIAGNOSIS — R269 Unspecified abnormalities of gait and mobility: Secondary | ICD-10-CM | POA: Diagnosis present

## 2021-04-01 DIAGNOSIS — K219 Gastro-esophageal reflux disease without esophagitis: Secondary | ICD-10-CM | POA: Diagnosis present

## 2021-04-01 DIAGNOSIS — Z043 Encounter for examination and observation following other accident: Secondary | ICD-10-CM | POA: Diagnosis not present

## 2021-04-01 DIAGNOSIS — W19XXXA Unspecified fall, initial encounter: Secondary | ICD-10-CM | POA: Diagnosis present

## 2021-04-01 DIAGNOSIS — R935 Abnormal findings on diagnostic imaging of other abdominal regions, including retroperitoneum: Secondary | ICD-10-CM

## 2021-04-01 DIAGNOSIS — Z85828 Personal history of other malignant neoplasm of skin: Secondary | ICD-10-CM

## 2021-04-01 DIAGNOSIS — N320 Bladder-neck obstruction: Secondary | ICD-10-CM | POA: Diagnosis present

## 2021-04-01 DIAGNOSIS — Z7982 Long term (current) use of aspirin: Secondary | ICD-10-CM

## 2021-04-01 DIAGNOSIS — Z20822 Contact with and (suspected) exposure to covid-19: Secondary | ICD-10-CM | POA: Diagnosis present

## 2021-04-01 DIAGNOSIS — H409 Unspecified glaucoma: Secondary | ICD-10-CM | POA: Diagnosis present

## 2021-04-01 NOTE — ED Triage Notes (Signed)
Pt bib ems from home c/o leg sores that are weeping. Pt is taking fluid pills for about a week. She came in to the ER to have her legs looked at but left AMA.   Pt has weeping bilaterally.   Ems informed RN pt fell today. She hit right side of head. Pt denies LOC and takes 81 mg aspirin.   Pt has a laceration on right elbow. She states she got from doing tasks in the home.   Pt is AOX4

## 2021-04-01 NOTE — ED Provider Notes (Signed)
[facility] EMERGENCY DEPARTMENT Provider Note   CSN: [id number] Arrival date & time: 04/29/2021  2315     History No chief complaint on file.   Tracy Hammond is a 80 y.o. female.  The history is provided by the patient and a relative.  She has history of hypertension, diabetes, hyperlipidemia, venous insufficiency and comes in because of a fall at home.  She hit the right side of her head but there was no loss of consciousness.  She is on low-dose aspirin but no anticoagulants.  Mental status has been at her baseline since the fall.  She also has been having problems with blisters on her legs which have been weeping.  This has been a chronic problem and is not any worse now than it has been.  She is on furosemide 20 mg once a day for edema.  She denies fever or chills.   Past Medical History:  Diagnosis Date   Allergy    seasonal   Arthritis    fingers, back   Bursitis    Cancer ([place])    skin cancer arm and face   Cataract    surgery   Cough    smokers   Diabetes mellitus    GERD (gastroesophageal reflux disease)    Glaucoma    Hyperlipidemia    no meds currently 01/22/20   Hypertension    Personal history of colonic adenoma 06/12/2008   Restless leg    Thyroid disease    young adult-no meds now    Patient Active Problem List   Diagnosis Date Noted   Episode of change in speech 02/28/2021   Diabetic foot infection ([place]) 02/06/2021   Adjustment disorder 12/20/2020   Scalp lesion 12/20/2020   Spinal stenosis of lumbar region 03/19/2020   Venous insufficiency 03/30/2018   Unilateral primary osteoarthritis, right hip 12/29/2017   Smokers' cough ([place]) 11/13/2016   Routine general medical examination at a health care facility 09/05/2015   Diarrhea 03/15/2015   Restless leg syndrome 08/28/2014   VARICOSE VEINS, LOWER EXTREMITIES 05/14/2008   Hyperlipidemia associated with type 2 diabetes mellitus ([place]) 04/14/2007   Diabetes mellitus type 2 with  complications ([place]) 96/29/5284   Essential hypertension 03/29/2007    Past Surgical History:  Procedure Laterality Date   Bladder Tact     CATARACT EXTRACTION  09-07-2012   rt eye   CATARACT EXTRACTION Right 08/2012   CHOLECYSTECTOMY     COLONOSCOPY     EYE SURGERY     LUMBAR LAMINECTOMY/DECOMPRESSION MICRODISCECTOMY N/A 08/12/2020   Procedure: LUMBAR FOUR-FIVE DECOMPRESSION, LEFT LUMBAR FIVE-SACRAL ONE MICRODISCECTOMY;  Surgeon: Marybelle Killings, MD;  Location: [facility];  Service: Orthopedics;  Laterality: N/A;   SKIN CANCER EXCISION     TONSILLECTOMY AND ADENOIDECTOMY     TUBAL LIGATION       OB History   No obstetric history on file.     Family History  Problem Relation Age of Onset   Hypertension Father        family hx   Diabetes Mother    Kidney failure Mother    Heart disease Mother    Stroke Sister    Other Sister        Leg Disease   Heart disease Son    Colon cancer Neg Hx    Esophageal cancer Neg Hx    Rectal cancer Neg Hx    Stomach cancer Neg Hx     Social History   Tobacco  Use   Smoking status: Every Day    Packs/day: 0.50    Years: 60.00    Pack years: 30.00    Types: Cigarettes   Smokeless tobacco: Never  Vaping Use   Vaping Use: Never used  Substance Use Topics   Alcohol use: No   Drug use: No    Home Medications Prior to Admission medications   Medication Sig Start Date End Date Taking? Authorizing Provider  ALPRAZolam (XANAX) 0.25 MG tablet Take 1 tablet (0.25 mg total) by mouth 2 (two) times daily as needed for anxiety. 12/23/20   Hoyt Koch, MD  amoxicillin-clavulanate (AUGMENTIN) 875-125 MG tablet Take 1 tablet by mouth every 12 (twelve) hours. 02/02/21   Enrique Sack, FNP  aspirin 81 MG tablet Take 1 tablet (81 mg total) by mouth every morning. STOP TODAY 11/25, RESTART 08/08/2013 Patient taking differently: Take 81 mg by mouth every morning. 07/25/13   Gatha Mayer, MD  bismuth subsalicylate (PEPTO BISMOL) 262 MG/15ML  suspension Take 30 mLs by mouth every 6 (six) hours as needed.    [provider]  blood glucose meter kit and supplies KIT Dispense based on patient and insurance preference. Use daily as directed. 01/24/21   Hoyt Koch, MD  brimonidine (ALPHAGAN) 0.15 % ophthalmic solution Place 1 drop into both eyes in the morning and at bedtime. 01/15/15   [provider]  busPIRone (BUSPAR) 5 MG tablet TAKE 1 TABLET BY MOUTH ONCE DAILY AS NEEDED FOR ANXIETY 03/14/21   Hoyt Koch, MD  Calcium Carbonate-Vit D-Min (CALCIUM 1200) 1200-1000 MG-UNIT CHEW Chew 1 tablet by mouth daily.    [provider]  Cyanocobalamin 1500 MCG TBDP Take 1,500 mcg by mouth daily.    [provider]  diclofenac sodium (VOLTAREN) 1 % GEL APPLY A THIN LAYER TO AFFECTED AREA UP TO 4 TIMES A DAY AS NEEDED 03/25/18   Mcarthur Rossetti, MD  diphenoxylate-atropine (LOMOTIL) 2.5-0.025 MG tablet Take 1 tablet by mouth 4 (four) times daily as needed for diarrhea or loose stools. 02/27/21   Hoyt Koch, MD  dorzolamide-timolol (COSOPT) 22.3-6.8 MG/ML ophthalmic solution Place 1 drop into both eyes 2 (two) times daily. 01/08/20   [provider]  furosemide (LASIX) 20 MG tablet Take 1 tablet (20 mg total) by mouth daily. 12/19/20   Hoyt Koch, MD  gabapentin (NEURONTIN) 100 MG capsule Take 100 mg by mouth at bedtime.    [provider]  glipiZIDE (GLUCOTROL) 5 MG tablet TAKE 1 TABLET BY MOUTH BEFORE BREAKFAST 02/28/21   Hoyt Koch, MD  IBUPROFEN PO Take 1-3 tablets by mouth daily as needed. Takes 2-3 days out of the week    [provider]  lansoprazole (PREVACID) 30 MG capsule Take 30 mg by mouth every morning. 07/15/12 08/12/20  Swords, Darrick Penna, MD  lisinopril-hydrochlorothiazide (ZESTORETIC) 20-12.5 MG tablet Take 1 tablet by mouth daily. 01/13/21   Hoyt Koch, MD  Loperamide HCl (IMODIUM PO) Take 1 tablet by mouth daily as  needed (diarrhea).    [provider]  MAGNESIUM PO Take 500 mg by mouth daily.    [provider]  mirtazapine (REMERON) 15 MG tablet Take 1 tablet (15 mg total) by mouth at bedtime. 02/04/21   Hoyt Koch, MD  Multiple Vitamin (MULTIVITAMIN WITH MINERALS) TABS Take 1 tablet by mouth every morning.    [provider]  nystatin ointment (MYCOSTATIN) Apply topically 2 (two) times daily. Patient not taking: No  sig reported 07/19/20   Hoyt Koch, MD  [facility] VERIO test strip TAKE AS DIRECTED 02/21/21   Hoyt Koch, MD  potassium chloride SA (KLOR-CON) 20 MEQ tablet Take 1 tablet (20 mEq total) by mouth daily. 12/19/20   Hoyt Koch, MD  rOPINIRole (REQUIP) 2 MG tablet Take 1 tablet (2 mg total) by mouth daily. Patient not taking: No sig reported 09/25/19   Hoyt Koch, MD  tiZANidine (ZANAFLEX) 2 MG tablet TAKE 1 TABLET BY MOUTH ONCE DAILY AT BEDTIME 03/14/21   Hoyt Koch, MD  traZODone (DESYREL) 50 MG tablet Take 1-2 tablets (50-100 mg total) by mouth at bedtime as needed for sleep. 12/19/20   Hoyt Koch, MD    Allergies    Patient has no known allergies.  Review of Systems   Review of Systems  All other systems reviewed and are negative.  Physical Exam Updated Vital Signs BP 133/65 (BP Location: Right Arm)   Pulse 66   Resp 17   Ht _0  (1.549 m)   Wt 58.1 kg   SpO2 100%   BMI 24.19 kg/m   Physical Exam Vitals and nursing note reviewed.  80 year old female, resting comfortably and in no acute distress. Vital signs are normal. Oxygen saturation is 100%, which is normal. Head is normocephalic and atraumatic. PERRLA, EOMI. Oropharynx is clear. Neck is nontender and supple without adenopathy or JVD. Back is nontender and there is no CVA tenderness. Lungs are clear without rales, wheezes, or rhonchi. Chest is nontender. Heart has regular rate and rhythm without murmur. Abdomen is soft,  flat, nontender without masses or hepatosplenomegaly and peristalsis is normoactive. Extremities: There is 2+ pitting edema of both legs with bullae present bilaterally and generalized skin weeping. Skin is warm and dry without rash. Neurologic: Awake and alert, dysarthric speech noted, but speech content is normal, cranial nerves are intact, there are no motor or sensory deficits.   ED Results / Procedures / Treatments   Labs (all labs ordered are listed, but only abnormal results are displayed) Labs Reviewed  BASIC METABOLIC PANEL - Abnormal; Notable for the following components:      Result Value   Potassium 2.9 (*)    Glucose, Bld 107 (*)    BUN 39 (*)    Creatinine, Ser 1.47 (*)    Calcium 8.6 (*)    GFR, Estimated 37 (*)    All other components within normal limits  CBC WITH DIFFERENTIAL/PLATELET - Abnormal; Notable for the following components:   WBC 10.9 (*)    Neutro Abs 9.0 (*)    All other components within normal limits  BRAIN NATRIURETIC PEPTIDE - Abnormal; Notable for the following components:   B Natriuretic Peptide 191.1 (*)    All other components within normal limits  SARS CORONAVIRUS 2 (TAT 6-24 HRS)  MAGNESIUM   Radiology CT HEAD WO CONTRAST (5MM)  Result Date: 04/02/2021 CLINICAL DATA:  Fall EXAM: CT HEAD WITHOUT CONTRAST CT CERVICAL SPINE WITHOUT CONTRAST TECHNIQUE: Multidetector CT imaging of the head and cervical spine was performed following the standard protocol without intravenous contrast. Multiplanar CT image reconstructions of the cervical spine were also generated. COMPARISON:  None. FINDINGS: CT HEAD FINDINGS Brain: There is no mass, hemorrhage or extra-axial collection. There is generalized atrophy without lobar predilection. There is hypoattenuation of the periventricular white matter, most commonly indicating chronic ischemic microangiopathy. Vascular: No abnormal hyperdensity of the major intracranial arteries or dural venous sinuses. No intracranial  atherosclerosis. Skull: The visualized skull base, calvarium and extracranial soft tissues are normal. Sinuses/Orbits: No fluid levels or advanced mucosal thickening of the visualized paranasal sinuses. Left mastoid opacification. The orbits are normal. CT CERVICAL SPINE FINDINGS Alignment: No static subluxation. Facets are aligned. Occipital condyles are normally positioned. Skull base and vertebrae: No acute fracture. Soft tissues and spinal canal: No prevertebral fluid or swelling. No visible canal hematoma. Disc levels: No advanced spinal canal or neural foraminal stenosis. Upper chest: No pneumothorax, pulmonary nodule or pleural effusion. Other: Normal visualized paraspinal cervical soft tissues. IMPRESSION: 1. Chronic ischemic microangiopathy and generalized atrophy without acute intracranial abnormality. 2. No acute fracture or static subluxation of the cervical spine. Electronically Signed   By: Ulyses Jarred M.D.   On: 04/02/2021 01:40   CT Cervical Spine Wo Contrast  Result Date: 04/02/2021 CLINICAL DATA:  Fall EXAM: CT HEAD WITHOUT CONTRAST CT CERVICAL SPINE WITHOUT CONTRAST TECHNIQUE: Multidetector CT imaging of the head and cervical spine was performed following the standard protocol without intravenous contrast. Multiplanar CT image reconstructions of the cervical spine were also generated. COMPARISON:  None. FINDINGS: CT HEAD FINDINGS Brain: There is no mass, hemorrhage or extra-axial collection. There is generalized atrophy without lobar predilection. There is hypoattenuation of the periventricular white matter, most commonly indicating chronic ischemic microangiopathy. Vascular: No abnormal hyperdensity of the major intracranial arteries or dural venous sinuses. No intracranial atherosclerosis. Skull: The visualized skull base, calvarium and extracranial soft tissues are normal. Sinuses/Orbits: No fluid levels or advanced mucosal thickening of the visualized paranasal sinuses. Left mastoid  opacification. The orbits are normal. CT CERVICAL SPINE FINDINGS Alignment: No static subluxation. Facets are aligned. Occipital condyles are normally positioned. Skull base and vertebrae: No acute fracture. Soft tissues and spinal canal: No prevertebral fluid or swelling. No visible canal hematoma. Disc levels: No advanced spinal canal or neural foraminal stenosis. Upper chest: No pneumothorax, pulmonary nodule or pleural effusion. Other: Normal visualized paraspinal cervical soft tissues. IMPRESSION: 1. Chronic ischemic microangiopathy and generalized atrophy without acute intracranial abnormality. 2. No acute fracture or static subluxation of the cervical spine. Electronically Signed   By: Ulyses Jarred M.D.   On: 04/02/2021 01:40   DG Chest Port 1 View  Result Date: 04/02/2021 CLINICAL DATA:  Edema. EXAM: PORTABLE CHEST 1 VIEW COMPARISON:  09/28/2018 FINDINGS: Low lung volumes. The heart is normal in size. Aortic atherosclerosis. Probable mitral annulus calcifications. There is no pulmonary edema. No focal airspace disease. No pleural fluid or pneumothorax. No acute osseous abnormalities are seen. IMPRESSION: Low lung volumes without acute abnormality. Electronically Signed   By: Keith Rake M.D.   On: 04/02/2021 00:24    Procedures Procedures   Medications Ordered in ED Medications  potassium chloride 10 mEq in 100 mL IVPB (10 mEq Intravenous New Bag/Given 04/02/21 0421)  potassium chloride SA (KLOR-CON) CR tablet 40 mEq (40 mEq Oral Given 04/02/21 0423)    ED Course  I have reviewed the triage vital signs and the nursing notes.  Pertinent labs & imaging results that were available during my care of the patient were reviewed by me and considered in my medical decision making (see chart for details).   MDM Rules/Calculators/A&P                         Fall at home with minor head injury.  She will be sent for CT of head and cervical spine.  Peripheral edema with weeping.  Will check  electrolytes and BNP and chest x-ray.  She is on a rather low dose of furosemide and past renal function has been normal.  Anticipate discharging her on a higher dose of furosemide.  Old records are reviewed, and she has been followed by her primary care provider for her leg edema and poor circulation.  Chest x-ray shows no acute cardiopulmonary process.  Labs show elevated creatinine of 1.47 as well as low potassium of 2.9.  She is given oral and intravenous potassium.  Magnesium level was checked and is normal at 1.7.  Last creatinine on record was 0.78 on 7/24.  Given the rise in creatinine and hypokalemia, I do not feel she can safely be diuresed as an outpatient.  Case is discussed with Dr. [place] Crane of [facility], who agrees to admit the patient.  Final Clinical Impression(s) / ED Diagnoses Final diagnoses:  Peripheral edema  Hypokalemia  Acute kidney injury (nontraumatic) ([place])    Rx / DC Orders ED Discharge Orders     None        Delora Fuel, MD 73/57/89 [phone]

## 2021-04-01 NOTE — Telephone Encounter (Signed)
Called patient's daughter. LVM asking her to give our office a call back

## 2021-04-02 ENCOUNTER — Emergency Department (HOSPITAL_COMMUNITY): Payer: Medicare Other

## 2021-04-02 DIAGNOSIS — E876 Hypokalemia: Secondary | ICD-10-CM

## 2021-04-02 DIAGNOSIS — N179 Acute kidney failure, unspecified: Secondary | ICD-10-CM

## 2021-04-02 DIAGNOSIS — Z043 Encounter for examination and observation following other accident: Secondary | ICD-10-CM | POA: Diagnosis not present

## 2021-04-02 DIAGNOSIS — I700 Atherosclerosis of aorta: Secondary | ICD-10-CM | POA: Diagnosis not present

## 2021-04-02 DIAGNOSIS — J811 Chronic pulmonary edema: Secondary | ICD-10-CM | POA: Diagnosis not present

## 2021-04-02 DIAGNOSIS — R609 Edema, unspecified: Secondary | ICD-10-CM | POA: Diagnosis present

## 2021-04-02 DIAGNOSIS — E43 Unspecified severe protein-calorie malnutrition: Secondary | ICD-10-CM | POA: Diagnosis not present

## 2021-04-02 DIAGNOSIS — R600 Localized edema: Secondary | ICD-10-CM | POA: Diagnosis not present

## 2021-04-02 LAB — CBC
HCT: 49.1 % — ABNORMAL HIGH (ref 36.0–46.0)
Hemoglobin: 16.2 g/dL — ABNORMAL HIGH (ref 12.0–15.0)
MCH: 30.3 pg (ref 26.0–34.0)
MCHC: 33 g/dL (ref 30.0–36.0)
MCV: 91.9 fL (ref 80.0–100.0)
Platelets: 204 10*3/uL (ref 150–400)
RBC: 5.34 MIL/uL — ABNORMAL HIGH (ref 3.87–5.11)
RDW: 15.4 % (ref 11.5–15.5)
WBC: 15.7 10*3/uL — ABNORMAL HIGH (ref 4.0–10.5)
nRBC: 0 % (ref 0.0–0.2)

## 2021-04-02 LAB — PHOSPHORUS: Phosphorus: 3.2 mg/dL (ref 2.5–4.6)

## 2021-04-02 LAB — CBC WITH DIFFERENTIAL/PLATELET
Abs Immature Granulocytes: 0.04 10*3/uL (ref 0.00–0.07)
Basophils Absolute: 0 10*3/uL (ref 0.0–0.1)
Basophils Relative: 0 %
Eosinophils Absolute: 0 10*3/uL (ref 0.0–0.5)
Eosinophils Relative: 0 %
HCT: 44.6 % (ref 36.0–46.0)
Hemoglobin: 15 g/dL (ref 12.0–15.0)
Immature Granulocytes: 0 %
Lymphocytes Relative: 12 %
Lymphs Abs: 1.4 10*3/uL (ref 0.7–4.0)
MCH: 30.9 pg (ref 26.0–34.0)
MCHC: 33.6 g/dL (ref 30.0–36.0)
MCV: 92 fL (ref 80.0–100.0)
Monocytes Absolute: 0.5 10*3/uL (ref 0.1–1.0)
Monocytes Relative: 4 %
Neutro Abs: 9 10*3/uL — ABNORMAL HIGH (ref 1.7–7.7)
Neutrophils Relative %: 84 %
Platelets: 150 10*3/uL (ref 150–400)
RBC: 4.85 MIL/uL (ref 3.87–5.11)
RDW: 15.2 % (ref 11.5–15.5)
WBC: 10.9 10*3/uL — ABNORMAL HIGH (ref 4.0–10.5)
nRBC: 0 % (ref 0.0–0.2)

## 2021-04-02 LAB — BASIC METABOLIC PANEL
Anion gap: 12 (ref 5–15)
Anion gap: 12 (ref 5–15)
BUN: 39 mg/dL — ABNORMAL HIGH (ref 8–23)
BUN: 36 mg/dL — ABNORMAL HIGH (ref 8–23)
CO2: 29 mmol/L (ref 22–32)
CO2: 28 mmol/L (ref 22–32)
Calcium: 8.6 mg/dL — ABNORMAL LOW (ref 8.9–10.3)
Calcium: 8.6 mg/dL — ABNORMAL LOW (ref 8.9–10.3)
Chloride: 98 mmol/L (ref 98–111)
Chloride: 98 mmol/L (ref 98–111)
Creatinine, Ser: 1.47 mg/dL — ABNORMAL HIGH (ref 0.44–1.00)
Creatinine, Ser: 1.22 mg/dL — ABNORMAL HIGH (ref 0.44–1.00)
GFR, Estimated: 37 mL/min — ABNORMAL LOW (ref >60)
GFR, Estimated: 46 mL/min — ABNORMAL LOW (ref >60)
Glucose, Bld: 107 mg/dL — ABNORMAL HIGH (ref 70–99)
Glucose, Bld: 73 mg/dL (ref 70–99)
Potassium: 2.9 mmol/L — ABNORMAL LOW (ref 3.5–5.1)
Potassium: 4.3 mmol/L (ref 3.5–5.1)
Sodium: 139 mmol/L (ref 135–145)
Sodium: 138 mmol/L (ref 135–145)

## 2021-04-02 LAB — BRAIN NATRIURETIC PEPTIDE: B Natriuretic Peptide: 191.1 pg/mL — ABNORMAL HIGH (ref 0.0–100.0)

## 2021-04-02 LAB — SARS CORONAVIRUS 2 (TAT 6-24 HRS): SARS Coronavirus 2: NEGATIVE

## 2021-04-02 LAB — MAGNESIUM: Magnesium: 1.7 mg/dL (ref 1.7–2.4)

## 2021-04-02 MED ORDER — POTASSIUM CHLORIDE 10 MEQ/100ML IV SOLN
10.0000 meq | INTRAVENOUS | Status: AC
  Administered 2021-04-02 (×2): 10 meq via INTRAVENOUS
  Filled 2021-04-02 (×2): qty 100

## 2021-04-02 MED ORDER — ENOXAPARIN SODIUM 30 MG/0.3ML IJ SOSY
30.0000 mg | PREFILLED_SYRINGE | INTRAMUSCULAR | Status: DC
  Administered 2021-04-02 – 2021-04-05 (×4): 30 mg via SUBCUTANEOUS
  Filled 2021-04-02 (×4): qty 0.3

## 2021-04-02 MED ORDER — BRIMONIDINE TARTRATE 0.2 % OP SOLN
1.0000 [drp] | Freq: Three times a day (TID) | OPHTHALMIC | Status: DC
  Administered 2021-04-03 – 2021-04-06 (×9): 1 [drp] via OPHTHALMIC
  Filled 2021-04-02: qty 5

## 2021-04-02 MED ORDER — POTASSIUM CHLORIDE CRYS ER 20 MEQ PO TBCR
20.0000 meq | EXTENDED_RELEASE_TABLET | Freq: Every day | ORAL | Status: DC
  Administered 2021-04-02 – 2021-04-03 (×2): 20 meq via ORAL
  Filled 2021-04-02 (×2): qty 1

## 2021-04-02 MED ORDER — MIRTAZAPINE 15 MG PO TABS
15.0000 mg | ORAL_TABLET | Freq: Every day | ORAL | Status: DC
  Administered 2021-04-02 – 2021-04-05 (×4): 15 mg via ORAL
  Filled 2021-04-02 (×5): qty 1

## 2021-04-02 MED ORDER — ASPIRIN EC 81 MG PO TBEC
81.0000 mg | DELAYED_RELEASE_TABLET | Freq: Every morning | ORAL | Status: DC
  Administered 2021-04-02 – 2021-04-05 (×4): 81 mg via ORAL
  Filled 2021-04-02 (×3): qty 1

## 2021-04-02 MED ORDER — ADULT MULTIVITAMIN W/MINERALS CH
1.0000 | ORAL_TABLET | Freq: Every morning | ORAL | Status: DC
  Administered 2021-04-02 – 2021-04-05 (×4): 1 via ORAL
  Filled 2021-04-02 (×4): qty 1

## 2021-04-02 MED ORDER — FUROSEMIDE 20 MG PO TABS
20.0000 mg | ORAL_TABLET | Freq: Every day | ORAL | Status: DC
  Administered 2021-04-02 – 2021-04-03 (×2): 20 mg via ORAL
  Filled 2021-04-02 (×2): qty 1

## 2021-04-02 MED ORDER — POTASSIUM CHLORIDE CRYS ER 20 MEQ PO TBCR
40.0000 meq | EXTENDED_RELEASE_TABLET | Freq: Once | ORAL | Status: AC
  Administered 2021-04-02: 40 meq via ORAL
  Filled 2021-04-02: qty 2

## 2021-04-02 MED ORDER — DORZOLAMIDE HCL-TIMOLOL MAL 2-0.5 % OP SOLN
1.0000 [drp] | Freq: Two times a day (BID) | OPHTHALMIC | Status: DC
  Administered 2021-04-03 – 2021-04-05 (×6): 1 [drp] via OPHTHALMIC
  Filled 2021-04-02: qty 10

## 2021-04-02 NOTE — Consult Note (Signed)
[facility] Nurse Consult Note: Reason for Consult: Bilateral LEs with serum filled blisters, intact and ruptured. Photos in EMR Wound type: partial thickness, infectious vs fluid overload Pressure Injury POA: N/A Measurement:To be obtained by Bedside RN with first dressing change today Wound bed: dry, red Drainage (amount, consistency, odor) none Periwound: mild erythema, edema Dressing procedure/placement/frequency: I will implement a POC with aims of being moisture retentive, antimicrobial and nonadherent. Twice daily xeroform gauze is selected as wound contact layer with topper of ABD and securement with toe-to-knee Kerlix roll gauze/ACE bandage for gentle compression.  Heels are to be floated and sacral prophylactic foam used for PI prevention.  [facility] nursing team will not follow, but will remain available to this patient, the nursing and medical teams.  Please re-consult if needed. Thanks, Maudie Flakes, MSN, RN, [place], Arther Abbott  Pager# [phone]

## 2021-04-02 NOTE — Progress Notes (Signed)
Pt was seen for mobility on side of bed and to practice transfers, but is quite weak.  In checking her vitals given the weakness, she had BP of 169/123.  Reported to nursing and they planned to recheck her to see how the numbers looked.  Follow her along to work on strength and tolerances for standing and gait, and recommending SNF since she is unable to move herself along as prior to the admission.  Pt has been progressively getting weaker, and will benefit from the stay to deal with intake, endurance and safety concerns.    04/02/21 1100  PT Visit Information  Last PT Received On 04/02/21  Assistance Needed +1 (2 for a longer walk)  History of Present Illness 80 yo female with onset of LE edema and wgt loss, FTT after husband's death in 2020/11/14 was admitted to [facility].  Sustained a fall preadmit, becoming weaker since intake and skin changes occurred.  PMHx:  OA, HTN, DM, adjustment disorder, glaucoma, GERD, colonic adenoma, thyroid disease, cataracts, cough, bursitis, skin CA  Precautions  Precautions Fall  Precaution Comments monitor BP, running high  Restrictions  Weight Bearing Restrictions No  Other Position/Activity Restrictions B LE skin wounds with fragile blisters  Home Living  Family/patient expects to be discharged to: Private residence  Living Arrangements Alone  Available Help at Discharge Family  Type of [place] to enter  Entrance Stairs-Number of Steps 4  Entrance Stairs-Rails Can reach both  [place] One level  [place] - 2 wheels  Additional Comments daughter is involved with her care  Prior Function  Level of Independence Independent  Comments doesnt drive due to macular degeneration  Communication  Communication No difficulties  Pain Assessment  Pain Assessment No/denies pain  Cognition  Arousal/Alertness Lethargic  Behavior During Therapy Flat affect  Overall Cognitive Status No  family/caregiver present to determine baseline cognitive functioning (daughter was answering questions for her initially in ED, not present for this eval)  Upper Extremity Assessment  Upper Extremity Assessment Generalized weakness  Lower Extremity Assessment  Lower Extremity Assessment Generalized weakness  Cervical / Trunk Assessment  Cervical / Trunk Assessment Kyphotic  Bed Mobility  Overal bed mobility Needs Assistance  Bed Mobility Supine to Sit;Sit to Supine  Supine to sit Mod assist  Sit to supine Max assist  Transfers  Overall transfer level Needs assistance  Equipment used 1 person hand held assist  Transfers Sit to/from Stand;Stand Pivot Transfers  Sit to Stand Mod assist  Stand pivot transfers Mod assist  General transfer comment mod to power up and pivot to chair, lethargic effort to pull up to stand  Ambulation/Gait  General Gait Details unable  Balance  Overall balance assessment Needs assistance  Sitting-balance support Bilateral upper extremity supported;Feet supported  Sitting balance-Leahy Scale Fair  Standing balance support Bilateral upper extremity supported;During functional activity  Standing balance-Leahy Scale Poor  General Comments  General comments (skin integrity, edema, etc.) pt was assisted to pivot to chair and checked her BP due to exertion she demonstrates, but got 169/123.  Returned her to bed and notified nursing  PT - End of Session  Equipment Utilized During Treatment Gait belt  Activity Tolerance Patient limited by fatigue;Treatment limited secondary to medical complications (Comment)  Patient left in bed;with call bell/phone within reach  Nurse Communication Mobility status;Other (comment) (BP)  PT Assessment  PT Recommendation/Assessment Patient needs continued PT services  PT Visit Diagnosis  Unsteadiness on feet (R26.81);Muscle weakness (generalized) (M62.81);Difficulty in walking, not elsewhere classified (R26.2);Other  (comment) (Hypertension)  PT Problem List Decreased strength;Decreased activity tolerance;Decreased balance;Decreased mobility;Decreased knowledge of use of DME;Cardiopulmonary status limiting activity;Decreased skin integrity;Pain  Barriers to Discharge Inaccessible home environment  Barriers to Discharge Comments has to climb steps to get into her house  PT Plan  PT Frequency (ACUTE ONLY) Min 3X/week  PT Treatment/Interventions (ACUTE ONLY) DME instruction;Gait training;Stair training;Functional mobility training;Therapeutic activities;Therapeutic exercise;Balance training;Neuromuscular re-education;Patient/family education  AM-PAC PT "6 Clicks" Mobility Outcome Measure (Version 2)  Help needed turning from your back to your side while in a flat bed without using bedrails? 3  Help needed moving from lying on your back to sitting on the side of a flat bed without using bedrails? 3  Help needed moving to and from a bed to a chair (including a wheelchair)? 2  Help needed standing up from a chair using your arms (e.g., wheelchair or bedside chair)? 2  Help needed to walk in hospital room? 1  Help needed climbing 3-5 steps with a railing?  1  6 Click Score 12  Consider Recommendation of Discharge To: CIR/SNF/LTACH  Progressive Mobility  What is the highest level of mobility based on the progressive mobility assessment? Level 3 (Stands with assist) - Balance while standing  and cannot march in place  Mobility Out of bed to chair with meals  PT Recommendation  Follow Up Recommendations SNF  PT equipment None recommended by PT  Individuals Consulted  Consulted and Agree with Results and Recommendations Patient  Acute Rehab PT Goals  Patient Stated Goal none stated  PT Goal Formulation With patient  Time For Goal Achievement 04/16/21  Potential to Achieve Goals Good  PT Time Calculation  PT Start Time (ACUTE ONLY) 1019  PT Stop Time (ACUTE ONLY) 1044  PT Time Calculation (min) (ACUTE ONLY)  25 min  PT General Charges  $$ ACUTE PT VISIT 1 Visit  PT Evaluation  $PT Eval Moderate Complexity 1 Mod  PT Treatments  $Therapeutic Activity 8-22 mins  Written Expression  Dominant Hand Right    Mee Hives, PT MS Acute Rehab Dept. Number: [place] and [place]

## 2021-04-02 NOTE — ED Notes (Signed)
RN Placed food tray in arm's reach of pt.  Pt states, " I am not hungry". RN to continue to round.

## 2021-04-02 NOTE — ED Notes (Signed)
Room tray ordered.

## 2021-04-02 NOTE — ED Notes (Signed)
PT Linen changed, purewick applied, warm blankets given .

## 2021-04-02 NOTE — H&P (Addendum)
History and Physical  Tracy Hammond CWU:[id number] DOB: 1940-12-05 DOA: 04/26/2021  Referring physician: Dr. Roxanne Mins, [place]. PCP: Hoyt Koch, MD  Outpatient Specialists: Orthopedic surgery, GI, cardiology. Patient coming from: Home, and if she lives with her.  Chief Complaint: Fall and legs swelling.  HPI: Tracy Hammond is a 80 y.o. female with medical history significant for essential hypertension, type 2 diabetes, adjustment disorder, recently lost her husband in February 22 in the hospital who presented from home after a fall, her daughter thinks she fell because of the severe bilateral lower extremity edema.  Per her daughter she has lost significant amount of weight due to poor appetite and oral intake after the death of her husband.  Per her daughter at bedside she has been on Lasix for 4 months but continues to have bilateral lower extremity edema.  EMS was activated.  She was brought to the ED for further evaluation.  3+ pitting edema in lower extremities bilaterally on exam along with blisters diffusely in her lower extremities bilaterally.  Work-up in the ED revealed edema and electrolyte disturbances with hypokalemia 2.9, repleted orally and intravenously, AKI.  EDP requested admission for diuresing and for electrolytes replacement.  ED Course: Temperature 97.5.  BP 148/85, pulse 78, respiratory 18, O2 saturation 97% on room air.  Lab studies remarkable for serum potassium 2.9, creatinine 1.47 with baseline creatinine of six 0.78-week ago.  BNP 191.  WBC 10.9.  Review of Systems: Review of systems as noted in the HPI. All other systems reviewed and are negative.   Past Medical History:  Diagnosis Date   Allergy    seasonal   Arthritis    fingers, back   Bursitis    Cancer ([place])    skin cancer arm and face   Cataract    surgery   Cough    smokers   Diabetes mellitus    GERD (gastroesophageal reflux disease)    Glaucoma    Hyperlipidemia    no meds currently  01/22/20   Hypertension    Personal history of colonic adenoma 06/12/2008   Restless leg    Thyroid disease    young adult-no meds now   Past Surgical History:  Procedure Laterality Date   Bladder Tact     CATARACT EXTRACTION  09-07-2012   rt eye   CATARACT EXTRACTION Right 08/2012   CHOLECYSTECTOMY     COLONOSCOPY     EYE SURGERY     LUMBAR LAMINECTOMY/DECOMPRESSION MICRODISCECTOMY N/A 08/12/2020   Procedure: LUMBAR FOUR-FIVE DECOMPRESSION, LEFT LUMBAR FIVE-SACRAL ONE MICRODISCECTOMY;  Surgeon: Marybelle Killings, MD;  Location: [facility];  Service: Orthopedics;  Laterality: N/A;   SKIN CANCER EXCISION     TONSILLECTOMY AND ADENOIDECTOMY     TUBAL LIGATION      Social History:  reports that she has been smoking cigarettes. She has a 30.00 pack-year smoking history. She has never used smokeless tobacco. She reports that she does not drink alcohol and does not use drugs.   No Known Allergies  Family History  Problem Relation Age of Onset   Hypertension Father        family hx   Diabetes Mother    Kidney failure Mother    Heart disease Mother    Stroke Sister    Other Sister        Leg Disease   Heart disease Son    Colon cancer Neg Hx    Esophageal cancer Neg Hx    Rectal cancer  Neg Hx    Stomach cancer Neg Hx       Prior to Admission medications   Medication Sig Start Date End Date Taking? Authorizing Provider  [facility] VERIO test strip TAKE AS DIRECTED 02/21/21  Yes Hoyt Koch, MD  ALPRAZolam Duanne Moron) 0.25 MG tablet Take 1 tablet (0.25 mg total) by mouth 2 (two) times daily as needed for anxiety. 12/23/20   Hoyt Koch, MD  amoxicillin-clavulanate (AUGMENTIN) 875-125 MG tablet Take 1 tablet by mouth every 12 (twelve) hours. 02/02/21   Enrique Sack, FNP  aspirin 81 MG tablet Take 1 tablet (81 mg total) by mouth every morning. STOP TODAY 11/25, RESTART 08/08/2013 Patient taking differently: Take 81 mg by mouth every morning. 07/25/13   Gatha Mayer, MD   bismuth subsalicylate (PEPTO BISMOL) 262 MG/15ML suspension Take 30 mLs by mouth every 6 (six) hours as needed.    [provider]  blood glucose meter kit and supplies KIT Dispense based on patient and insurance preference. Use daily as directed. 01/24/21   Hoyt Koch, MD  brimonidine (ALPHAGAN) 0.15 % ophthalmic solution Place 1 drop into both eyes in the morning and at bedtime. 01/15/15   [provider]  busPIRone (BUSPAR) 5 MG tablet TAKE 1 TABLET BY MOUTH ONCE DAILY AS NEEDED FOR ANXIETY 03/14/21   Hoyt Koch, MD  Calcium Carbonate-Vit D-Min (CALCIUM 1200) 1200-1000 MG-UNIT CHEW Chew 1 tablet by mouth daily.    [provider]  Cyanocobalamin 1500 MCG TBDP Take 1,500 mcg by mouth daily.    [provider]  diclofenac sodium (VOLTAREN) 1 % GEL APPLY A THIN LAYER TO AFFECTED AREA UP TO 4 TIMES A DAY AS NEEDED 03/25/18   Mcarthur Rossetti, MD  diphenoxylate-atropine (LOMOTIL) 2.5-0.025 MG tablet Take 1 tablet by mouth 4 (four) times daily as needed for diarrhea or loose stools. 02/27/21   Hoyt Koch, MD  dorzolamide-timolol (COSOPT) 22.3-6.8 MG/ML ophthalmic solution Place 1 drop into both eyes 2 (two) times daily. 01/08/20   [provider]  furosemide (LASIX) 20 MG tablet Take 1 tablet (20 mg total) by mouth daily. 12/19/20   Hoyt Koch, MD  gabapentin (NEURONTIN) 100 MG capsule Take 100 mg by mouth at bedtime.    [provider]  glipiZIDE (GLUCOTROL) 5 MG tablet TAKE 1 TABLET BY MOUTH BEFORE BREAKFAST 02/28/21   Hoyt Koch, MD  IBUPROFEN PO Take 1-3 tablets by mouth daily as needed. Takes 2-3 days out of the week    [provider]  lansoprazole (PREVACID) 30 MG capsule Take 30 mg by mouth every morning. 07/15/12 08/12/20  Swords, Darrick Penna, MD  lisinopril-hydrochlorothiazide (ZESTORETIC) 20-12.5 MG tablet Take 1 tablet by mouth daily. 01/13/21   Hoyt Koch, MD   Loperamide HCl (IMODIUM PO) Take 1 tablet by mouth daily as needed (diarrhea).    [provider]  MAGNESIUM PO Take 500 mg by mouth daily.    [provider]  mirtazapine (REMERON) 15 MG tablet Take 1 tablet (15 mg total) by mouth at bedtime. 02/04/21   Hoyt Koch, MD  Multiple Vitamin (MULTIVITAMIN WITH MINERALS) TABS Take 1 tablet by mouth every morning.    [provider]  nystatin ointment (MYCOSTATIN) Apply topically 2 (two) times daily. Patient not taking: No sig reported 07/19/20   Hoyt Koch, MD  potassium chloride SA (KLOR-CON) 20 MEQ tablet Take 1 tablet (20 mEq total) by mouth daily. 12/19/20   Pricilla Holm  A, MD  rOPINIRole (REQUIP) 2 MG tablet Take 1 tablet (2 mg total) by mouth daily. Patient not taking: No sig reported 09/25/19   Hoyt Koch, MD  tiZANidine (ZANAFLEX) 2 MG tablet TAKE 1 TABLET BY MOUTH ONCE DAILY AT BEDTIME 03/14/21   Hoyt Koch, MD  traZODone (DESYREL) 50 MG tablet Take 1-2 tablets (50-100 mg total) by mouth at bedtime as needed for sleep. 12/19/20   Hoyt Koch, MD    Physical Exam: BP 113/64   Pulse 66   Resp 14   Ht _0  (1.549 m)   Wt 58.1 kg   SpO2 94%   BMI 24.19 kg/m   General: 80 y.o. year-old female very frail-appearing.  In no acute distress.  Alert and minimally interactive. Cardiovascular: Regular rate and rhythm with no rubs or gallops.  No thyromegaly or JVD noted.  3+ pitting edema in lower extremities bilaterally.   Respiratory: Clear to auscultation with no wheezes or rales. Good inspiratory effort. Abdomen: Soft nontender nondistended with normal bowel sounds x4 quadrants. Muskuloskeletal: No cyanosis or clubbing.  2+ pitting edema lower extremities bilaterally along with blisters with weeping. Neuro: CN II-XII intact, strength, sensation, reflexes. Skin: Blisters affecting lower extremities bilaterally, weeping.       Psychiatry: Minimally  interactive.          Labs on Admission:  Basic Metabolic Panel: Recent Labs  Lab 04/08/2021 2357 04/02/21 0059  NA 139  --   K 2.9*  --   CL 98  --   CO2 29  --   GLUCOSE 107*  --   BUN 39*  --   CREATININE 1.47*  --   CALCIUM 8.6*  --   MG  --  1.7   Liver Function Tests: No results for input(s): AST, ALT, ALKPHOS, BILITOT, PROT, ALBUMIN in the last 168 hours. No results for input(s): LIPASE, AMYLASE in the last 168 hours. No results for input(s): AMMONIA in the last 168 hours. CBC: Recent Labs  Lab 04/29/2021 2357  WBC 10.9*  NEUTROABS 9.0*  HGB 15.0  HCT 44.6  MCV 92.0  PLT 150   Cardiac Enzymes: No results for input(s): CKTOTAL, CKMB, CKMBINDEX, TROPONINI in the last 168 hours.  BNP (last 3 results) Recent Labs    04/18/2021 2357  BNP 191.1*    ProBNP (last 3 results) Recent Labs    01/13/21 1135  PROBNP 351.0*    CBG: No results for input(s): GLUCAP in the last 168 hours.  Radiological Exams on Admission: CT HEAD WO CONTRAST (5MM)  Result Date: 04/02/2021 CLINICAL DATA:  Fall EXAM: CT HEAD WITHOUT CONTRAST CT CERVICAL SPINE WITHOUT CONTRAST TECHNIQUE: Multidetector CT imaging of the head and cervical spine was performed following the standard protocol without intravenous contrast. Multiplanar CT image reconstructions of the cervical spine were also generated. COMPARISON:  None. FINDINGS: CT HEAD FINDINGS Brain: There is no mass, hemorrhage or extra-axial collection. There is generalized atrophy without lobar predilection. There is hypoattenuation of the periventricular white matter, most commonly indicating chronic ischemic microangiopathy. Vascular: No abnormal hyperdensity of the major intracranial arteries or dural venous sinuses. No intracranial atherosclerosis. Skull: The visualized skull base, calvarium and extracranial soft tissues are normal. Sinuses/Orbits: No fluid levels or advanced mucosal thickening of the visualized paranasal sinuses. Left  mastoid opacification. The orbits are normal. CT CERVICAL SPINE FINDINGS Alignment: No static subluxation. Facets are aligned. Occipital condyles are normally positioned. Skull base and vertebrae: No acute fracture. Soft tissues and  spinal canal: No prevertebral fluid or swelling. No visible canal hematoma. Disc levels: No advanced spinal canal or neural foraminal stenosis. Upper chest: No pneumothorax, pulmonary nodule or pleural effusion. Other: Normal visualized paraspinal cervical soft tissues. IMPRESSION: 1. Chronic ischemic microangiopathy and generalized atrophy without acute intracranial abnormality. 2. No acute fracture or static subluxation of the cervical spine. Electronically Signed   By: Ulyses Jarred M.D.   On: 04/02/2021 01:40   CT Cervical Spine Wo Contrast  Result Date: 04/02/2021 CLINICAL DATA:  Fall EXAM: CT HEAD WITHOUT CONTRAST CT CERVICAL SPINE WITHOUT CONTRAST TECHNIQUE: Multidetector CT imaging of the head and cervical spine was performed following the standard protocol without intravenous contrast. Multiplanar CT image reconstructions of the cervical spine were also generated. COMPARISON:  None. FINDINGS: CT HEAD FINDINGS Brain: There is no mass, hemorrhage or extra-axial collection. There is generalized atrophy without lobar predilection. There is hypoattenuation of the periventricular white matter, most commonly indicating chronic ischemic microangiopathy. Vascular: No abnormal hyperdensity of the major intracranial arteries or dural venous sinuses. No intracranial atherosclerosis. Skull: The visualized skull base, calvarium and extracranial soft tissues are normal. Sinuses/Orbits: No fluid levels or advanced mucosal thickening of the visualized paranasal sinuses. Left mastoid opacification. The orbits are normal. CT CERVICAL SPINE FINDINGS Alignment: No static subluxation. Facets are aligned. Occipital condyles are normally positioned. Skull base and vertebrae: No acute fracture. Soft  tissues and spinal canal: No prevertebral fluid or swelling. No visible canal hematoma. Disc levels: No advanced spinal canal or neural foraminal stenosis. Upper chest: No pneumothorax, pulmonary nodule or pleural effusion. Other: Normal visualized paraspinal cervical soft tissues. IMPRESSION: 1. Chronic ischemic microangiopathy and generalized atrophy without acute intracranial abnormality. 2. No acute fracture or static subluxation of the cervical spine. Electronically Signed   By: Ulyses Jarred M.D.   On: 04/02/2021 01:40   DG Chest Port 1 View  Result Date: 04/02/2021 CLINICAL DATA:  Edema. EXAM: PORTABLE CHEST 1 VIEW COMPARISON:  09/28/2018 FINDINGS: Low lung volumes. The heart is normal in size. Aortic atherosclerosis. Probable mitral annulus calcifications. There is no pulmonary edema. No focal airspace disease. No pleural fluid or pneumothorax. No acute osseous abnormalities are seen. IMPRESSION: Low lung volumes without acute abnormality. Electronically Signed   By: Keith Rake M.D.   On: 04/02/2021 00:24    EKG: I independently viewed the EKG done and my findings are as followed: None available at the time of this dictation.  Assessment/Plan Present on Admission:  Edema  Active Problems:   Edema  Peripheral edema, refractory to home p.o. Lasix. Per her daughter at bedside, she has been on Lasix for 4 months without significant improvement of her bilateral lower extremity edema  Resume home p.o. diuresis and closely follow renal function Replete electrolytes as indicated.  AKI, suspect intrinsic in the setting of ACE inhibitor Baseline creatinine 0.7 with GFR greater than 60 Presented with creatinine of 1.47 with GFR of 37 Hold off lisinopril and HCTZ. Avoid nephrotoxic agents, dehydration and hypotension. Monitor urine output Repeat chemistry panel in the morning.  Failure to thrive in an adult Per her daughter she has lost significant amount of weight since the death of  her husband in Nov 03, 2020. Dietitian consult Liberalize diet Encourage increase protein calorie intake  Bilateral lower extremity blisters with weeping Wound care specialist Local wound care  Adjustment disorder/chronic anxiety/depression Death of her husband in Nov 03, 2020, married for 60 years. Resume home regimen once med rec has been reconciled.  Ambulatory dysfunction post  fall CT head and cervical spine no acute fracture or subluxation PT OT assessment Fall precautions.   DVT prophylaxis: Subcu Lovenox daily  Code Status: Full code  Family Communication: Daughter at her bedside  Disposition Plan: Admit to telemetry medical.  Consults called: None  Admission status: Inpatient status.  Patient will require at least 2 midnights for further evaluation and treatment of present condition.   Status is: Inpatient    Dispo: The patient is from: Home.               Anticipated d/c is to: Home with home health services.                Patient currently, not stable for discharge at this time.     Difficult to place patient not applicable.         Kayleen Memos MD [facility] Pager [phone]  If 7PM-7AM, please contact night-coverage [email or web address] Password [facility]  04/02/2021, 5:24 AM

## 2021-04-02 NOTE — ED Notes (Signed)
PT at bedside.

## 2021-04-02 NOTE — ED Notes (Signed)
Patient returned from CT

## 2021-04-02 NOTE — ED Notes (Signed)
Patient transported to CT 

## 2021-04-02 NOTE — ED Notes (Signed)
Pt transported to CT ?

## 2021-04-02 NOTE — Progress Notes (Signed)
Subjective: Patient admitted this morning, see detailed H&P by Dr [place] Crane 80 year old female with history of essential hypertension, diabetes mellitus type 2, adjustment disorder recently lost her husband in 11/15/20 in the hospital came to the ED after a fall at home.  Patient has been taking Lasix at home despite that she has been getting increasing lower extremity edema.  Work-up in the ED showed edema and electrolyte disturbance with hypokalemia 2.9.  Also found to have AKI.  Vitals:   04/02/21 1122 04/02/21 1300  BP:  136/72  Pulse:  93  Resp:  (!) 22  Temp: (!) 97.4 F (36.3 C)   SpO2:  94%      A/P Peripheral edema -Refractory to Lasix at home -Albumin is 3.4 -P.o. Lasix has been resumed  Acute kidney injury -Baseline creatinine 0.7 -Presented with creatinine of 1.47 -HCTZ and lisinopril on hold  Failure to thrive -Patient has lost significant amount of weight since the death of her husband in Nov 15, 2020 -Dietitian consulted  Bilateral lower extremity blisters with weeping -Wound care consulted  S/p fall -CT head and CT cervical spine showed no acute fracture or subluxation -Fall precautions -PT/OT consult    [facility] Pager- [phone]

## 2021-04-02 NOTE — ED Notes (Signed)
Daughter Moshe Salisbury [phone] would like an update

## 2021-04-02 NOTE — ED Notes (Signed)
Attempted report 

## 2021-04-02 NOTE — ED Notes (Signed)
Lab to add Mag 

## 2021-04-02 NOTE — ED Notes (Signed)
Attempted IV x 2 unsuccessful.

## 2021-04-02 NOTE — ED Notes (Signed)
Light green and lavender redrawn and sent to lab.

## 2021-04-02 NOTE — ED Notes (Signed)
Help get patient cleaned up sheets changed patient is resting with call bell in reach  

## 2021-04-03 ENCOUNTER — Inpatient Hospital Stay (HOSPITAL_COMMUNITY): Payer: Medicare Other

## 2021-04-03 DIAGNOSIS — R609 Edema, unspecified: Secondary | ICD-10-CM | POA: Diagnosis not present

## 2021-04-03 DIAGNOSIS — K7460 Unspecified cirrhosis of liver: Secondary | ICD-10-CM | POA: Diagnosis not present

## 2021-04-03 DIAGNOSIS — R188 Other ascites: Secondary | ICD-10-CM | POA: Diagnosis not present

## 2021-04-03 DIAGNOSIS — Z9049 Acquired absence of other specified parts of digestive tract: Secondary | ICD-10-CM | POA: Diagnosis not present

## 2021-04-03 LAB — HEPATIC FUNCTION PANEL
ALT: 48 U/L — ABNORMAL HIGH (ref 0–44)
AST: 62 U/L — ABNORMAL HIGH (ref 15–41)
Albumin: 3.2 g/dL — ABNORMAL LOW (ref 3.5–5.0)
Alkaline Phosphatase: 321 U/L — ABNORMAL HIGH (ref 38–126)
Bilirubin, Direct: 0.7 mg/dL — ABNORMAL HIGH (ref 0.0–0.2)
Indirect Bilirubin: 1.4 mg/dL — ABNORMAL HIGH (ref 0.3–0.9)
Total Bilirubin: 2.1 mg/dL — ABNORMAL HIGH (ref 0.3–1.2)
Total Protein: 6.8 g/dL (ref 6.5–8.1)

## 2021-04-03 LAB — BRAIN NATRIURETIC PEPTIDE: B Natriuretic Peptide: 364.3 pg/mL — ABNORMAL HIGH (ref 0.0–100.0)

## 2021-04-03 LAB — URINALYSIS, ROUTINE W REFLEX MICROSCOPIC
Bilirubin Urine: NEGATIVE
Glucose, UA: NEGATIVE mg/dL
Hgb urine dipstick: NEGATIVE
Ketones, ur: NEGATIVE mg/dL
Leukocytes,Ua: NEGATIVE
Nitrite: NEGATIVE
Protein, ur: NEGATIVE mg/dL
Specific Gravity, Urine: 1.015 (ref 1.005–1.030)
pH: 5 (ref 5.0–8.0)

## 2021-04-03 LAB — GLUCOSE, CAPILLARY
Glucose-Capillary: 202 mg/dL — ABNORMAL HIGH (ref 70–99)
Glucose-Capillary: 157 mg/dL — ABNORMAL HIGH (ref 70–99)
Glucose-Capillary: 109 mg/dL — ABNORMAL HIGH (ref 70–99)

## 2021-04-03 LAB — PROTIME-INR
INR: 1 (ref 0.8–1.2)
Prothrombin Time: 13.6 seconds (ref 11.4–15.2)

## 2021-04-03 LAB — HEMOGLOBIN A1C
Hgb A1c MFr Bld: 5.8 % — ABNORMAL HIGH (ref 4.8–5.6)
Mean Plasma Glucose: 119.76 mg/dL

## 2021-04-03 MED ORDER — INSULIN ASPART 100 UNIT/ML IJ SOLN
0.0000 [IU] | Freq: Three times a day (TID) | INTRAMUSCULAR | Status: DC
  Administered 2021-04-03: 2 [IU] via SUBCUTANEOUS
  Administered 2021-04-03: 3 [IU] via SUBCUTANEOUS
  Administered 2021-04-04: 2 [IU] via SUBCUTANEOUS
  Administered 2021-04-04: 1 [IU] via SUBCUTANEOUS
  Administered 2021-04-04 – 2021-04-05 (×2): 2 [IU] via SUBCUTANEOUS
  Administered 2021-04-06: 5 [IU] via SUBCUTANEOUS

## 2021-04-03 MED ORDER — FUROSEMIDE 40 MG PO TABS
40.0000 mg | ORAL_TABLET | Freq: Two times a day (BID) | ORAL | Status: DC
Start: 2021-04-03 — End: 2021-04-03

## 2021-04-03 MED ORDER — FUROSEMIDE 20 MG PO TABS
20.0000 mg | ORAL_TABLET | Freq: Once | ORAL | Status: AC
  Administered 2021-04-03: 20 mg via ORAL
  Filled 2021-04-03: qty 1

## 2021-04-03 MED ORDER — FUROSEMIDE 40 MG PO TABS
40.0000 mg | ORAL_TABLET | Freq: Once | ORAL | Status: AC
  Administered 2021-04-03: 40 mg via ORAL
  Filled 2021-04-03: qty 1

## 2021-04-03 MED ORDER — CARVEDILOL 6.25 MG PO TABS
6.2500 mg | ORAL_TABLET | Freq: Two times a day (BID) | ORAL | Status: DC
  Administered 2021-04-03 – 2021-04-05 (×5): 6.25 mg via ORAL
  Filled 2021-04-03 (×7): qty 1

## 2021-04-03 MED ORDER — ENSURE ENLIVE PO LIQD
237.0000 mL | Freq: Two times a day (BID) | ORAL | Status: DC
  Administered 2021-04-03 – 2021-04-04 (×3): 237 mL via ORAL

## 2021-04-03 NOTE — Progress Notes (Signed)
Physical Therapy Treatment Patient Details Name: Tracy Hammond MRN: [id number] DOB: [birth year] Today's Date: 04/03/2021    History of Present Illness 80 yo female admitted 04/08/2021 with onset of LE edema, falls, and wgt loss, failure to thrive after husband's death in 2020-10-30. PMHx includes:  OA, HTN, DM, adjustment disorder, glaucoma, GERD, colonic adenoma, thyroid disease, cataracts, cough, bursitis, skin CA, Lumbar laminectomy/decompression microdiscectomy (08/12/2020)    PT Comments    Pt admitted with above diagnosis. Pt was able to incr distance with RW with min assist due to poor vision.  No LOB with RW but does need cues at times due to vision. Daughter reports that her sister is coming to stay with pt with 24 hour care.  Will follow acutely.  Pt currently with functional limitations due to balance and endurance deficits. Pt will benefit from skilled PT to increase their independence and safety with mobility to allow discharge to the venue listed below.      Follow Up Recommendations  Home health PT;Supervision/Assistance - 24 hour (daughter coming to stay with pt)     Equipment Recommendations  None recommended by PT    Recommendations for Other Services       Precautions / Restrictions Precautions Precautions: Fall Precaution Comments: monitor BP Restrictions Weight Bearing Restrictions: No Other Position/Activity Restrictions: B LE skin wounds with fragile blisters    Mobility  Bed Mobility               General bed mobility comments: Pt in chair    Transfers Overall transfer level: Needs assistance Equipment used: Rolling walker (2 wheeled) Transfers: Sit to/from [facility] Sit to Stand: Min assist Stand pivot transfers: Min assist       General transfer comment: Min assist with cues for hand placement  Ambulation/Gait Ambulation/Gait assistance: Min assist;Min guard Gait Distance (Feet): 150 Feet Assistive device: Rolling walker (2  wheeled) Gait Pattern/deviations: Step-through pattern;Decreased stride length   Gait velocity interpretation: <1.31 ft/sec, indicative of household ambulator General Gait Details: Pt incr distance.  Occasionally runs into objects on the right as pt with poor vision. Pt safe with RW but needing assist due to visual impairment.   Stairs             Wheelchair Mobility    Modified Rankin (Stroke Patients Only)       Balance Overall balance assessment: Needs assistance Sitting-balance support: No upper extremity supported Sitting balance-Leahy Scale: Good     Standing balance support: Bilateral upper extremity supported;During functional activity Standing balance-Leahy Scale: Poor Standing balance comment: benefits from UE support in both dymanic and static standing position for balance                            Cognition Arousal/Alertness: Awake/alert Behavior During Therapy: WFL for tasks assessed/performed Overall Cognitive Status: Within Functional Limits for tasks assessed                                 General Comments: Pt's daughter is very helpful and often answers for her, but when asked she is able to answer orientation and basic questions [facility]      Exercises      General Comments General comments (skin integrity, edema, etc.): Daughter from [place] is coming to stay with pt 24 hours care per daughter in room      Pertinent Vitals/Pain  Pain Assessment: No/denies pain    Home Living Family/patient expects to be discharged to:: Private residence Living Arrangements: Alone Available Help at Discharge: Family Type of Home: House Home Access: Stairs to enter Entrance Stairs-Rails: Can reach both Home Layout: One level Home Equipment: Environmental consultant - 2 wheels Additional Comments: daughter is involved with her care    Prior Function Level of Independence: Independent with assistive device(s)  Gait / Transfers Assistance Needed: uses  RW PRN ADL's / Homemaking Assistance Needed: only sponge bathes, has been struggling with cooking. Mentally still sharp and manages bills etc Comments: doesnt drive due to macular degeneration   PT Goals (current goals can now be found in the care plan section) Acute Rehab PT Goals Patient Stated Goal: get back home Progress towards PT goals: Progressing toward goals    Frequency    Min 3X/week      PT Plan Discharge plan needs to be updated    Co-evaluation              AM-PAC PT "6 Clicks" Mobility   Outcome Measure  Help needed turning from your back to your side while in a flat bed without using bedrails?: A Little Help needed moving from lying on your back to sitting on the side of a flat bed without using bedrails?: A Little Help needed moving to and from a bed to a chair (including a wheelchair)?: A Little Help needed standing up from a chair using your arms (e.g., wheelchair or bedside chair)?: A Little Help needed to walk in hospital room?: A Little Help needed climbing 3-5 steps with a railing? : A Little 6 Click Score: 18    End of Session Equipment Utilized During Treatment: Gait belt Activity Tolerance: Patient limited by fatigue Patient left: with call bell/phone within reach;Other (comment);with family/visitor present (on toilet with OT taking over) Nurse Communication: Mobility status PT Visit Diagnosis: Unsteadiness on feet (R26.81);Muscle weakness (generalized) (M62.81);Difficulty in walking, not elsewhere classified (R26.2);Other (comment) (Hypertension)     Time: [id number] PT Time Calculation (min) (ACUTE ONLY): 16 min  Charges:  $Gait Training: 8-22 mins                     Norva Bowe M,PT Acute Rehab Services [phone] (pager)    Alvira Philips 04/03/2021, 12:14 PM

## 2021-04-03 NOTE — Plan of Care (Signed)

## 2021-04-03 NOTE — Care Management Obs Status (Signed)
[facility] NOTIFICATION   Patient Details  Name: Tracy Hammond MRN: [id number] Date of Birth: [birth year]   Medicare Observation Status Notification Given:  Yes    Laurena Slimmer, RN 04/03/2021, 5:27 PM

## 2021-04-03 NOTE — Care Management CC44 (Signed)
Condition Code 44 Documentation Completed  Patient Details  Name: AYAA CZARNIK MRN: [id number] Date of Birth: 1941/08/06   Condition Code 44 given:    Patient signature on Condition Code 44 notice:    Documentation of 2 MD's agreement:    Code 44 added to claim:       Laurena Slimmer, RN 04/03/2021, 5:26 PM

## 2021-04-03 NOTE — TOC Initial Note (Signed)
Transition of Care [facility]) - Initial/Assessment Note    Patient Details  Name: Tracy Hammond MRN: [id number] Date of Birth: June 22, 1941  [facility]) CM/SW Contact:    Joanne Chars, LCSW Phone Number: 04/03/2021, 11:17 AM  Clinical Narrative:    CSW met with pt and daughter [name] to discuss DC recommendation for SNF.  Permission given to speak with daughter [name] and also with daughter Baxter Flattery who lives in [place] but is coming to [place] currently.  Pt does not want to go to SNF and daughter supports this decision.  Pt has a grandson who lives in the home with her, but daughter Baxter Flattery will be staying with pt at discharge and [name] will also be able to stay with pt if needed.  THey will have 24/7 support for pt in home.  Pt chooses HH, choice document given to daughter.  Current DME in home: walker.  PCP in place.  Pt has been vaccinated for covid with both boosters.  Daughter also reports they do not want [facility] to be involved for DME and prefer [facility].                Expected Discharge Plan: [place] Barriers to Discharge: Continued Medical Work up   Patient Goals and CMS Choice Patient states their goals for this hospitalization and ongoing recovery are:: "get better, be independent" CMS Medicare.gov Compare Post Acute Care list provided to:: Patient Represenative (must comment) Choice offered to / list presented to : Adult Children  Expected Discharge Plan and Services Expected Discharge Plan: [place] Choice: [place] arrangements for the past 2 months: Single Family Home                                      Prior Living Arrangements/Services Living arrangements for the past 2 months: Single Family Home Lives with:: Relatives (grandson) Patient language and need for interpreter reviewed:: Yes Do you feel safe going back to the place where you live?: Yes      Need for Family  Participation in Patient Care: Yes (Comment) Care giver support system in place?: Yes (comment) Current home services: Other (comment) (none) Criminal Activity/Legal Involvement Pertinent to Current Situation/Hospitalization: No - Comment as needed  Activities of Daily Living      Permission Sought/Granted Permission sought to share information with : Family Supports Permission granted to share information with : Yes, Verbal Permission Granted  Share Information with NAME: daughters [name] and Baxter Flattery  Permission granted to share info w AGENCY: HH        Emotional Assessment Appearance:: Appears older than stated age Attitude/Demeanor/Rapport: Engaged Affect (typically observed): Appropriate Orientation: :  (not recorded) Alcohol / Substance Use: Not Applicable Psych Involvement: No (comment)  Admission diagnosis:  Edema [R60.9] Hypokalemia [E87.6] Peripheral edema [R60.9] Acute kidney injury (nontraumatic) ([place]) [N17.9] Patient Active Problem List   Diagnosis Date Noted   Edema 04/02/2021   Episode of change in speech 02/28/2021   Diabetic foot infection ([place]) 02/06/2021   Adjustment disorder 12/20/2020   Scalp lesion 12/20/2020   Spinal stenosis of lumbar region 03/19/2020   Venous insufficiency 03/30/2018   Unilateral primary osteoarthritis, right hip 12/29/2017   Smokers' cough ([place]) 11/13/2016   Routine general medical examination at a health care facility 09/05/2015   Diarrhea 03/15/2015   Restless  leg syndrome 08/28/2014   VARICOSE VEINS, LOWER EXTREMITIES 05/14/2008   Hyperlipidemia associated with type 2 diabetes mellitus ([place]) 04/14/2007   Diabetes mellitus type 2 with complications ([place]) 91/63/8466   Essential hypertension 03/29/2007   PCP:  Hoyt Koch, MD Pharmacy:   [place], [place] - [address] [place] [address] Phone: [phone] Fax: [phone]     Social Determinants of Health  (SDOH) Interventions    Readmission Risk Interventions No flowsheet data found.

## 2021-04-03 NOTE — Progress Notes (Signed)
Initial Nutrition Assessment  DOCUMENTATION CODES:  Not applicable  INTERVENTION:  Recommend daily weights  -Ensure Enlive po BID, each supplement provides 350 kcal and 20 grams of protein -MVI with minerals daily  NUTRITION DIAGNOSIS:  Inadequate oral intake related to social / environmental circumstances (passing of her husband in 11-09-2020) as evidenced by per patient/family report, energy intake < 75% for > or equal to 3 months, meal completion < 50%  GOAL:  Patient will meet greater than or equal to 90% of their needs  MONITOR:  PO intake, Supplement acceptance, Weight trends, Labs, I & O's  REASON FOR ASSESSMENT:  Consult Assessment of nutrition requirement/status  ASSESSMENT:  Pt with PMH significant for HTN, type 2 DM, and adjustment disorder who presented from home s/p fall and admitted with AKI w/ edema and electrolyte disturbances.  Pt not in room at time of RD visit. Pt in ultrasound. Discussed pt with RN. History obtained from chart as no family in room. Pt's daughter feels as though fall was 2/2 severe BLE. Daughter reports pt has had ongoing edema despite being on Lasix for 4 months. Pt noted to have 3+ pitting edema in BLE along with diffuse blistering.   Pt's daughter also reports pt has had poor appetite/po intake with associated weight loss ever since the death of her husband in November 09, 2020.   RD unable to verify reported weight loss with available weight readings (see below), though suspect this is largely in part to edema masking true losses. Monitor changes to weight as edema improves. Recommend daily weights.   PO intake: 25% x 1 recorded meal  Medications: lasix, remeron, SSI, mvi with minerals, klor-con Labs: Cr 1.22 (H)   UOP: 130m + 2x unmeasured occurrences x24 hours I/O: +3352msince admit  NUTRITION - FOCUSED PHYSICAL EXAM: Unable to perform at this time. Will attempt at follow-up.   Diet Order:   Diet Order             Diet  regular Room service appropriate? Yes; Fluid consistency: Thin  Diet effective now                  EDUCATION NEEDS:  No education needs have been identified at this time  Skin:  Skin Assessment: Reviewed RN Assessment (blistering BLE)  Last BM:  PTA  Height:  Ht Readings from Last 1 Encounters:  04/16/2021 '5\' 1"'$  (1.549 m)   Weight:  Wt Readings from Last 10 Encounters:  03/31/2021 58.1 kg  02/27/21 57.6 kg  02/04/21 58.4 kg  01/13/21 58.3 kg  12/19/20 57.1 kg  09/26/20 58.3 kg  09/24/20 58.1 kg  08/27/20 60.3 kg  08/20/20 60.3 kg  08/12/20 60.3 kg   BMI:  Body mass index is 24.19 kg/m.  Estimated Nutritional Needs:  Kcal:  1450-1650 Protein:  70-85 grams Fluid:  >1.45L   AmLarkin InaMS, RD, LDN (she/her/hers) RD pager number and weekend/on-call pager number located in [facility]

## 2021-04-03 NOTE — Progress Notes (Signed)
PROGRESS NOTE                                                                                                                                                                                                             Patient Demographics:    Tracy Hammond, is a 80 y.o. female, DOB - [birth year], [id number]  Outpatient Primary MD for the patient is Hoyt Koch, MD    LOS - 1  Admit date - 04/22/2021    CC - Edema     Brief Narrative (HPI from H&P) -  Tracy Hammond is a 80 y.o. female with medical history significant for essential hypertension, type 2 diabetes, adjustment disorder, recently lost her husband in February 22 in the hospital who presented from home after a fall, her daughter thinks she fell because of the severe bilateral lower extremity edema, work-up in the ER showed AKI with massive edema and she was admitted for further treatment   Subjective:    Tracy Hammond today has, No headache, No chest pain, No abdominal pain - No Nausea, No new weakness tingling or numbness, no SOB.   Assessment  & Plan :     Severe peripheral edema with AKI - she also has mild ascites, reason is not entirely clear, no previous history of heart liver or renal problems, will check UA, LFTs including albumin level and INR, echocardiogram, abdominal ultrasound to evaluate for ascites along with liver and pancreas.  Diuresis with Lasix and monitor.  AKI is improving, continue compressive stockings and leg elevation along with fluid restriction.  2.  Essential hypertension.  Blood pressure is high placed on Coreg and monitor  3.  GERD.  Will place on PPI.  4. Deconditioning.  PT OT and monitor.  5. DM Type II.  Sliding scale.  Lab Results  Component Value Date   HGBA1C 6.4 07/19/2020   CBG (last 3)  No results for input(s): GLUCAP in the last 72 hours.        Condition - Extremely Guarded  Family Communication  :   daughter in rm 04/03/21  Code Status :  Full  Consults  :  None  PUD Prophylaxis : PPI   Procedures  :     CT Head and C Spine  - non acute.  TTE -   ABD [place] -  Disposition Plan  :    Status is: Inpatient  Remains inpatient appropriate because:IV treatments appropriate due to intensity of illness or inability to take PO  Dispo: The patient is from: Home              Anticipated d/c is to: Home              Patient currently is not medically stable to d/c.   Difficult to place patient No  DVT Prophylaxis  :    enoxaparin (LOVENOX) injection 30 mg Start: 04/02/21 1000 SCDs Start: 04/02/21 0549   Lab Results  Component Value Date   PLT 204 04/02/2021    Diet :  Diet Order             Diet regular Room service appropriate? Yes; Fluid consistency: Thin  Diet effective now                    Inpatient Medications  Scheduled Meds:  aspirin EC  81 mg Oral q morning   brimonidine  1 drop Both Eyes Q8H   carvedilol  6.25 mg Oral BID WC   dorzolamide-timolol  1 drop Both Eyes BID   enoxaparin (LOVENOX) injection  30 mg Subcutaneous Q24H   furosemide  40 mg Oral Once   insulin aspart  0-9 Units Subcutaneous TID WC   mirtazapine  15 mg Oral QHS   multivitamin with minerals  1 tablet Oral q morning   potassium chloride SA  20 mEq Oral Daily   Continuous Infusions: PRN Meds:.  Antibiotics  :    Anti-infectives (From admission, onward)    None        Time Spent in minutes  30   Lala Lund M.D on 04/03/2021 at 10:39 AM  To page go to [email or web address]   [facility] -  Office  [phone]  See all Orders from today for further details    Objective:   Vitals:   04/02/21 1715 04/02/21 1719 04/02/21 1846 04/02/21 1914  BP: 138/75  (!) 144/68 (!) 157/103  Pulse: 90 97 90 98  Resp: (!) '22 16 16 16  '$ Temp:    98 F (36.7 C)  TempSrc:    Oral  SpO2: 94% 96% 96% 93%  Weight:      Height:        Wt Readings from Last 3 Encounters:   04/27/2021 58.1 kg  02/27/21 57.6 kg  02/04/21 58.4 kg     Intake/Output Summary (Last 24 hours) at 04/03/2021 1039 Last data filed at 04/03/2021 1000 Gross per 24 hour  Intake 236 ml  Output 100 ml  Net 136 ml     Physical Exam  Awake Alert, No new F.N deficits, Normal affect [place].AT,PERRAL Supple Neck,No JVD, No cervical lymphadenopathy appriciated.  Symmetrical Chest wall movement, Good air movement bilaterally, CTAB RRR,No Gallops,Rubs or new Murmurs, No Parasternal Heave +ve B.Sounds, Abd Soft, No tenderness, Mild ascites No Cyanosis, 3 + leg edema     Data Review:    CBC Recent Labs  Lab 04/18/2021 2357 04/02/21 1035  WBC 10.9* 15.7*  HGB 15.0 16.2*  HCT 44.6 49.1*  PLT 150 204  MCV 92.0 91.9  MCH 30.9 30.3  MCHC 33.6 33.0  RDW 15.2 15.4  LYMPHSABS 1.4  --   MONOABS 0.5  --   EOSABS 0.0  --   BASOSABS 0.0  --     Recent Labs  Lab 04/05/2021 2357 04/02/21 0059 04/02/21 1035  04/03/21 0708  NA 139  --  138  --   K 2.9*  --  4.3  --   CL 98  --  98  --   CO2 29  --  28  --   GLUCOSE 107*  --  73  --   BUN 39*  --  36*  --   CREATININE 1.47*  --  1.22*  --   CALCIUM 8.6*  --  8.6*  --   MG  --  1.7  --   --   BNP 191.1*  --   --  364.3*    ------------------------------------------------------------------------------------------------------------------ No results for input(s): CHOL, HDL, LDLCALC, TRIG, CHOLHDL, LDLDIRECT in the last 72 hours.  Lab Results  Component Value Date   HGBA1C 6.4 07/19/2020   ------------------------------------------------------------------------------------------------------------------ No results for input(s): TSH, T4TOTAL, T3FREE, THYROIDAB in the last 72 hours.  Invalid input(s): FREET3  Cardiac Enzymes No results for input(s): CKMB, TROPONINI, MYOGLOBIN in the last 168 hours.  Invalid input(s): CK ------------------------------------------------------------------------------------------------------------------     Component Value Date/Time   BNP 364.3 (H) 04/03/2021 0708     Radiology Reports CT HEAD WO CONTRAST (5MM)  Result Date: 04/02/2021 CLINICAL DATA:  Fall EXAM: CT HEAD WITHOUT CONTRAST CT CERVICAL SPINE WITHOUT CONTRAST TECHNIQUE: Multidetector CT imaging of the head and cervical spine was performed following the standard protocol without intravenous contrast. Multiplanar CT image reconstructions of the cervical spine were also generated. COMPARISON:  None. FINDINGS: CT HEAD FINDINGS Brain: There is no mass, hemorrhage or extra-axial collection. There is generalized atrophy without lobar predilection. There is hypoattenuation of the periventricular white matter, most commonly indicating chronic ischemic microangiopathy. Vascular: No abnormal hyperdensity of the major intracranial arteries or dural venous sinuses. No intracranial atherosclerosis. Skull: The visualized skull base, calvarium and extracranial soft tissues are normal. Sinuses/Orbits: No fluid levels or advanced mucosal thickening of the visualized paranasal sinuses. Left mastoid opacification. The orbits are normal. CT CERVICAL SPINE FINDINGS Alignment: No static subluxation. Facets are aligned. Occipital condyles are normally positioned. Skull base and vertebrae: No acute fracture. Soft tissues and spinal canal: No prevertebral fluid or swelling. No visible canal hematoma. Disc levels: No advanced spinal canal or neural foraminal stenosis. Upper chest: No pneumothorax, pulmonary nodule or pleural effusion. Other: Normal visualized paraspinal cervical soft tissues. IMPRESSION: 1. Chronic ischemic microangiopathy and generalized atrophy without acute intracranial abnormality. 2. No acute fracture or static subluxation of the cervical spine. Electronically Signed   By: Ulyses Jarred M.D.   On: 04/02/2021 01:40   CT Cervical Spine Wo Contrast  Result Date: 04/02/2021 CLINICAL DATA:  Fall EXAM: CT HEAD WITHOUT CONTRAST CT CERVICAL SPINE WITHOUT  CONTRAST TECHNIQUE: Multidetector CT imaging of the head and cervical spine was performed following the standard protocol without intravenous contrast. Multiplanar CT image reconstructions of the cervical spine were also generated. COMPARISON:  None. FINDINGS: CT HEAD FINDINGS Brain: There is no mass, hemorrhage or extra-axial collection. There is generalized atrophy without lobar predilection. There is hypoattenuation of the periventricular white matter, most commonly indicating chronic ischemic microangiopathy. Vascular: No abnormal hyperdensity of the major intracranial arteries or dural venous sinuses. No intracranial atherosclerosis. Skull: The visualized skull base, calvarium and extracranial soft tissues are normal. Sinuses/Orbits: No fluid levels or advanced mucosal thickening of the visualized paranasal sinuses. Left mastoid opacification. The orbits are normal. CT CERVICAL SPINE FINDINGS Alignment: No static subluxation. Facets are aligned. Occipital condyles are normally positioned. Skull base and vertebrae: No acute fracture. Soft tissues and  spinal canal: No prevertebral fluid or swelling. No visible canal hematoma. Disc levels: No advanced spinal canal or neural foraminal stenosis. Upper chest: No pneumothorax, pulmonary nodule or pleural effusion. Other: Normal visualized paraspinal cervical soft tissues. IMPRESSION: 1. Chronic ischemic microangiopathy and generalized atrophy without acute intracranial abnormality. 2. No acute fracture or static subluxation of the cervical spine. Electronically Signed   By: Ulyses Jarred M.D.   On: 04/02/2021 01:40   DG Chest Port 1 View  Result Date: 04/02/2021 CLINICAL DATA:  Edema. EXAM: PORTABLE CHEST 1 VIEW COMPARISON:  09/28/2018 FINDINGS: Low lung volumes. The heart is normal in size. Aortic atherosclerosis. Probable mitral annulus calcifications. There is no pulmonary edema. No focal airspace disease. No pleural fluid or pneumothorax. No acute osseous  abnormalities are seen. IMPRESSION: Low lung volumes without acute abnormality. Electronically Signed   By: Keith Rake M.D.   On: 04/02/2021 00:24

## 2021-04-03 NOTE — Evaluation (Signed)
Occupational Therapy Evaluation Patient Details Name: Tracy Hammond MRN: [id number] DOB: 1941/06/14 Today's Date: 04/03/2021    History of Present Illness 80 yo female admitted 04/21/2021 with onset of LE edema, falls, and wgt loss, failure to thrive after husband's death in 2020/10/16. PMHx includes:  OA, HTN, DM, adjustment disorder, glaucoma, GERD, colonic adenoma, thyroid disease, cataracts, cough, bursitis, skin CA, Lumbar laminectomy/decompression microdiscectomy (08/12/2020)   Clinical Impression   Pt is typically at home, mod I for ADL but with falls recently, unable to access tub (it is a big garden tub) so she has been sponge bathing, coping with macular degeneration. TOday she is mod A for transfers from toilet (assist for boost into standing) with vc for safe use of RW with transfers and in room mobility. She was able to stand at sink and perform standing grooming tasks without assist (min guard) Max A for LB ADL at this time due to large bandages and decreased ROM/access. Pt and daughter provided "Services for the Blind" pamphlet/brochure as this is a big area of frustration. Pt also committed to going to her daughters house 1x a week for full shower. Pt will benefit from skilled OT in the acute setting as well as afterwards at the [facility] level to maximize safety and independence in ADL and functional transfers with focus on DME safe use, decreased vision compensatory strategies, fall risk prevention, and participation in IADL.    Follow Up Recommendations  Home health OT;Supervision/Assistance - 24 hour    Equipment Recommendations  None recommended by OT    Recommendations for Other Services       Precautions / Restrictions Precautions Precautions: Fall Precaution Comments: monitor BP, running high Restrictions Weight Bearing Restrictions: No Other Position/Activity Restrictions: B LE skin wounds with fragile blisters      Mobility Bed Mobility               General bed  mobility comments: met is hallway with PT and then in recliner at [facility]    Transfers Overall transfer level: Needs assistance Equipment used: Rolling walker (2 wheeled) Transfers: Sit to/from [facility] Sit to Stand: Mod assist Stand pivot transfers: Min assist       General transfer comment: mod A to power up from commode with vc for safe hand placement, once up on feet she is min guard    Balance Overall balance assessment: Needs assistance Sitting-balance support: No upper extremity supported Sitting balance-Leahy Scale: Good     Standing balance support: Bilateral upper extremity supported;During functional activity Standing balance-Leahy Scale: Poor Standing balance comment: benefits from UE support in both dymanic and static standing position for balance                           ADL either performed or assessed with clinical judgement   ADL Overall ADL's : Needs assistance/impaired Eating/Feeding: Set up;With caregiver independent assisting;Sitting Eating/Feeding Details (indicate cue type and reason): assist to cut up food, identify utensils - assit mainly due to decreased vision Grooming: Wash/dry hands;Wash/dry face;Oral care;Min guard;Standing Grooming Details (indicate cue type and reason): sink level, no cues needed Upper Body Bathing: Min guard;Sitting   Lower Body Bathing: Moderate assistance;Sitting/lateral leans   Upper Body Dressing : Minimal assistance;Sitting Upper Body Dressing Details (indicate cue type and reason): extra time required Lower Body Dressing: Maximal assistance;Sit to/from stand Lower Body Dressing Details (indicate cue type and reason): Pt can reach down to feet, currently  requiring extra assist due to bandages Toilet Transfer: Min guard;Ambulation;RW;Cueing for safety Toilet Transfer Details (indicate cue type and reason): vc for safe hand placement and use of RW Toileting- Clothing Manipulation and Hygiene: Min  guard;Sitting/lateral lean     Tub/Shower Transfer Details (indicate cue type and reason): sponge bathes at home, daughter has a walk in shower with seat that Pt has committed to going over once a week on Thursdays to get a good shower before her hair appt on Fridays Functional mobility during ADLs: Min guard;Rolling walker;Cueing for safety General ADL Comments: generalized weakness, R ear hears better than Left, macular degeneration     Vision Baseline Vision/History: Macular Degeneration Patient Visual Report: No change from baseline       Perception     Praxis      Pertinent Vitals/Pain Pain Assessment: No/denies pain     Hand Dominance Right   Extremity/Trunk Assessment Upper Extremity Assessment Upper Extremity Assessment: Generalized weakness   Lower Extremity Assessment Lower Extremity Assessment: Defer to PT evaluation   Cervical / Trunk Assessment Cervical / Trunk Assessment: Kyphotic   Communication Communication Communication: No difficulties   Cognition Arousal/Alertness: Awake/alert Behavior During Therapy: WFL for tasks assessed/performed Overall Cognitive Status: Within Functional Limits for tasks assessed                                 General Comments: Pt's daughter is very helpful and often answers for her, but when asked she is able to answer orientation and basic questions WFL   General Comments  Oldest Daughter [name] present throughout session, provided "services for the blind" pamphlet as well as educated on [employer] for [employer] with decreased vision - suggested use of headphones due to decreased hearing    Exercises     Shoulder Instructions      Home Living Family/patient expects to be discharged to:: Private residence Living Arrangements: Alone Available Help at Discharge: Family Type of Home: House Home Access: Stairs to enter Technical brewer of Steps: 4 Entrance Stairs-Rails: Can reach both Home Layout:  One level     Bathroom Shower/Tub: Teacher, early years/pre: Handicapped height     Home Equipment: Environmental consultant - 2 wheels   Additional Comments: daughter is involved with her care      Prior Functioning/Environment Level of Independence: Independent with assistive device(s)  Gait / Transfers Assistance Needed: uses RW PRN ADL's / Homemaking Assistance Needed: only sponge bathes, has been struggling with cooking. Mentally still sharp and manages bills etc   Comments: doesnt drive due to macular degeneration        OT Problem List: Decreased strength;Decreased activity tolerance;Impaired balance (sitting and/or standing);Impaired vision/perception;Decreased safety awareness      OT Treatment/Interventions: Self-care/ADL training;DME and/or AE instruction;Therapeutic activities;Visual/perceptual remediation/compensation;Patient/family education;Balance training    OT Goals(Current goals can be found in the care plan section) Acute Rehab OT Goals Patient Stated Goal: get back home OT Goal Formulation: With patient/family Time For Goal Achievement: 04/17/21 Potential to Achieve Goals: Good  OT Frequency: Min 2X/week   Barriers to D/C:            Co-evaluation              AM-PAC OT "6 Clicks" Daily Activity     Outcome Measure Help from another person eating meals?: A Lot Help from another person taking care of personal grooming?: A Little Help from another  person toileting, which includes using toliet, bedpan, or urinal?: A Little Help from another person bathing (including washing, rinsing, drying)?: A Lot Help from another person to put on and taking off regular upper body clothing?: A Little Help from another person to put on and taking off regular lower body clothing?: A Lot 6 Click Score: 15   End of Session Equipment Utilized During Treatment: Gait belt;Rolling walker Nurse Communication: Mobility status;Other (comment) (urine sample collection in  bathroom)  Activity Tolerance: Patient tolerated treatment well Patient left: in chair;with call bell/phone within reach;with chair alarm set;with family/visitor present  OT Visit Diagnosis: Unsteadiness on feet (R26.81);History of falling (Z91.81);Muscle weakness (generalized) (M62.81);Adult, failure to thrive (R62.7)                Time: 1761-6073 OT Time Calculation (min): 24 min Charges:  OT General Charges $OT Visit: 1 Visit OT Evaluation $OT Eval Moderate Complexity: 1 Mod OT Treatments $Self Care/Home Management : 8-22 mins  Jesse Sans OTR/L Acute Rehabilitation Services Pager: [phone] Office: [place] 04/03/2021, 10:55 AM

## 2021-04-03 NOTE — Plan of Care (Signed)

## 2021-04-04 ENCOUNTER — Observation Stay (HOSPITAL_COMMUNITY): Payer: Medicare Other

## 2021-04-04 DIAGNOSIS — R609 Edema, unspecified: Secondary | ICD-10-CM | POA: Diagnosis not present

## 2021-04-04 DIAGNOSIS — R188 Other ascites: Secondary | ICD-10-CM | POA: Diagnosis not present

## 2021-04-04 DIAGNOSIS — R600 Localized edema: Secondary | ICD-10-CM

## 2021-04-04 LAB — CBC WITH DIFFERENTIAL/PLATELET
Abs Immature Granulocytes: 0.05 10*3/uL (ref 0.00–0.07)
Basophils Absolute: 0 10*3/uL (ref 0.0–0.1)
Basophils Relative: 0 %
Eosinophils Absolute: 0 10*3/uL (ref 0.0–0.5)
Eosinophils Relative: 0 %
HCT: 37.9 % (ref 36.0–46.0)
Hemoglobin: 12.5 g/dL (ref 12.0–15.0)
Immature Granulocytes: 1 %
Lymphocytes Relative: 10 %
Lymphs Abs: 1.1 10*3/uL (ref 0.7–4.0)
MCH: 29.9 pg (ref 26.0–34.0)
MCHC: 33 g/dL (ref 30.0–36.0)
MCV: 90.7 fL (ref 80.0–100.0)
Monocytes Absolute: 0.6 10*3/uL (ref 0.1–1.0)
Monocytes Relative: 6 %
Neutro Abs: 8.5 10*3/uL — ABNORMAL HIGH (ref 1.7–7.7)
Neutrophils Relative %: 83 %
Platelets: 181 10*3/uL (ref 150–400)
RBC: 4.18 MIL/uL (ref 3.87–5.11)
RDW: 15.3 % (ref 11.5–15.5)
WBC: 10.2 10*3/uL (ref 4.0–10.5)
nRBC: 0 % (ref 0.0–0.2)

## 2021-04-04 LAB — ECHOCARDIOGRAM COMPLETE
Area-P 1/2: 1.76 cm2
Height: 61 in
S' Lateral: 1.7 cm
Weight: 2048 oz

## 2021-04-04 LAB — COMPREHENSIVE METABOLIC PANEL
ALT: 37 U/L (ref 0–44)
AST: 52 U/L — ABNORMAL HIGH (ref 15–41)
Albumin: 2.4 g/dL — ABNORMAL LOW (ref 3.5–5.0)
Alkaline Phosphatase: 276 U/L — ABNORMAL HIGH (ref 38–126)
Anion gap: 11 (ref 5–15)
BUN: 29 mg/dL — ABNORMAL HIGH (ref 8–23)
CO2: 27 mmol/L (ref 22–32)
Calcium: 8.4 mg/dL — ABNORMAL LOW (ref 8.9–10.3)
Chloride: 102 mmol/L (ref 98–111)
Creatinine, Ser: 0.87 mg/dL (ref 0.44–1.00)
GFR, Estimated: >60 mL/min (ref >60)
Glucose, Bld: 150 mg/dL — ABNORMAL HIGH (ref 70–99)
Potassium: 2.8 mmol/L — ABNORMAL LOW (ref 3.5–5.1)
Sodium: 140 mmol/L (ref 135–145)
Total Bilirubin: 1.5 mg/dL — ABNORMAL HIGH (ref 0.3–1.2)
Total Protein: 4.9 g/dL — ABNORMAL LOW (ref 6.5–8.1)

## 2021-04-04 LAB — HEPATITIS PANEL, ACUTE
HCV Ab: NONREACTIVE
Hep A IgM: NONREACTIVE
Hep B C IgM: NONREACTIVE
Hepatitis B Surface Ag: NONREACTIVE

## 2021-04-04 LAB — GLUCOSE, CAPILLARY
Glucose-Capillary: 133 mg/dL — ABNORMAL HIGH (ref 70–99)
Glucose-Capillary: 199 mg/dL — ABNORMAL HIGH (ref 70–99)
Glucose-Capillary: 182 mg/dL — ABNORMAL HIGH (ref 70–99)
Glucose-Capillary: 156 mg/dL — ABNORMAL HIGH (ref 70–99)

## 2021-04-04 LAB — MAGNESIUM: Magnesium: 1.6 mg/dL — ABNORMAL LOW (ref 1.7–2.4)

## 2021-04-04 LAB — BRAIN NATRIURETIC PEPTIDE: B Natriuretic Peptide: 430.2 pg/mL — ABNORMAL HIGH (ref 0.0–100.0)

## 2021-04-04 LAB — POTASSIUM: Potassium: 3.3 mmol/L — ABNORMAL LOW (ref 3.5–5.1)

## 2021-04-04 LAB — THYROID STIMULATING IMMUNOGLOBULIN: Thyroid Stimulating Immunoglob: <0.1 IU/L (ref 0.00–0.55)

## 2021-04-04 MED ORDER — ALBUTEROL SULFATE (2.5 MG/3ML) 0.083% IN NEBU: 2.5000 mg | INHALATION_SOLUTION | Freq: Four times a day (QID) | RESPIRATORY_TRACT | Status: DC | PRN

## 2021-04-04 MED ORDER — MAGNESIUM SULFATE 50 % IJ SOLN
3.0000 g | Freq: Once | INTRAVENOUS | Status: AC
  Administered 2021-04-04: 3 g via INTRAVENOUS
  Filled 2021-04-04: qty 6

## 2021-04-04 MED ORDER — POTASSIUM CHLORIDE CRYS ER 20 MEQ PO TBCR
20.0000 meq | EXTENDED_RELEASE_TABLET | Freq: Every day | ORAL | Status: DC
  Administered 2021-04-04: 20 meq via ORAL

## 2021-04-04 MED ORDER — SPIRONOLACTONE 25 MG PO TABS
25.0000 mg | ORAL_TABLET | Freq: Every day | ORAL | Status: DC
  Administered 2021-04-04 – 2021-04-05 (×2): 25 mg via ORAL
  Filled 2021-04-04 (×2): qty 1

## 2021-04-04 MED ORDER — PROSOURCE PLUS PO LIQD
30.0000 mL | Freq: Two times a day (BID) | ORAL | Status: DC
  Administered 2021-04-04 – 2021-04-05 (×2): 30 mL via ORAL
  Filled 2021-04-04 (×3): qty 30

## 2021-04-04 MED ORDER — BOOST PLUS PO LIQD
237.0000 mL | Freq: Three times a day (TID) | ORAL | Status: DC
  Administered 2021-04-04: 237 mL via ORAL
  Filled 2021-04-04 (×7): qty 237

## 2021-04-04 MED ORDER — POTASSIUM CHLORIDE CRYS ER 20 MEQ PO TBCR
40.0000 meq | EXTENDED_RELEASE_TABLET | Freq: Two times a day (BID) | ORAL | Status: AC
  Administered 2021-04-04 (×2): 40 meq via ORAL
  Filled 2021-04-04 (×3): qty 2

## 2021-04-04 MED ORDER — FUROSEMIDE 40 MG PO TABS
40.0000 mg | ORAL_TABLET | Freq: Every day | ORAL | Status: DC
  Administered 2021-04-04: 40 mg via ORAL
  Filled 2021-04-04: qty 1

## 2021-04-04 NOTE — Progress Notes (Signed)
PROGRESS NOTE                                                                                                                                                                                                             Patient Demographics:    Tracy Hammond, is a 80 y.o. female, DOB - 30-Jan-1941, HK:[id number]  Outpatient Primary MD for the patient is Hoyt Koch, MD    LOS - 1  Admit date - 04/25/2021    CC - Edema     Brief Narrative (HPI from H&P) -  Tracy Hammond is a 80 y.o. female with medical history significant for essential hypertension, type 2 diabetes, adjustment disorder, recently lost her husband in February 22 in the hospital who presented from home after a fall, her daughter thinks she fell because of the severe bilateral lower extremity edema, work-up in the ER showed AKI with massive edema and she was admitted for further treatment   Subjective:   Patient in bed, appears comfortable, denies any headache, no fever, no chest pain or pressure, no shortness of breath , no abdominal pain. No new focal weakness.    Assessment  & Plan :     Severe peripheral edema with AKI - she also has mild ascites, reason is not entirely clear, no previous history of heart liver or renal problems, stable UA, abdominal ultrasound suggests undiagnosed cirrhosis, albumin slightly low placed on appropriate supplementation, pending echocardiogram, continue to diurese with Lasix - Aldactone and monitor.  AKI is improving, continue compressive stockings and leg elevation along with fluid restriction.  2.  Essential hypertension.  Blood pressure is high placed on Coreg and monitor  3.  GERD.  Will place on PPI.  4. Deconditioning.  PT OT and monitor.  5.  Hypokalemia.  Replaced.    6.  Undiagnosed cirrhosis on ultrasound.  LFTs mildly elevated, INR stable, albumin mildly low placed on supplementation, check hepatitis panel  with outpatient GI follow-up.    7. DM Type II.  Sliding scale.  Lab Results  Component Value Date   HGBA1C 5.8 (H) 04/03/2021   CBG (last 3)  Recent Labs    04/03/21 2152 04/04/21 0750 04/04/21 1132  GLUCAP 109* 133* 199*          Condition - Extremely Guarded  Family Communication  :  daughter in rm 04/03/21, message left [phone] on 04/04/21 at 12.12pm  Code Status :  Full  Consults  :  None  PUD Prophylaxis : PPI   Procedures  :     CT Head and C Spine  - non acute.  ABD [place] -  1. Liver cirrhosis. Heterogeneous hepatic echotexture with areas of increased echogenicity but without well-defined mass by sonography. 2. Moderate ascites 3. Status post cholecystectomy   TTE -       Disposition Plan  :    Status is: Inpatient  Remains inpatient appropriate because:IV treatments appropriate due to intensity of illness or inability to take PO  Dispo: The patient is from: Home              Anticipated d/c is to: Home              Patient currently is not medically stable to d/c.   Difficult to place patient No  DVT Prophylaxis  :    enoxaparin (LOVENOX) injection 30 mg Start: 04/02/21 1000 SCDs Start: 04/02/21 0549   Lab Results  Component Value Date   PLT 181 04/04/2021    Diet :  Diet Order             Diet regular Room service appropriate? Yes; Fluid consistency: Thin  Diet effective now                    Inpatient Medications  Scheduled Meds:  (feeding supplement) PROSource Plus  30 mL Oral BID BM   aspirin EC  81 mg Oral q morning   brimonidine  1 drop Both Eyes Q8H   carvedilol  6.25 mg Oral BID WC   dorzolamide-timolol  1 drop Both Eyes BID   enoxaparin (LOVENOX) injection  30 mg Subcutaneous Q24H   feeding supplement  237 mL Oral BID BM   furosemide  40 mg Oral Daily   insulin aspart  0-9 Units Subcutaneous TID WC   lactose free nutrition  237 mL Oral TID WC   mirtazapine  15 mg Oral QHS   multivitamin with minerals  1 tablet  Oral q morning   potassium chloride SA  20 mEq Oral Daily   potassium chloride  40 mEq Oral BID   spironolactone  25 mg Oral Daily   Continuous Infusions: PRN Meds:.  Antibiotics  :    Anti-infectives (From admission, onward)    None        Time Spent in minutes  30   Lala Lund M.D on 04/04/2021 at 12:11 PM  To page go to [email or web address]   [facility] -  Office  [phone]  See all Orders from today for further details    Objective:   Vitals:   04/02/21 1914 04/03/21 2149 04/04/21 0541 04/04/21 0749  BP: (!) 157/103 (!) 112/50 122/88 (!) 125/102  Pulse: 98 81 81 84  Resp: '16 16 17 18  '$ Temp: 98 F (36.7 C) 98.1 F (36.7 C) 98.1 F (36.7 C)   TempSrc: Oral Oral Oral   SpO2: 93% 95% 91% 95%  Weight:      Height:        Wt Readings from Last 3 Encounters:  04/22/2021 58.1 kg  02/27/21 57.6 kg  02/04/21 58.4 kg     Intake/Output Summary (Last 24 hours) at 04/04/2021 1211 Last data filed at 04/04/2021 0300 Gross per 24 hour  Intake --  Output 800 ml  Net -800 ml  Physical Exam  Awake Alert, No new F.N deficits, Normal affect [place].AT,PERRAL Supple Neck,No JVD, No cervical lymphadenopathy appriciated.  Symmetrical Chest wall movement, Good air movement bilaterally, CTAB RRR,No Gallops,Rubs or new Murmurs, No Parasternal Heave +ve B.Sounds, Abd Soft, No tenderness, Mild ascites No Cyanosis, 3 + leg edema     Data Review:    CBC Recent Labs  Lab 04/07/2021 2357 04/02/21 1035 04/04/21 0034  WBC 10.9* 15.7* 10.2  HGB 15.0 16.2* 12.5  HCT 44.6 49.1* 37.9  PLT 150 204 181  MCV 92.0 91.9 90.7  MCH 30.9 30.3 29.9  MCHC 33.6 33.0 33.0  RDW 15.2 15.4 15.3  LYMPHSABS 1.4  --  1.1  MONOABS 0.5  --  0.6  EOSABS 0.0  --  0.0  BASOSABS 0.0  --  0.0    Recent Labs  Lab 04/03/2021 2357 04/02/21 0059 04/02/21 1035 04/03/21 0708 04/03/21 1042 04/04/21 0034  NA 139  --  138  --   --  140  K 2.9*  --  4.3  --   --  2.8*  CL 98  --  98  --    --  102  CO2 29  --  28  --   --  27  GLUCOSE 107*  --  73  --   --  150*  BUN 39*  --  36*  --   --  29*  CREATININE 1.47*  --  1.22*  --   --  0.87  CALCIUM 8.6*  --  8.6*  --   --  8.4*  AST  --   --   --   --  62* 52*  ALT  --   --   --   --  48* 37  ALKPHOS  --   --   --   --  321* 276*  BILITOT  --   --   --   --  2.1* 1.5*  ALBUMIN  --   --   --   --  3.2* 2.4*  MG  --  1.7  --   --   --  1.6*  INR  --   --   --   --  1.0  --   HGBA1C  --   --   --   --  5.8*  --   BNP 191.1*  --   --  364.3*  --  430.2*    ------------------------------------------------------------------------------------------------------------------ No results for input(s): CHOL, HDL, LDLCALC, TRIG, CHOLHDL, LDLDIRECT in the last 72 hours.  Lab Results  Component Value Date   HGBA1C 5.8 (H) 04/03/2021   ------------------------------------------------------------------------------------------------------------------ No results for input(s): TSH, T4TOTAL, T3FREE, THYROIDAB in the last 72 hours.  Invalid input(s): FREET3  Cardiac Enzymes No results for input(s): CKMB, TROPONINI, MYOGLOBIN in the last 168 hours.  Invalid input(s): CK ------------------------------------------------------------------------------------------------------------------    Component Value Date/Time   BNP 430.2 (H) 04/04/2021 0034     Radiology Reports CT HEAD WO CONTRAST (5MM)  Result Date: 04/02/2021 CLINICAL DATA:  Fall EXAM: CT HEAD WITHOUT CONTRAST CT CERVICAL SPINE WITHOUT CONTRAST TECHNIQUE: Multidetector CT imaging of the head and cervical spine was performed following the standard protocol without intravenous contrast. Multiplanar CT image reconstructions of the cervical spine were also generated. COMPARISON:  None. FINDINGS: CT HEAD FINDINGS Brain: There is no mass, hemorrhage or extra-axial collection. There is generalized atrophy without lobar predilection. There is hypoattenuation of the periventricular white  matter, most commonly indicating chronic ischemic microangiopathy. Vascular:  No abnormal hyperdensity of the major intracranial arteries or dural venous sinuses. No intracranial atherosclerosis. Skull: The visualized skull base, calvarium and extracranial soft tissues are normal. Sinuses/Orbits: No fluid levels or advanced mucosal thickening of the visualized paranasal sinuses. Left mastoid opacification. The orbits are normal. CT CERVICAL SPINE FINDINGS Alignment: No static subluxation. Facets are aligned. Occipital condyles are normally positioned. Skull base and vertebrae: No acute fracture. Soft tissues and spinal canal: No prevertebral fluid or swelling. No visible canal hematoma. Disc levels: No advanced spinal canal or neural foraminal stenosis. Upper chest: No pneumothorax, pulmonary nodule or pleural effusion. Other: Normal visualized paraspinal cervical soft tissues. IMPRESSION: 1. Chronic ischemic microangiopathy and generalized atrophy without acute intracranial abnormality. 2. No acute fracture or static subluxation of the cervical spine. Electronically Signed   By: Ulyses Jarred M.D.   On: 04/02/2021 01:40   CT Cervical Spine Wo Contrast  Result Date: 04/02/2021 CLINICAL DATA:  Fall EXAM: CT HEAD WITHOUT CONTRAST CT CERVICAL SPINE WITHOUT CONTRAST TECHNIQUE: Multidetector CT imaging of the head and cervical spine was performed following the standard protocol without intravenous contrast. Multiplanar CT image reconstructions of the cervical spine were also generated. COMPARISON:  None. FINDINGS: CT HEAD FINDINGS Brain: There is no mass, hemorrhage or extra-axial collection. There is generalized atrophy without lobar predilection. There is hypoattenuation of the periventricular white matter, most commonly indicating chronic ischemic microangiopathy. Vascular: No abnormal hyperdensity of the major intracranial arteries or dural venous sinuses. No intracranial atherosclerosis. Skull: The visualized  skull base, calvarium and extracranial soft tissues are normal. Sinuses/Orbits: No fluid levels or advanced mucosal thickening of the visualized paranasal sinuses. Left mastoid opacification. The orbits are normal. CT CERVICAL SPINE FINDINGS Alignment: No static subluxation. Facets are aligned. Occipital condyles are normally positioned. Skull base and vertebrae: No acute fracture. Soft tissues and spinal canal: No prevertebral fluid or swelling. No visible canal hematoma. Disc levels: No advanced spinal canal or neural foraminal stenosis. Upper chest: No pneumothorax, pulmonary nodule or pleural effusion. Other: Normal visualized paraspinal cervical soft tissues. IMPRESSION: 1. Chronic ischemic microangiopathy and generalized atrophy without acute intracranial abnormality. 2. No acute fracture or static subluxation of the cervical spine. Electronically Signed   By: Ulyses Jarred M.D.   On: 04/02/2021 01:40   US Abdomen Complete  Result Date: 04/03/2021 CLINICAL DATA:  Ascites and weight loss EXAM: ABDOMEN ULTRASOUND COMPLETE COMPARISON:  None. FINDINGS: Gallbladder: Surgically absent Common bile duct: Diameter: 6 mm Liver: Heterogeneous hepatic echotexture with areas of increased echogenicity throughout the parenchyma but no well-defined mass. Nodular hepatic contour consistent with cirrhosis. Portal vein is patent on color Doppler imaging with normal direction of blood flow towards the liver. IVC: No abnormality visualized. Pancreas: Not seen due to bowel gas Spleen: Size and appearance within normal limits. Right Kidney: Length: 10.4 cm. Echogenicity within normal limits. No mass or hydronephrosis visualized. Left Kidney: Length: 9.1 cm. Echogenicity within normal limits. No mass or hydronephrosis visualized. Abdominal aorta: No aneurysm visualized. Other findings: Moderate ascites in the abdomen. IMPRESSION: 1. Liver cirrhosis. Heterogeneous hepatic echotexture with areas of increased echogenicity but without  well-defined mass by sonography. 2. Moderate ascites 3. Status post cholecystectomy Electronically Signed   By: Donavan Foil M.D.   On: 04/03/2021 15:40   DG Chest Port 1 View  Result Date: 04/02/2021 CLINICAL DATA:  Edema. EXAM: PORTABLE CHEST 1 VIEW COMPARISON:  09/28/2018 FINDINGS: Low lung volumes. The heart is normal in size. Aortic atherosclerosis. Probable mitral annulus calcifications. There is no  pulmonary edema. No focal airspace disease. No pleural fluid or pneumothorax. No acute osseous abnormalities are seen. IMPRESSION: Low lung volumes without acute abnormality. Electronically Signed   By: Keith Rake M.D.   On: 04/02/2021 00:24

## 2021-04-04 NOTE — Progress Notes (Signed)
Patient had an unsuccessful attempt of emptying her bladder. This nurse performed bladder scan which showed 770ms. Dr SCandiss Norsenotified and got order for one time in and out cath. In and out cath performed using sterile technique. Output for in and out was 1200 ml.

## 2021-04-04 NOTE — Progress Notes (Signed)
  Echocardiogram 2D Echocardiogram has been performed.  Tracy Hammond 04/04/2021, 10:28 AM

## 2021-04-04 NOTE — Plan of Care (Signed)
  Problem: Education: Goal: Knowledge of General Education information will improve Description Including pain rating scale, medication(s)/side effects and non-pharmacologic comfort measures Outcome: Progressing   Problem: Health Behavior/Discharge Planning: Goal: Ability to manage health-related needs will improve Outcome: Progressing   

## 2021-04-05 ENCOUNTER — Other Ambulatory Visit (HOSPITAL_COMMUNITY): Payer: Medicare Other

## 2021-04-05 ENCOUNTER — Observation Stay (HOSPITAL_COMMUNITY): Payer: Medicare Other

## 2021-04-05 DIAGNOSIS — R160 Hepatomegaly, not elsewhere classified: Secondary | ICD-10-CM | POA: Diagnosis not present

## 2021-04-05 DIAGNOSIS — R935 Abnormal findings on diagnostic imaging of other abdominal regions, including retroperitoneum: Secondary | ICD-10-CM

## 2021-04-05 DIAGNOSIS — Z515 Encounter for palliative care: Secondary | ICD-10-CM

## 2021-04-05 DIAGNOSIS — K7460 Unspecified cirrhosis of liver: Secondary | ICD-10-CM

## 2021-04-05 DIAGNOSIS — Z7189 Other specified counseling: Secondary | ICD-10-CM

## 2021-04-05 DIAGNOSIS — R188 Other ascites: Secondary | ICD-10-CM

## 2021-04-05 DIAGNOSIS — Z8701 Personal history of pneumonia (recurrent): Secondary | ICD-10-CM | POA: Diagnosis not present

## 2021-04-05 DIAGNOSIS — N2889 Other specified disorders of kidney and ureter: Secondary | ICD-10-CM | POA: Diagnosis not present

## 2021-04-05 DIAGNOSIS — R609 Edema, unspecified: Secondary | ICD-10-CM | POA: Diagnosis not present

## 2021-04-05 DIAGNOSIS — K439 Ventral hernia without obstruction or gangrene: Secondary | ICD-10-CM | POA: Diagnosis not present

## 2021-04-05 DIAGNOSIS — K5730 Diverticulosis of large intestine without perforation or abscess without bleeding: Secondary | ICD-10-CM | POA: Diagnosis not present

## 2021-04-05 LAB — COMPREHENSIVE METABOLIC PANEL
ALT: 44 U/L (ref 0–44)
AST: 52 U/L — ABNORMAL HIGH (ref 15–41)
Albumin: 2.4 g/dL — ABNORMAL LOW (ref 3.5–5.0)
Alkaline Phosphatase: 257 U/L — ABNORMAL HIGH (ref 38–126)
Anion gap: 9 (ref 5–15)
BUN: 32 mg/dL — ABNORMAL HIGH (ref 8–23)
CO2: 28 mmol/L (ref 22–32)
Calcium: 8.7 mg/dL — ABNORMAL LOW (ref 8.9–10.3)
Chloride: 104 mmol/L (ref 98–111)
Creatinine, Ser: 0.83 mg/dL (ref 0.44–1.00)
GFR, Estimated: >60 mL/min (ref >60)
Glucose, Bld: 254 mg/dL — ABNORMAL HIGH (ref 70–99)
Potassium: 3.7 mmol/L (ref 3.5–5.1)
Sodium: 141 mmol/L (ref 135–145)
Total Bilirubin: 1.2 mg/dL (ref 0.3–1.2)
Total Protein: 5 g/dL — ABNORMAL LOW (ref 6.5–8.1)

## 2021-04-05 LAB — GLUCOSE, CAPILLARY
Glucose-Capillary: 190 mg/dL — ABNORMAL HIGH (ref 70–99)
Glucose-Capillary: 177 mg/dL — ABNORMAL HIGH (ref 70–99)
Glucose-Capillary: 260 mg/dL — ABNORMAL HIGH (ref 70–99)

## 2021-04-05 LAB — CBC WITH DIFFERENTIAL/PLATELET
Abs Immature Granulocytes: 0.03 10*3/uL (ref 0.00–0.07)
Basophils Absolute: 0 10*3/uL (ref 0.0–0.1)
Basophils Relative: 0 %
Eosinophils Absolute: 0 10*3/uL (ref 0.0–0.5)
Eosinophils Relative: 0 %
HCT: 38.5 % (ref 36.0–46.0)
Hemoglobin: 12.8 g/dL (ref 12.0–15.0)
Immature Granulocytes: 0 %
Lymphocytes Relative: 12 %
Lymphs Abs: 1.1 10*3/uL (ref 0.7–4.0)
MCH: 30.5 pg (ref 26.0–34.0)
MCHC: 33.2 g/dL (ref 30.0–36.0)
MCV: 91.7 fL (ref 80.0–100.0)
Monocytes Absolute: 0.5 10*3/uL (ref 0.1–1.0)
Monocytes Relative: 6 %
Neutro Abs: 7.1 10*3/uL (ref 1.7–7.7)
Neutrophils Relative %: 82 %
Platelets: 144 10*3/uL — ABNORMAL LOW (ref 150–400)
RBC: 4.2 MIL/uL (ref 3.87–5.11)
RDW: 15.6 % — ABNORMAL HIGH (ref 11.5–15.5)
WBC: 8.7 10*3/uL (ref 4.0–10.5)
nRBC: 0 % (ref 0.0–0.2)

## 2021-04-05 LAB — LACTATE DEHYDROGENASE: LDH: 229 U/L — ABNORMAL HIGH (ref 98–192)

## 2021-04-05 LAB — MAGNESIUM: Magnesium: 2.2 mg/dL (ref 1.7–2.4)

## 2021-04-05 LAB — BRAIN NATRIURETIC PEPTIDE: B Natriuretic Peptide: 586.2 pg/mL — ABNORMAL HIGH (ref 0.0–100.0)

## 2021-04-05 MED ORDER — TAMSULOSIN HCL 0.4 MG PO CAPS
0.4000 mg | ORAL_CAPSULE | Freq: Every day | ORAL | Status: DC
  Administered 2021-04-05: 0.4 mg via ORAL
  Filled 2021-04-05: qty 1

## 2021-04-05 MED ORDER — ACETAMINOPHEN 325 MG PO TABS
650.0000 mg | ORAL_TABLET | Freq: Four times a day (QID) | ORAL | Status: DC | PRN
  Administered 2021-04-05: 650 mg via ORAL
  Filled 2021-04-05: qty 2

## 2021-04-05 MED ORDER — SODIUM CHLORIDE 0.9 % IV SOLN
12.5000 mg | Freq: Once | INTRAVENOUS | Status: AC
  Administered 2021-04-05: 12.5 mg via INTRAVENOUS
  Filled 2021-04-05: qty 12.5

## 2021-04-05 MED ORDER — FUROSEMIDE 40 MG PO TABS
60.0000 mg | ORAL_TABLET | Freq: Every day | ORAL | Status: DC
  Administered 2021-04-05: 60 mg via ORAL
  Filled 2021-04-05: qty 1

## 2021-04-05 MED ORDER — HALOPERIDOL LACTATE 5 MG/ML IJ SOLN
1.0000 mg | Freq: Four times a day (QID) | INTRAMUSCULAR | Status: DC | PRN
  Administered 2021-04-05 (×2): 1 mg via INTRAVENOUS
  Filled 2021-04-05 (×2): qty 1

## 2021-04-05 MED ORDER — LORAZEPAM 2 MG/ML IJ SOLN: 0.2500 mg | Freq: Once | INTRAMUSCULAR | Status: DC | PRN

## 2021-04-05 MED ORDER — POTASSIUM CHLORIDE CRYS ER 20 MEQ PO TBCR
40.0000 meq | EXTENDED_RELEASE_TABLET | Freq: Every day | ORAL | Status: DC
  Administered 2021-04-05: 40 meq via ORAL
  Filled 2021-04-05: qty 2

## 2021-04-05 NOTE — Consult Note (Addendum)
Palliative Medicine Inpatient Consult Note  Consulting Provider: Thurnell Lose, MD  Reason for consult:   [place] [facility] Consult  Reason for Consult? Likely has unknown liver malignancy with cirrhosis, ascites, severely wasted, goals of care   HPI:  Per intake H&P -->   Tracy Hammond is a 80 y.o. female with medical history significant for essential hypertension, type 2 diabetes, adjustment disorder, recently lost her husband in February 22 in the hospital who presented from home after a fall, her daughter thinks she fell because of the severe bilateral lower extremity edema, work-up in the ER showed AKI with massive edema and she was admitted for further treatment. Palliative care was consulted in the setting of suspected hepatocellular carcinoma to have goals of care conversations.  Clinical Assessment/Goals of Care:  *Please note that this is a verbal dictation therefore any spelling or grammatical errors are due to the "[employer]" system interpretation.  I have reviewed medical records including EPIC notes, labs and imaging, received report from bedside RN, assessed the patient who is awake and lying in bed, she is asking to use the bathroom and is gotten OOB to the commode.    I met with Tracy Hammond, Tracy Hammond and Tracy Hammond to further discuss diagnosis prognosis, GOC, EOL wishes, disposition and options.  We reviewed that Tracy Hammond had lost her spouse, Tracy Hammond just a few months ago. Since then her condition has declined she has been having ongoing GI symptoms which preceded the patients admission. She has had a very poor appetite. In addition to this she has suffered from significant weight loss per families estimation > 50lbs in one year. She has also had profound weakness and had "new" "water blisters" on her legs. Per the patient and her family her situation has been declining for months now.   Tracy Hammond at baseline suffers from  significant glaucoma, diabetes, and arthritis. She has experienced worsening of her vision for years now.    I introduced Palliative Medicine as specialized medical care for people living with serious illness. It focuses on providing relief from the symptoms and stress of a serious illness. The goal is to improve quality of life for both the patient and the family.  Tracy Hammond lives with one of her nephews in the [place] area. She is a widow and has three children. 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 She is a woman who loves hr family and also values her autonomy. She is of faith though is non-denominational.   A detailed discussion was had today regarding advanced directives - there are none formally filed but the patient does endorse.    Concepts specific to code status, artifical feeding and hydration, continued IV antibiotics and rehospitalization was had. Verified DNAR/DNI per patients wishes. She has shared that she is aware that "she is dying".  Per her family she has also been saying this to them regularly. She accepts this and appears at peace with it.   While in the room the nursing staff came in to place a catheter. Patients daughter, Tracy Hammond and I stepped out. We called her sister on the speakerphone and discussed the present clinical information as per the chart. We reviewed that likely the information which will be revealed via additional diagnostics studies may be less than encouraging. Patients daughters shared that they would ideally like to start the process of  getting hospice involved now that way there will be no delays at the time of discharge. I shared that I would reach out to the transitions of care team to aid in supporting this effort.   Patients daughter, Tracy Hammond spend quite a bit of time sharing with me her grief of the loss of her father. She expresses that she knows her mother has  given up as she no longer has the "fight" she once had. I spent time listening to her stories offering support through therapeutic listening.   Discussed the importance of continued conversation with family and their  medical providers regarding overall plan of care and treatment options, ensuring decisions are within the context of the patients values and GOCs.  Decision Maker: Tracy Hammond [phone]  SUMMARY OF RECOMMENDATIONS   DNAR/DNI  Patients family is interested to see what additional diagnostic studies show inclusive of the CT Abd/Pelvis and Paracentesis  Patients daughters would like to consider home hospice if prognosis is poor, they do not have a preference over the hospice agency  Ongoing PMT support in the oncoming days  Code Status/Advance Care Planning: DNAR/DNI   Palliative Prophylaxis:  Oral Care, Mobility  Additional Recommendations (Limitations, Scope, Preferences): Continue current scope of care   Psycho-social/Spiritual:  Desire for further Chaplaincy support: None Additional Recommendations: Education on hepatocellular carcinoma   Prognosis: Poor in the setting of multi-system failure. Hospice is an appropriate consideration.   Discharge Planning: Discharge per primary.   Vitals:   04/05/21 0431 04/05/21 1105  BP: (!) 141/56 120/60  Pulse: 71   Resp: 18 (!) 21  Temp: 98.3 F (36.8 C) 97.7 F (36.5 C)  SpO2: 100% 100%    Intake/Output Summary (Last 24 hours) at 04/05/2021 1201 Last data filed at 04/05/2021 5436 Gross per 24 hour  Intake 150 ml  Output 1550 ml  Net -1400 ml   Last Weight  Most recent update: 04/19/2021 11:26 PM    Weight  58.1 kg (128 lb)            Gen:  Very ill appearing F HEENT: moist mucous membranes CV: Regular rate and rhythm  PULM: On 2LPM, (+) Rhonchi ABD: (+) distention, (+) shifting dullness EXT: (+) edema, (+) BLE leg dressings Neuro: Alert and oriented x2  PPS: 20%   This conversation/these  recommendations were discussed with patient primary care team, Dr. Candiss Norse  Time In: 1200 Time Out: 1326 Total Time: 86 Greater than 50%  of this time was spent counseling and coordinating care related to the above assessment and plan.  [place] Team Team Cell Phone: [phone] Please utilize secure chat with additional questions, if there is no response within 30 minutes please call the above phone number  Palliative Medicine Team providers are available by phone from 7am to 7pm daily and can be reached through the team cell phone.  Should this patient require assistance outside of these hours, please call the patient's attending physician.

## 2021-04-05 NOTE — Progress Notes (Addendum)
PROGRESS NOTE                                                                                                                                                                                                             Patient Demographics:    Tracy Hammond, is a 80 y.o. female, DOB - Nov 16, 1940, [id number]  Outpatient Primary MD for the patient is Hoyt Koch, MD    LOS - 1  Admit date - 04/16/2021    CC - Edema     Brief Narrative (HPI from H&P) -  Tracy Hammond is a 80 y.o. female with medical history significant for essential hypertension, type 2 diabetes, adjustment disorder, recently lost her husband in February 22 in the hospital who presented from home after a fall, her daughter thinks she fell because of the severe bilateral lower extremity edema, work-up in the ER showed AKI with massive edema and she was admitted for further treatment   Subjective:   Bed appears extremely frail and cachectic, denies any headache chest or abdominal pain, feels weak all over.   Assessment  & Plan :     Severe peripheral edema with AKI - she also has mild ascites, reason is not entirely clear, no previous history of heart liver or renal problems, stable UA, abdominal ultrasound suggests undiagnosed cirrhosis, albumin slightly low placed on appropriate supplementation, stable echocardiogram, continue to diurese with Lasix - Aldactone and monitor.  AKI is improving, continue compressive stockings and leg elevation along with fluid restriction.  2.  Unintentional 50 pound weight loss with peripheral edema and ascites.  Work-up so far suggestive of possible liver cirrhosis and malignancy, hepatitis panel negative, continue to diurese, discussed with daughter, DNR for now as she is extremely frail and cachectic.  Will obtain dedicated CT liver with IV contrast and get GI input.  3.  GERD.  Will place on PPI.  4. Deconditioning.   PT OT and monitor.  5.  Hypokalemia.  Replaced.    6.  Essential hypertension.  Blood pressure is high placed on Coreg and monitor  7.  Recurrent bladder outlet obstruction.  Did I&O x 2, Flomax and Foley now.    8. DM Type II.  Sliding scale.  Lab Results  Component Value Date   HGBA1C 5.8 (H) 04/03/2021   CBG (last 3)  Recent Labs    04/04/21 1629 04/04/21 2133 04/05/21 0750  GLUCAP 182* 156* 190*          Condition - Extremely Guarded  Family Communication  :  daughter in rm 04/03/21, message left [phone] on 04/04/21 at 12.12pm, updated 04/05/2021  Code Status : DNR  Consults  : GI, palliative care  PUD Prophylaxis : PPI   Procedures  :     CT Head and C Spine  - non acute.  ABD [place] -  1. Liver cirrhosis. Heterogeneous hepatic echotexture with areas of increased echogenicity but without well-defined mass by sonography. 2. Moderate ascites 3. Status post cholecystectomy  TTE -  1. Left ventricular ejection fraction, by estimation, is 70 to 75%. The left ventricle has hyperdynamic function. The left ventricle has no regional wall motion abnormalities. Left ventricular diastolic parameters were normal.  2. Right ventricular systolic function is moderately reduced. The right ventricular size is moderately enlarged. There is normal pulmonary artery systolic pressure.  3. The mitral valve is grossly normal. Trivial mitral valve regurgitation.  4. The aortic valve is tricuspid. There is mild calcification of the aortic valve. Aortic valve regurgitation is not visualized.  CT chest abdomen pelvis without contrast. -   CT right upper quadrant liver with contrast -       Disposition Plan  :    Status is: Inpatient  Remains inpatient appropriate because:IV treatments appropriate due to intensity of illness or inability to take PO  Dispo: The patient is from: Home              Anticipated d/c is to: Home              Patient currently is not medically stable to  d/c.   Difficult to place patient No  DVT Prophylaxis  :    enoxaparin (LOVENOX) injection 30 mg Start: 04/02/21 1000 SCDs Start: 04/02/21 0549   Lab Results  Component Value Date   PLT 144 (L) 04/05/2021    Diet :  Diet Order             Diet regular Room service appropriate? Yes; Fluid consistency: Thin  Diet effective now                    Inpatient Medications  Scheduled Meds:  (feeding supplement) PROSource Plus  30 mL Oral BID BM   aspirin EC  81 mg Oral q morning   brimonidine  1 drop Both Eyes Q8H   carvedilol  6.25 mg Oral BID WC   dorzolamide-timolol  1 drop Both Eyes BID   enoxaparin (LOVENOX) injection  30 mg Subcutaneous Q24H   feeding supplement  237 mL Oral BID BM   furosemide  60 mg Oral Daily   insulin aspart  0-9 Units Subcutaneous TID WC   lactose free nutrition  237 mL Oral TID WC   mirtazapine  15 mg Oral QHS   multivitamin with minerals  1 tablet Oral q morning   potassium chloride  40 mEq Oral Daily   spironolactone  25 mg Oral Daily   Continuous Infusions: PRN Meds:.  Antibiotics  :    Anti-infectives (From admission, onward)    None        Time Spent in minutes  30   Lala Lund M.D on 04/05/2021 at 11:00 AM  To page go to [email or web address]   [facility] -  Office  [phone]  See all Orders from today for further  details    Objective:   Vitals:   04/04/21 1334 04/04/21 2050 04/05/21 0339 04/05/21 0431  BP: 116/62 117/61 135/62 (!) 141/56  Pulse: 79 75 72 71  Resp: '17 19  18  '$ Temp: 98.1 F (36.7 C) 98.6 F (37 C) 97.7 F (36.5 C) 98.3 F (36.8 C)  TempSrc: Oral  Oral   SpO2: 94% 90% 98% 100%  Weight:      Height:        Wt Readings from Last 3 Encounters:  04/30/2021 58.1 kg  02/27/21 57.6 kg  02/04/21 58.4 kg     Intake/Output Summary (Last 24 hours) at 04/05/2021 1100 Last data filed at 04/05/2021 0328 Gross per 24 hour  Intake 150 ml  Output 1550 ml  Net -1400 ml     Physical  Exam  Awake Alert, No new F.N deficits, is extremely weak and cachectic, mild ascites, 2+ leg edema [place].AT,PERRAL Supple Neck,No JVD, No cervical lymphadenopathy appriciated.  Symmetrical Chest wall movement, coarse breath sounds RRR,No Gallops, Rubs or new Murmurs, No Parasternal Heave +ve B.Sounds, Abd Soft    Pressure Injury 04/05/21 Buttocks Other (Comment);Medial pink/red (Active)  04/05/21 0300  Location: Buttocks  Location Orientation: Other (Comment);Medial (inner)  Staging:   Wound Description (Comments): pink/red  Present on Admission:      Data Review:    CBC Recent Labs  Lab 04/13/2021 2357 04/02/21 1035 04/04/21 0034 04/05/21 0205  WBC 10.9* 15.7* 10.2 8.7  HGB 15.0 16.2* 12.5 12.8  HCT 44.6 49.1* 37.9 38.5  PLT 150 204 181 144*  MCV 92.0 91.9 90.7 91.7  MCH 30.9 30.3 29.9 30.5  MCHC 33.6 33.0 33.0 33.2  RDW 15.2 15.4 15.3 15.6*  LYMPHSABS 1.4  --  1.1 1.1  MONOABS 0.5  --  0.6 0.5  EOSABS 0.0  --  0.0 0.0  BASOSABS 0.0  --  0.0 0.0    Recent Labs  Lab 04/19/2021 2357 04/02/21 0059 04/02/21 1035 04/03/21 0708 04/03/21 1042 04/04/21 0034 04/04/21 1645 04/05/21 0205  NA 139  --  138  --   --  140  --  141  K 2.9*  --  4.3  --   --  2.8* 3.3* 3.7  CL 98  --  98  --   --  102  --  104  CO2 29  --  28  --   --  27  --  28  GLUCOSE 107*  --  73  --   --  150*  --  254*  BUN 39*  --  36*  --   --  29*  --  32*  CREATININE 1.47*  --  1.22*  --   --  0.87  --  0.83  CALCIUM 8.6*  --  8.6*  --   --  8.4*  --  8.7*  AST  --   --   --   --  62* 52*  --  52*  ALT  --   --   --   --  48* 37  --  44  ALKPHOS  --   --   --   --  321* 276*  --  257*  BILITOT  --   --   --   --  2.1* 1.5*  --  1.2  ALBUMIN  --   --   --   --  3.2* 2.4*  --  2.4*  MG  --  1.7  --   --   --  1.6*  --  2.2  INR  --   --   --   --  1.0  --   --   --   HGBA1C  --   --   --   --  5.8*  --   --   --   BNP 191.1*  --   --  364.3*  --  430.2*  --  586.2*     ------------------------------------------------------------------------------------------------------------------ No results for input(s): CHOL, HDL, LDLCALC, TRIG, CHOLHDL, LDLDIRECT in the last 72 hours.  Lab Results  Component Value Date   HGBA1C 5.8 (H) 04/03/2021   ------------------------------------------------------------------------------------------------------------------ No results for input(s): TSH, T4TOTAL, T3FREE, THYROIDAB in the last 72 hours.  Invalid input(s): FREET3  Cardiac Enzymes No results for input(s): CKMB, TROPONINI, MYOGLOBIN in the last 168 hours.  Invalid input(s): CK ------------------------------------------------------------------------------------------------------------------    Component Value Date/Time   BNP 586.2 (H) 04/05/2021 0205     Radiology Reports CT HEAD WO CONTRAST (5MM)  Result Date: 04/02/2021 CLINICAL DATA:  Fall EXAM: CT HEAD WITHOUT CONTRAST CT CERVICAL SPINE WITHOUT CONTRAST TECHNIQUE: Multidetector CT imaging of the head and cervical spine was performed following the standard protocol without intravenous contrast. Multiplanar CT image reconstructions of the cervical spine were also generated. COMPARISON:  None. FINDINGS: CT HEAD FINDINGS Brain: There is no mass, hemorrhage or extra-axial collection. There is generalized atrophy without lobar predilection. There is hypoattenuation of the periventricular white matter, most commonly indicating chronic ischemic microangiopathy. Vascular: No abnormal hyperdensity of the major intracranial arteries or dural venous sinuses. No intracranial atherosclerosis. Skull: The visualized skull base, calvarium and extracranial soft tissues are normal. Sinuses/Orbits: No fluid levels or advanced mucosal thickening of the visualized paranasal sinuses. Left mastoid opacification. The orbits are normal. CT CERVICAL SPINE FINDINGS Alignment: No static subluxation. Facets are aligned. Occipital condyles  are normally positioned. Skull base and vertebrae: No acute fracture. Soft tissues and spinal canal: No prevertebral fluid or swelling. No visible canal hematoma. Disc levels: No advanced spinal canal or neural foraminal stenosis. Upper chest: No pneumothorax, pulmonary nodule or pleural effusion. Other: Normal visualized paraspinal cervical soft tissues. IMPRESSION: 1. Chronic ischemic microangiopathy and generalized atrophy without acute intracranial abnormality. 2. No acute fracture or static subluxation of the cervical spine. Electronically Signed   By: Ulyses Jarred M.D.   On: 04/02/2021 01:40   CT Cervical Spine Wo Contrast  Result Date: 04/02/2021 CLINICAL DATA:  Fall EXAM: CT HEAD WITHOUT CONTRAST CT CERVICAL SPINE WITHOUT CONTRAST TECHNIQUE: Multidetector CT imaging of the head and cervical spine was performed following the standard protocol without intravenous contrast. Multiplanar CT image reconstructions of the cervical spine were also generated. COMPARISON:  None. FINDINGS: CT HEAD FINDINGS Brain: There is no mass, hemorrhage or extra-axial collection. There is generalized atrophy without lobar predilection. There is hypoattenuation of the periventricular white matter, most commonly indicating chronic ischemic microangiopathy. Vascular: No abnormal hyperdensity of the major intracranial arteries or dural venous sinuses. No intracranial atherosclerosis. Skull: The visualized skull base, calvarium and extracranial soft tissues are normal. Sinuses/Orbits: No fluid levels or advanced mucosal thickening of the visualized paranasal sinuses. Left mastoid opacification. The orbits are normal. CT CERVICAL SPINE FINDINGS Alignment: No static subluxation. Facets are aligned. Occipital condyles are normally positioned. Skull base and vertebrae: No acute fracture. Soft tissues and spinal canal: No prevertebral fluid or swelling. No visible canal hematoma. Disc levels: No advanced spinal canal or neural foraminal  stenosis. Upper chest: No pneumothorax, pulmonary nodule or pleural effusion. Other:  Normal visualized paraspinal cervical soft tissues. IMPRESSION: 1. Chronic ischemic microangiopathy and generalized atrophy without acute intracranial abnormality. 2. No acute fracture or static subluxation of the cervical spine. Electronically Signed   By: Ulyses Jarred M.D.   On: 04/02/2021 01:40   US Abdomen Complete  Result Date: 04/03/2021 CLINICAL DATA:  Ascites and weight loss EXAM: ABDOMEN ULTRASOUND COMPLETE COMPARISON:  None. FINDINGS: Gallbladder: Surgically absent Common bile duct: Diameter: 6 mm Liver: Heterogeneous hepatic echotexture with areas of increased echogenicity throughout the parenchyma but no well-defined mass. Nodular hepatic contour consistent with cirrhosis. Portal vein is patent on color Doppler imaging with normal direction of blood flow towards the liver. IVC: No abnormality visualized. Pancreas: Not seen due to bowel gas Spleen: Size and appearance within normal limits. Right Kidney: Length: 10.4 cm. Echogenicity within normal limits. No mass or hydronephrosis visualized. Left Kidney: Length: 9.1 cm. Echogenicity within normal limits. No mass or hydronephrosis visualized. Abdominal aorta: No aneurysm visualized. Other findings: Moderate ascites in the abdomen. IMPRESSION: 1. Liver cirrhosis. Heterogeneous hepatic echotexture with areas of increased echogenicity but without well-defined mass by sonography. 2. Moderate ascites 3. Status post cholecystectomy Electronically Signed   By: Donavan Foil M.D.   On: 04/03/2021 15:40   DG Chest Port 1 View  Result Date: 04/02/2021 CLINICAL DATA:  Edema. EXAM: PORTABLE CHEST 1 VIEW COMPARISON:  09/28/2018 FINDINGS: Low lung volumes. The heart is normal in size. Aortic atherosclerosis. Probable mitral annulus calcifications. There is no pulmonary edema. No focal airspace disease. No pleural fluid or pneumothorax. No acute osseous abnormalities are seen.  IMPRESSION: Low lung volumes without acute abnormality. Electronically Signed   By: Keith Rake M.D.   On: 04/02/2021 00:24   ECHOCARDIOGRAM COMPLETE  Result Date: 04/04/2021    ECHOCARDIOGRAM REPORT   Patient Name:   CHERISA LITTLEPAGE Date of Exam: 04/04/2021 Medical Rec #:  [id number]      Height:       61.0 in Accession #:    [id number]     Weight:       128.0 lb Date of Birth:  10-20-1940      BSA:          1.562 m Patient Age:    33 years       BP:           122/88 mmHg Patient Gender: F              HR:           76 bpm. Exam Location:  Inpatient Procedure: 2D Echo, Color Doppler and Cardiac Doppler Indications:    CHF  History:        Patient has prior history of Echocardiogram examinations, most                 recent 04/07/2018. Risk Factors:Diabetes, Dyslipidemia and                 Hypertension.  Sonographer:    Bernadene Person RDCS Referring Phys: Arnell Asal Margaree Mackintosh [place]  1. Left ventricular ejection fraction, by estimation, is 70 to 75%. The left ventricle has hyperdynamic function. The left ventricle has no regional wall motion abnormalities. Left ventricular diastolic parameters were normal.  2. Right ventricular systolic function is moderately reduced. The right ventricular size is moderately enlarged. There is normal pulmonary artery systolic pressure.  3. The mitral valve is grossly normal. Trivial mitral valve regurgitation.  4. The aortic valve is tricuspid. There is mild  calcification of the aortic valve. Aortic valve regurgitation is not visualized. FINDINGS  Left Ventricle: Left ventricular ejection fraction, by estimation, is 70 to 75%. The left ventricle has hyperdynamic function. The left ventricle has no regional wall motion abnormalities. The left ventricular internal cavity size was normal in size. There is no left ventricular hypertrophy. Left ventricular diastolic parameters were normal. Right Ventricle: The right ventricular size is moderately enlarged. Right vetricular wall  thickness was not well visualized. Right ventricular systolic function is moderately reduced. There is normal pulmonary artery systolic pressure. The tricuspid regurgitant velocity is 2.44 m/s, and with an assumed right atrial pressure of 3 mmHg, the estimated right ventricular systolic pressure is 0000000 mmHg. Left Atrium: Left atrial size was normal in size. Right Atrium: Right atrial size was normal in size. Pericardium: There is no evidence of pericardial effusion. Mitral Valve: The mitral valve is grossly normal. Mild to moderate mitral annular calcification. Trivial mitral valve regurgitation. Tricuspid Valve: The tricuspid valve is grossly normal. Tricuspid valve regurgitation is trivial. Aortic Valve: The aortic valve is tricuspid. There is mild calcification of the aortic valve. Aortic valve regurgitation is not visualized. Pulmonic Valve: The pulmonic valve was not well visualized. Pulmonic valve regurgitation is not visualized. Aorta: The aortic root and ascending aorta are structurally normal, with no evidence of dilitation. IAS/Shunts: The atrial septum is grossly normal.  LEFT VENTRICLE PLAX 2D LVIDd:         2.60 cm  Diastology LVIDs:         1.70 cm  LV e' medial:    7.28 cm/s LV PW:         1.00 cm  LV E/e' medial:  10.8 LV IVS:        1.00 cm  LV e' lateral:   8.63 cm/s LVOT diam:     1.70 cm  LV E/e' lateral: 9.1 LV SV:         45 LV SV Index:   29 LVOT Area:     2.27 cm  RIGHT VENTRICLE RV S prime:     11.10 cm/s TAPSE (M-mode): 1.3 cm LEFT ATRIUM             Index       RIGHT ATRIUM           Index LA diam:        3.50 cm 2.24 cm/m  RA Area:     11.50 cm LA Vol (A2C):   35.1 ml 22.46 ml/m RA Volume:   22.00 ml  14.08 ml/m LA Vol (A4C):   30.1 ml 19.23 ml/m LA Biplane Vol: 35.6 ml 22.78 ml/m  AORTIC VALVE LVOT Vmax:   108.00 cm/s LVOT Vmean:  68.600 cm/s LVOT VTI:    0.199 m  AORTA Ao Root diam: 2.90 cm Ao Asc diam:  3.10 cm MITRAL VALVE                TRICUSPID VALVE MV Area (PHT): 1.76 cm      TR Peak grad:   23.8 mmHg MV Decel Time: 430 msec     TR Vmax:        244.00 cm/s MV E velocity: 78.30 cm/s MV A velocity: 130.00 cm/s  SHUNTS MV E/A ratio:  0.60         Systemic VTI:  0.20 m  Systemic Diam: 1.70 cm Mertie Moores MD Electronically signed by Mertie Moores MD Signature Date/Time: 04/04/2021/12:27:54 PM    Final    CT CHEST ABDOMEN PELVIS WO CONTRAST  Result Date: 04/05/2021 CLINICAL DATA:  Pneumonia.  Severe peripheral edema. EXAM: CT CHEST, ABDOMEN AND PELVIS WITHOUT CONTRAST TECHNIQUE: Multidetector CT imaging of the chest, abdomen and pelvis was performed following the standard protocol without IV contrast. COMPARISON:  10/25/2004 FINDINGS: CT CHEST FINDINGS Cardiovascular: Prominent mitral annular calcifications. Diffuse coronary artery calcifications. Normal caliber of the thoracic aorta. Atherosclerotic calcifications involving the aortic arch and descending thoracic aorta. Mediastinum/Nodes: Calcified right thyroid nodule. Concern for subcarinal lymphadenopathy on sequence 3, image 18 measuring up to 2.1 cm. This represents a significant change since 2006. Overall, limited evaluation for lymphadenopathy due to the lack of intravascular contrast. No evidence for axillary lymphadenopathy. No significant esophageal dilatation. Lungs/Pleura: There are 2 foci of airspace disease or ground-glass opacities in the right upper lung. At least 1 small focus of ground-glass opacity in the medial left upper lung on sequence 4 image 15. Few densities in left lower lobe are suggestive for atelectasis. Central consolidation in the right lower lobe on sequence 4, image 58. Additional branching and patchy opacities in the periphery of the right lower lobe. Trace right pleural fluid. Musculoskeletal: No suspicious osseous lesions in the chest. CT ABDOMEN PELVIS FINDINGS Hepatobiliary: Perihepatic ascites. Significant artifact on this examination but there is still concern for  several hypodense hepatic lesions. Possible lesion in the right hepatic lobe on sequence 3, image 50 measuring roughly 2.9 cm. Question a lesion at the posterior right hepatic dome on sequence 3, image 32. Liver may have a slightly nodular contour. Gallbladder is not confidently identified on this examination. Pancreas: Unremarkable. No pancreatic ductal dilatation or surrounding inflammatory changes. Spleen: Normal in size without focal abnormality. Adrenals/Urinary Tract: Left adrenal fullness is similar to the exam in 2006. Normal appearance of the right adrenal gland. Negative for hydronephrosis. Small calcification in the central aspect of the left kidney could be vascular versus a nonobstructive stone. Fluid in the urinary bladder. There is a ventral hernia in the lower pelvis in the suprapubic region. The bladder is herniating through this ventral hernia. Stomach/Bowel: Diverticula in sigmoid colon. Focal bowel wall thickening in the sigmoid colon on sequence 2 image 88. Mildly dilated loops of small bowel. Normal appearance of the stomach. Vascular/Lymphatic: Aorta and iliac arteries are heavily calcified. Negative for an aortic aneurysm. Difficult to exclude a prominent lymph nodes in the gastrohepatic ligament. Few prominent lymph nodes in the aortocaval region on sequence 3, image 57 and 59 that measure up to 1.1 cm. There is no significant inguinal lymphadenopathy. Reproductive: Calcifications associated the uterus. No evidence for a large adnexal mass. Other: Large volume of ascites in the abdomen and pelvis. Musculoskeletal: Diffuse subcutaneous edema. Disc space narrowing at L4-L5 and L5-S1. Left-sided pars defect at L4 without anterolisthesis of L4-L5. Degenerative facet disease in lower lumbar spine. No suspicious osseous lesion. IMPRESSION: 1. Airspace disease and consolidation in the right lower lobe with trace right pleural fluid. There are additional foci of ground-glass opacity or airspace  disease in the upper lungs. Findings are suggestive for pneumonia. However, there is also concern for subcarinal lymphadenopathy. Underlying neoplastic process cannot be excluded. Recommend short-term follow-up with intravascular contrast. 2. Concern for scattered hypodense liver lesions and neoplastic disease. Evaluation of the liver is limited on this examination. Recommend further characterization with contrast enhanced CT or MRI. Alternatively, right upper  quadrant ultrasound could be performed to evaluate the liver. 3. Large volume of ascites in the abdomen and pelvis. 4. Dilated loops of small bowel that may be related to the large volume ascites. 5. Tiny left renal calcification. This could be vascular versus small nonobstructive stone. 6. Ventral hernia in the lower pelvis containing portion of the urinary bladder. 7. Colonic diverticulosis. Focal wall thickening in the sigmoid colon is nonspecific. Findings could be associated with diverticulosis but neoplastic process cannot be excluded. These results will be called to the ordering clinician or representative by the Radiologist Assistant, and communication documented in the PACS or [employer]. Electronically Signed   By: Markus Daft M.D.   On: 04/05/2021 10:21

## 2021-04-05 NOTE — Progress Notes (Signed)
Pt refuses to wear boots.

## 2021-04-05 NOTE — Consult Note (Addendum)
Consultation  Referring Provider:    Dr. Candiss Norse Primary Care Physician:  Hoyt Koch, MD Primary Gastroenterologist: Dr. Carlean Purl       Reason for Consultation:   Cirrhosis, abnormal CT of the abdomen, weight loss         HPI:   Tracy Hammond is a 80 y.o. female with a past medical history significant for hypertension, type 2 diabetes and others listed below, who presented to the hospital on 04/07/2021 after a fall at home.  We are consulted in regards to finding of cirrhosis and possible hepatocellular carcinoma on CT.    Initial H&P states that daughter thinks patient fell because of severe bilateral lower extremity edema.  Also describes significant amount of weight loss due to poor appetite and oral intake after the death of her husband in 11/04/22.  Apparently had been on Lasix for 4 months but continued to have bilateral lower extremity edema.    Today, the patient is seen with her daughter by her bedside.  The daughter provides most of the history.  Explains that the patient has been battling diarrhea and sometimes fecal incontinence anytime that she eats or drinks throughout the day, this has been going on for at least 6 months and seem to worsen after her husband passed away in November 04, 2022.  Patient has been using a mixture of Imodium and Pepto which only helped minimally, apparently had an accident yesterday in the hospital while sitting in her bedside chair.      Along with this daughter describes that her health has just declined since her husband passed away and she has lost weight, at least 50 pounds over the past year or so with much of that being in the past 6 months.  Tells me she eats "only enough for a bird".        Apparently also has been having trouble getting in and out of her bathtub so has not been washing herself on a regular basis.  2 weeks ago the daughter gave her a shower at her house and did not notice any leg swelling, but after recent fall daughter looked at her  legs and saw a lot of swelling and blisters which were new over the past couple of weeks.    Denies fever, chills, history of liver disease or alcohol abuse.  Hospital course: Abdominal ultrasound suggests diagnose cirrhosis, AKI improving, CT without contrast suggest possible liver cancer  ED course: 3+ pitting edema in lower extremities along with blisters diffusely, electrolyte disturbances hypokalemia 2.9, creatinine 1.47 (baseline 6 weeks ago 0.78)  GI history: 02/05/2020 colonoscopy Dr. Carlean Purl - Superficial skin breakdown found on perianal exam. - Diverticulosis in the sigmoid colon, in the descending colon, in the transverse colon and in the ascending colon. - The examination was otherwise normal on direct and retroflexion views. - No specimens collected. - Personal history of colonic polyps.  10/06/2016 colonoscopy Dr. Carlean Purl - Two 4 to 6 mm polyps in the descending colon and in the transverse colon, removed with a cold snare. Resected and retrieved. - One 2 mm polyp in the ascending colon, removed with a cold biopsy forceps. Resected and retrieved. - Diverticulosis in the sigmoid colon. - The examination was otherwise normal on direct and retroflexion views. - Personal history of colonic polyps. Adenomas one > 10 mm late 04-Nov-2012 Pathology with adenomas, recall recommended in 11/05/2019  Past Medical History:  Diagnosis Date   Allergy    seasonal   Arthritis  fingers, back   Bursitis    Cancer (HCC)    skin cancer arm and face   Cataract    surgery   Cough    smokers   Diabetes mellitus    GERD (gastroesophageal reflux disease)    Glaucoma    Hyperlipidemia    no meds currently 01/22/20   Hypertension    Personal history of colonic adenoma 06/12/2008   Restless leg    Thyroid disease    young adult-no meds now    Past Surgical History:  Procedure Laterality Date   Bladder Tact     CATARACT EXTRACTION  09-07-2012   rt eye   CATARACT EXTRACTION Right 08/2012    CHOLECYSTECTOMY     COLONOSCOPY     EYE SURGERY     LUMBAR LAMINECTOMY/DECOMPRESSION MICRODISCECTOMY N/A 08/12/2020   Procedure: LUMBAR FOUR-FIVE DECOMPRESSION, LEFT LUMBAR FIVE-SACRAL ONE MICRODISCECTOMY;  Surgeon: Marybelle Killings, MD;  Location: [place];  Service: Orthopedics;  Laterality: N/A;   SKIN CANCER EXCISION     TONSILLECTOMY AND ADENOIDECTOMY     TUBAL LIGATION      Family History  Problem Relation Age of Onset   Hypertension Father        family hx   Diabetes Mother    Kidney failure Mother    Heart disease Mother    Stroke Sister    Other Sister        Leg Disease   Heart disease Son    Colon cancer Neg Hx    Esophageal cancer Neg Hx    Rectal cancer Neg Hx    Stomach cancer Neg Hx     Social History   Tobacco Use   Smoking status: Every Day    Packs/day: 0.50    Years: 60.00    Pack years: 30.00    Types: Cigarettes   Smokeless tobacco: Never  Vaping Use   Vaping Use: Never used  Substance Use Topics   Alcohol use: No   Drug use: No    Prior to Admission medications   Medication Sig Start Date End Date Taking? Authorizing Provider  aspirin 81 MG tablet Take 1 tablet (81 mg total) by mouth every morning. STOP TODAY 11/25, RESTART 08/08/2013 Patient taking differently: Take 81 mg by mouth every morning. 07/25/13  Yes Gatha Mayer, MD  bismuth subsalicylate (PEPTO BISMOL) 262 MG/15ML suspension Take 30 mLs by mouth every 6 (six) hours as needed for diarrhea or loose stools.   Yes [provider]  blood glucose meter kit and supplies KIT Dispense based on patient and insurance preference. Use daily as directed. 01/24/21  Yes Hoyt Koch, MD  brimonidine (ALPHAGAN) 0.15 % ophthalmic solution Place 1 drop into both eyes in the morning and at bedtime. 01/15/15  Yes [provider]  busPIRone (BUSPAR) 5 MG tablet TAKE 1 TABLET BY MOUTH ONCE DAILY AS NEEDED FOR ANXIETY 03/14/21  Yes Hoyt Koch, MD  Calcium Carbonate-Vit D-Min  (CALCIUM 1200) 1200-1000 MG-UNIT CHEW Chew 1 tablet by mouth daily.   Yes [provider]  diphenoxylate-atropine (LOMOTIL) 2.5-0.025 MG tablet Take 1 tablet by mouth 4 (four) times daily as needed for diarrhea or loose stools. 02/27/21  Yes Hoyt Koch, MD  dorzolamide-timolol (COSOPT) 22.3-6.8 MG/ML ophthalmic solution Place 1 drop into both eyes 2 (two) times daily. 01/08/20  Yes [provider]  furosemide (LASIX) 20 MG tablet Take 1 tablet (20 mg total) by mouth daily. 12/19/20  Yes Pricilla Holm  A, MD  glipiZIDE (GLUCOTROL) 5 MG tablet TAKE 1 TABLET BY MOUTH BEFORE BREAKFAST Patient taking differently: Take 5 mg by mouth daily before breakfast. 02/28/21  Yes Hoyt Koch, MD  lisinopril-hydrochlorothiazide (ZESTORETIC) 20-12.5 MG tablet Take 1 tablet by mouth daily. 01/13/21  Yes Hoyt Koch, MD  Loperamide HCl (IMODIUM PO) Take 1 tablet by mouth daily as needed (diarrhea).   Yes [provider]  MAGNESIUM PO Take 500 mg by mouth daily.   Yes [provider]  mirtazapine (REMERON) 15 MG tablet Take 1 tablet (15 mg total) by mouth at bedtime. 02/04/21  Yes Hoyt Koch, MD  Multiple Vitamin (MULTIVITAMIN WITH MINERALS) TABS Take 1 tablet by mouth every morning.   Yes [provider]  [facility] VERIO test strip TAKE AS DIRECTED 02/21/21  Yes Hoyt Koch, MD  potassium chloride SA (KLOR-CON) 20 MEQ tablet Take 1 tablet (20 mEq total) by mouth daily. 12/19/20  Yes Hoyt Koch, MD  tiZANidine (ZANAFLEX) 2 MG tablet TAKE 1 TABLET BY MOUTH ONCE DAILY AT BEDTIME 03/14/21  Yes Hoyt Koch, MD  traZODone (DESYREL) 50 MG tablet Take 1-2 tablets (50-100 mg total) by mouth at bedtime as needed for sleep. 12/19/20  Yes Hoyt Koch, MD  rOPINIRole (REQUIP) 2 MG tablet Take 1 tablet (2 mg total) by mouth daily. Patient not taking: No sig reported 09/25/19 04/03/21  Hoyt Koch, MD     Current Facility-Administered Medications  Medication Dose Route Frequency Provider Last Rate Last Admin   (feeding supplement) PROSource Plus liquid 30 mL  30 mL Oral BID BM Thurnell Lose, MD   30 mL at 04/05/21 1107   acetaminophen (TYLENOL) tablet 650 mg  650 mg Oral Q6H PRN Zierle-Ghosh, Asia B, DO       albuterol (PROVENTIL) (2.5 MG/3ML) 0.083% nebulizer solution 2.5 mg  2.5 mg Nebulization Q6H PRN Thurnell Lose, MD       aspirin EC tablet 81 mg  81 mg Oral q morning Irene Pap N, DO   81 mg at 04/05/21 1117   brimonidine (ALPHAGAN) 0.2 % ophthalmic solution 1 drop  1 drop Both Eyes Q8H Hall, Carole N, DO   1 drop at 04/05/21 0528   carvedilol (COREG) tablet 6.25 mg  6.25 mg Oral BID WC Thurnell Lose, MD   6.25 mg at 04/05/21 1117   dorzolamide-timolol (COSOPT) 22.3-6.8 MG/ML ophthalmic solution 1 drop  1 drop Both Eyes BID Irene Pap N, DO   1 drop at 04/05/21 1112   enoxaparin (LOVENOX) injection 30 mg  30 mg Subcutaneous Q24H Hall, Carole N, DO   30 mg at 04/05/21 1110   feeding supplement (ENSURE ENLIVE / ENSURE PLUS) liquid 237 mL  237 mL Oral BID BM Thurnell Lose, MD   Given at 04/05/21 1114   furosemide (LASIX) tablet 60 mg  60 mg Oral Daily Lala Lund K, MD   60 mg at 04/05/21 1109   insulin aspart (novoLOG) injection 0-9 Units  0-9 Units Subcutaneous TID WC Thurnell Lose, MD   2 Units at 04/05/21 1136   lactose free nutrition (BOOST PLUS) liquid 237 mL  237 mL Oral TID WC Thurnell Lose, MD   Given at 04/05/21 1114   LORazepam (ATIVAN) injection 0.25 mg  0.25 mg Intravenous Once PRN Thurnell Lose, MD       mirtazapine (REMERON) tablet 15 mg  15 mg Oral QHS Kayleen Memos, DO  15 mg at 04/04/21 2147   multivitamin with minerals tablet 1 tablet  1 tablet Oral q morning Kayleen Memos, DO   1 tablet at 04/05/21 1117   potassium chloride SA (KLOR-CON) CR tablet 40 mEq  40 mEq Oral Daily Thurnell Lose, MD   40 mEq at 04/05/21 1109    spironolactone (ALDACTONE) tablet 25 mg  25 mg Oral Daily Thurnell Lose, MD   25 mg at 04/05/21 1109   tamsulosin (FLOMAX) capsule 0.4 mg  0.4 mg Oral Daily Thurnell Lose, MD        Allergies as of 04/15/2021   (No Known Allergies)     Review of Systems:    Constitutional: No fever or chills Skin: No rash  Cardiovascular: No chest pain Respiratory: No SOB  Gastrointestinal: See HPI and otherwise negative Genitourinary: No dysuria Neurological: +fall Musculoskeletal: No new muscle or joint pain Hematologic: No bleeding  Psychiatric: No history of depression or anxiety    Physical Exam:  Vital signs in last 24 hours: Temp:  [97.7 F (36.5 C)-98.6 F (37 C)] 97.7 F (36.5 C) (08/06 1105) Pulse Rate:  [71-79] 71 (08/06 0431) Resp:  [17-21] 21 (08/06 1105) BP: (116-141)/(56-62) 120/60 (08/06 1105) SpO2:  [90 %-100 %] 100 % (08/06 1105) Last BM Date: 04/04/21 General:   Pleasant thin appearing, chronically ill elderly Caucasian female appears to be in NAD, Well developed, alert and cooperative Head:  Normocephalic and atraumatic. Eyes:   PEERL, EOMI. No icterus. Conjunctiva pink. Ears: HOH Neck:  Supple Throat: Oral cavity and pharynx without inflammation, swelling or lesion. Lungs: Respirations even and unlabored. Lungs clear to auscultation bilaterally.   No wheezes, crackles, or rhonchi.  Heart: Normal S1, S2. No MRG. Regular rate and rhythm. + 2+ bilateral peripheral edema to the level of the knee, much of the leg is bandaged, tender to palpation Abdomen:  Soft, nondistended, nontender. No rebound or guarding. Normal bowel sounds. No appreciable masses or hepatomegaly. Rectal:  Not performed.  Msk:  Symmetrical without gross deformities. Peripheral pulses intact.  Extremities:  Without edema, no deformity or joint abnormality.  Neurologic:  Alert and  oriented x4;  grossly normal neurologically.  Skin:   Ulcers on legs with clean bandage as above Psychiatric:  Demonstrates good judgement and reason without abnormal affect or behaviors.   LAB RESULTS: Recent Labs    04/04/21 0034 04/05/21 0205  WBC 10.2 8.7  HGB 12.5 12.8  HCT 37.9 38.5  PLT 181 144*   BMET Recent Labs    04/04/21 0034 04/04/21 1645 04/05/21 0205  NA 140  --  141  K 2.8* 3.3* 3.7  CL 102  --  104  CO2 27  --  28  GLUCOSE 150*  --  254*  BUN 29*  --  32*  CREATININE 0.87  --  0.83  CALCIUM 8.4*  --  8.7*   LFT Recent Labs    04/03/21 1042 04/04/21 0034 04/05/21 0205  PROT 6.8   < > 5.0*  ALBUMIN 3.2*   < > 2.4*  AST 62*   < > 52*  ALT 48*   < > 44  ALKPHOS 321*   < > 257*  BILITOT 2.1*   < > 1.2  BILIDIR 0.7*  --   --   IBILI 1.4*  --   --    < > = values in this interval not displayed.   PT/INR Recent Labs    04/03/21 1042  LABPROT 13.6  INR 1.0    STUDIES: US Abdomen Complete  Result Date: 04/03/2021 CLINICAL DATA:  Ascites and weight loss EXAM: ABDOMEN ULTRASOUND COMPLETE COMPARISON:  None. FINDINGS: Gallbladder: Surgically absent Common bile duct: Diameter: 6 mm Liver: Heterogeneous hepatic echotexture with areas of increased echogenicity throughout the parenchyma but no well-defined mass. Nodular hepatic contour consistent with cirrhosis. Portal vein is patent on color Doppler imaging with normal direction of blood flow towards the liver. IVC: No abnormality visualized. Pancreas: Not seen due to bowel gas Spleen: Size and appearance within normal limits. Right Kidney: Length: 10.4 cm. Echogenicity within normal limits. No mass or hydronephrosis visualized. Left Kidney: Length: 9.1 cm. Echogenicity within normal limits. No mass or hydronephrosis visualized. Abdominal aorta: No aneurysm visualized. Other findings: Moderate ascites in the abdomen. IMPRESSION: 1. Liver cirrhosis. Heterogeneous hepatic echotexture with areas of increased echogenicity but without well-defined mass by sonography. 2. Moderate ascites 3. Status post cholecystectomy  Electronically Signed   By: Donavan Foil M.D.   On: 04/03/2021 15:40   ECHOCARDIOGRAM COMPLETE  Result Date: 04/04/2021    ECHOCARDIOGRAM REPORT   Patient Name:   FLEETA KUNDE Date of Exam: 04/04/2021 Medical Rec #:  [id number]      Height:       61.0 in Accession #:    [id number]     Weight:       128.0 lb Date of Birth:  [birth year]      BSA:          1.562 m Patient Age:    89 years       BP:           122/88 mmHg Patient Gender: F              HR:           76 bpm. Exam Location:  Inpatient Procedure: 2D Echo, Color Doppler and Cardiac Doppler Indications:    CHF  History:        Patient has prior history of Echocardiogram examinations, most                 recent 04/07/2018. Risk Factors:Diabetes, Dyslipidemia and                 Hypertension.  Sonographer:    Bernadene Person RDCS Referring Phys: Arnell Asal Margaree Mackintosh [place]  1. Left ventricular ejection fraction, by estimation, is 70 to 75%. The left ventricle has hyperdynamic function. The left ventricle has no regional wall motion abnormalities. Left ventricular diastolic parameters were normal.  2. Right ventricular systolic function is moderately reduced. The right ventricular size is moderately enlarged. There is normal pulmonary artery systolic pressure.  3. The mitral valve is grossly normal. Trivial mitral valve regurgitation.  4. The aortic valve is tricuspid. There is mild calcification of the aortic valve. Aortic valve regurgitation is not visualized. FINDINGS  Left Ventricle: Left ventricular ejection fraction, by estimation, is 70 to 75%. The left ventricle has hyperdynamic function. The left ventricle has no regional wall motion abnormalities. The left ventricular internal cavity size was normal in size. There is no left ventricular hypertrophy. Left ventricular diastolic parameters were normal. Right Ventricle: The right ventricular size is moderately enlarged. Right vetricular wall thickness was not well visualized. Right ventricular  systolic function is moderately reduced. There is normal pulmonary artery systolic pressure. The tricuspid regurgitant velocity is 2.44 m/s, and with an assumed right atrial pressure of 3 mmHg, the estimated right ventricular systolic  pressure is 26.8 mmHg. Left Atrium: Left atrial size was normal in size. Right Atrium: Right atrial size was normal in size. Pericardium: There is no evidence of pericardial effusion. Mitral Valve: The mitral valve is grossly normal. Mild to moderate mitral annular calcification. Trivial mitral valve regurgitation. Tricuspid Valve: The tricuspid valve is grossly normal. Tricuspid valve regurgitation is trivial. Aortic Valve: The aortic valve is tricuspid. There is mild calcification of the aortic valve. Aortic valve regurgitation is not visualized. Pulmonic Valve: The pulmonic valve was not well visualized. Pulmonic valve regurgitation is not visualized. Aorta: The aortic root and ascending aorta are structurally normal, with no evidence of dilitation. IAS/Shunts: The atrial septum is grossly normal.  LEFT VENTRICLE PLAX 2D LVIDd:         2.60 cm  Diastology LVIDs:         1.70 cm  LV e' medial:    7.28 cm/s LV PW:         1.00 cm  LV E/e' medial:  10.8 LV IVS:        1.00 cm  LV e' lateral:   8.63 cm/s LVOT diam:     1.70 cm  LV E/e' lateral: 9.1 LV SV:         45 LV SV Index:   29 LVOT Area:     2.27 cm  RIGHT VENTRICLE RV S prime:     11.10 cm/s TAPSE (M-mode): 1.3 cm LEFT ATRIUM             Index       RIGHT ATRIUM           Index LA diam:        3.50 cm 2.24 cm/m  RA Area:     11.50 cm LA Vol (A2C):   35.1 ml 22.46 ml/m RA Volume:   22.00 ml  14.08 ml/m LA Vol (A4C):   30.1 ml 19.23 ml/m LA Biplane Vol: 35.6 ml 22.78 ml/m  AORTIC VALVE LVOT Vmax:   108.00 cm/s LVOT Vmean:  68.600 cm/s LVOT VTI:    0.199 m  AORTA Ao Root diam: 2.90 cm Ao Asc diam:  3.10 cm MITRAL VALVE                TRICUSPID VALVE MV Area (PHT): 1.76 cm     TR Peak grad:   23.8 mmHg MV Decel Time: 430  msec     TR Vmax:        244.00 cm/s MV E velocity: 78.30 cm/s MV A velocity: 130.00 cm/s  SHUNTS MV E/A ratio:  0.60         Systemic VTI:  0.20 m                             Systemic Diam: 1.70 cm Mertie Moores MD Electronically signed by Mertie Moores MD Signature Date/Time: 04/04/2021/12:27:54 PM    Final    CT CHEST ABDOMEN PELVIS WO CONTRAST  Result Date: 04/05/2021 CLINICAL DATA:  Pneumonia.  Severe peripheral edema. EXAM: CT CHEST, ABDOMEN AND PELVIS WITHOUT CONTRAST TECHNIQUE: Multidetector CT imaging of the chest, abdomen and pelvis was performed following the standard protocol without IV contrast. COMPARISON:  10/25/2004 FINDINGS: CT CHEST FINDINGS Cardiovascular: Prominent mitral annular calcifications. Diffuse coronary artery calcifications. Normal caliber of the thoracic aorta. Atherosclerotic calcifications involving the aortic arch and descending thoracic aorta. Mediastinum/Nodes: Calcified right thyroid nodule. Concern for subcarinal lymphadenopathy on sequence 3, image  18 measuring up to 2.1 cm. This represents a significant change since 2006. Overall, limited evaluation for lymphadenopathy due to the lack of intravascular contrast. No evidence for axillary lymphadenopathy. No significant esophageal dilatation. Lungs/Pleura: There are 2 foci of airspace disease or ground-glass opacities in the right upper lung. At least 1 small focus of ground-glass opacity in the medial left upper lung on sequence 4 image 15. Few densities in left lower lobe are suggestive for atelectasis. Central consolidation in the right lower lobe on sequence 4, image 58. Additional branching and patchy opacities in the periphery of the right lower lobe. Trace right pleural fluid. Musculoskeletal: No suspicious osseous lesions in the chest. CT ABDOMEN PELVIS FINDINGS Hepatobiliary: Perihepatic ascites. Significant artifact on this examination but there is still concern for several hypodense hepatic lesions. Possible lesion in  the right hepatic lobe on sequence 3, image 50 measuring roughly 2.9 cm. Question a lesion at the posterior right hepatic dome on sequence 3, image 32. Liver may have a slightly nodular contour. Gallbladder is not confidently identified on this examination. Pancreas: Unremarkable. No pancreatic ductal dilatation or surrounding inflammatory changes. Spleen: Normal in size without focal abnormality. Adrenals/Urinary Tract: Left adrenal fullness is similar to the exam in 2006. Normal appearance of the right adrenal gland. Negative for hydronephrosis. Small calcification in the central aspect of the left kidney could be vascular versus a nonobstructive stone. Fluid in the urinary bladder. There is a ventral hernia in the lower pelvis in the suprapubic region. The bladder is herniating through this ventral hernia. Stomach/Bowel: Diverticula in sigmoid colon. Focal bowel wall thickening in the sigmoid colon on sequence 2 image 88. Mildly dilated loops of small bowel. Normal appearance of the stomach. Vascular/Lymphatic: Aorta and iliac arteries are heavily calcified. Negative for an aortic aneurysm. Difficult to exclude a prominent lymph nodes in the gastrohepatic ligament. Few prominent lymph nodes in the aortocaval region on sequence 3, image 57 and 59 that measure up to 1.1 cm. There is no significant inguinal lymphadenopathy. Reproductive: Calcifications associated the uterus. No evidence for a large adnexal mass. Other: Large volume of ascites in the abdomen and pelvis. Musculoskeletal: Diffuse subcutaneous edema. Disc space narrowing at L4-L5 and L5-S1. Left-sided pars defect at L4 without anterolisthesis of L4-L5. Degenerative facet disease in lower lumbar spine. No suspicious osseous lesion. IMPRESSION: 1. Airspace disease and consolidation in the right lower lobe with trace right pleural fluid. There are additional foci of ground-glass opacity or airspace disease in the upper lungs. Findings are suggestive for  pneumonia. However, there is also concern for subcarinal lymphadenopathy. Underlying neoplastic process cannot be excluded. Recommend short-term follow-up with intravascular contrast. 2. Concern for scattered hypodense liver lesions and neoplastic disease. Evaluation of the liver is limited on this examination. Recommend further characterization with contrast enhanced CT or MRI. Alternatively, right upper quadrant ultrasound could be performed to evaluate the liver. 3. Large volume of ascites in the abdomen and pelvis. 4. Dilated loops of small bowel that may be related to the large volume ascites. 5. Tiny left renal calcification. This could be vascular versus small nonobstructive stone. 6. Ventral hernia in the lower pelvis containing portion of the urinary bladder. 7. Colonic diverticulosis. Focal wall thickening in the sigmoid colon is nonspecific. Findings could be associated with diverticulosis but neoplastic process cannot be excluded. These results will be called to the ordering clinician or representative by the Radiologist Assistant, and communication documented in the PACS or [employer]. Electronically Signed   By: Quita Skye  Anselm Pancoast M.D.   On: 04/05/2021 10:21      Impression / Plan:   Impression: 1.  Severe peripheral edema with AKI: Also with mild ascites, ultrasound suggesting cirrhosis, albumin is low but replaced 2.  Abnormal CT of the abdomen: Showing airspace disease and consolidation in the right lower lobe with some concern for pneumonia versus subcarinal lymphadenopathy, also concern for scattered hypodense liver lesions in neoplastic disease though [place] liver was limited (recommended contrasted CT/MRI), large volume ascites in the abdomen pelvis, dilated loops of small bowel, ventral hernia in the lower pelvis containing portion of the urinary bladder, colonic diverticulosis and focal wall thickening of the sigmoid colon which is nonspecific 3.  Weight loss: 50 pounds over the past  year, much of that within the past 6 months per daughter; decreased appetite, diarrhea, depression and possible liver cancer all contributing 4.  GERD 5.  Deconditioning 6.  Hypokalemia: Replaced 7.  Recurrent bladder outlet obstruction: Status post I&O x2  Plan: 1.  Agree with dedicated CT of the liver with IV contrast 2.  Pending above may need to add further liver serologies to determine etiology of cirrhosis 3.  Given ascites, paracentesis with fluid for further analysis is recommended 4.  Continue Lasix 60 mg daily and Aldactone 25 mg daily for now 5.  Pending results from paracentesis may need to consider possible liver biopsy in the future 6.  Agree with consultation to palliative care to decide on goals of care 7.  Ordered hepatitis C antibody and AFP today  Thank you for your kind consultation, we will continue to follow.  Lavone Nian Johanny Segers  04/05/2021, 11:55 AM

## 2021-04-06 ENCOUNTER — Inpatient Hospital Stay (HOSPITAL_COMMUNITY): Payer: Medicare Other

## 2021-04-06 ENCOUNTER — Other Ambulatory Visit: Payer: Self-pay | Admitting: Student

## 2021-04-06 ENCOUNTER — Observation Stay (HOSPITAL_COMMUNITY): Payer: Medicare Other

## 2021-04-06 DIAGNOSIS — Z6824 Body mass index (BMI) 24.0-24.9, adult: Secondary | ICD-10-CM | POA: Diagnosis not present

## 2021-04-06 DIAGNOSIS — Z85828 Personal history of other malignant neoplasm of skin: Secondary | ICD-10-CM | POA: Diagnosis not present

## 2021-04-06 DIAGNOSIS — K766 Portal hypertension: Secondary | ICD-10-CM | POA: Diagnosis present

## 2021-04-06 DIAGNOSIS — Z515 Encounter for palliative care: Secondary | ICD-10-CM

## 2021-04-06 DIAGNOSIS — J811 Chronic pulmonary edema: Secondary | ICD-10-CM | POA: Diagnosis present

## 2021-04-06 DIAGNOSIS — K5939 Other megacolon: Secondary | ICD-10-CM | POA: Diagnosis not present

## 2021-04-06 DIAGNOSIS — R188 Other ascites: Secondary | ICD-10-CM

## 2021-04-06 DIAGNOSIS — C220 Liver cell carcinoma: Secondary | ICD-10-CM | POA: Diagnosis present

## 2021-04-06 DIAGNOSIS — Z66 Do not resuscitate: Secondary | ICD-10-CM | POA: Diagnosis not present

## 2021-04-06 DIAGNOSIS — J90 Pleural effusion, not elsewhere classified: Secondary | ICD-10-CM | POA: Diagnosis not present

## 2021-04-06 DIAGNOSIS — E876 Hypokalemia: Secondary | ICD-10-CM | POA: Diagnosis present

## 2021-04-06 DIAGNOSIS — R627 Adult failure to thrive: Secondary | ICD-10-CM | POA: Diagnosis present

## 2021-04-06 DIAGNOSIS — F4320 Adjustment disorder, unspecified: Secondary | ICD-10-CM | POA: Diagnosis present

## 2021-04-06 DIAGNOSIS — I110 Hypertensive heart disease with heart failure: Secondary | ICD-10-CM | POA: Diagnosis present

## 2021-04-06 DIAGNOSIS — K6389 Other specified diseases of intestine: Secondary | ICD-10-CM | POA: Diagnosis not present

## 2021-04-06 DIAGNOSIS — N179 Acute kidney failure, unspecified: Secondary | ICD-10-CM | POA: Diagnosis present

## 2021-04-06 DIAGNOSIS — I468 Cardiac arrest due to other underlying condition: Secondary | ICD-10-CM | POA: Diagnosis not present

## 2021-04-06 DIAGNOSIS — K7460 Unspecified cirrhosis of liver: Secondary | ICD-10-CM | POA: Diagnosis present

## 2021-04-06 DIAGNOSIS — R609 Edema, unspecified: Secondary | ICD-10-CM | POA: Diagnosis not present

## 2021-04-06 DIAGNOSIS — E1165 Type 2 diabetes mellitus with hyperglycemia: Secondary | ICD-10-CM | POA: Diagnosis present

## 2021-04-06 DIAGNOSIS — J9811 Atelectasis: Secondary | ICD-10-CM | POA: Diagnosis not present

## 2021-04-06 DIAGNOSIS — I700 Atherosclerosis of aorta: Secondary | ICD-10-CM | POA: Diagnosis not present

## 2021-04-06 DIAGNOSIS — K567 Ileus, unspecified: Secondary | ICD-10-CM | POA: Diagnosis not present

## 2021-04-06 DIAGNOSIS — N320 Bladder-neck obstruction: Secondary | ICD-10-CM | POA: Diagnosis present

## 2021-04-06 DIAGNOSIS — R54 Age-related physical debility: Secondary | ICD-10-CM | POA: Diagnosis present

## 2021-04-06 DIAGNOSIS — Z20822 Contact with and (suspected) exposure to covid-19: Secondary | ICD-10-CM | POA: Diagnosis present

## 2021-04-06 DIAGNOSIS — K219 Gastro-esophageal reflux disease without esophagitis: Secondary | ICD-10-CM | POA: Diagnosis present

## 2021-04-06 DIAGNOSIS — W19XXXA Unspecified fall, initial encounter: Secondary | ICD-10-CM | POA: Diagnosis present

## 2021-04-06 DIAGNOSIS — K3189 Other diseases of stomach and duodenum: Secondary | ICD-10-CM | POA: Diagnosis not present

## 2021-04-06 DIAGNOSIS — F32A Depression, unspecified: Secondary | ICD-10-CM | POA: Diagnosis present

## 2021-04-06 DIAGNOSIS — G2581 Restless legs syndrome: Secondary | ICD-10-CM | POA: Diagnosis present

## 2021-04-06 DIAGNOSIS — R64 Cachexia: Secondary | ICD-10-CM | POA: Diagnosis present

## 2021-04-06 DIAGNOSIS — I701 Atherosclerosis of renal artery: Secondary | ICD-10-CM | POA: Diagnosis not present

## 2021-04-06 DIAGNOSIS — Y92009 Unspecified place in unspecified non-institutional (private) residence as the place of occurrence of the external cause: Secondary | ICD-10-CM | POA: Diagnosis not present

## 2021-04-06 LAB — BODY FLUID CELL COUNT WITH DIFFERENTIAL
Eos, Fluid: 0 %
Lymphs, Fluid: 59 %
Monocyte-Macrophage-Serous Fluid: 30 % — ABNORMAL LOW (ref 50–90)
Neutrophil Count, Fluid: 11 % (ref 0–25)
Total Nucleated Cell Count, Fluid: 86 cu mm (ref 0–1000)

## 2021-04-06 LAB — CBC WITH DIFFERENTIAL/PLATELET
Abs Immature Granulocytes: 0.03 10*3/uL (ref 0.00–0.07)
Basophils Absolute: 0 10*3/uL (ref 0.0–0.1)
Basophils Relative: 0 %
Eosinophils Absolute: 0 10*3/uL (ref 0.0–0.5)
Eosinophils Relative: 0 %
HCT: 44.3 % (ref 36.0–46.0)
Hemoglobin: 14.7 g/dL (ref 12.0–15.0)
Immature Granulocytes: 0 %
Lymphocytes Relative: 10 %
Lymphs Abs: 0.8 10*3/uL (ref 0.7–4.0)
MCH: 31 pg (ref 26.0–34.0)
MCHC: 33.2 g/dL (ref 30.0–36.0)
MCV: 93.5 fL (ref 80.0–100.0)
Monocytes Absolute: 0.4 10*3/uL (ref 0.1–1.0)
Monocytes Relative: 5 %
Neutro Abs: 6.1 10*3/uL (ref 1.7–7.7)
Neutrophils Relative %: 85 %
Platelets: 223 10*3/uL (ref 150–400)
RBC: 4.74 MIL/uL (ref 3.87–5.11)
RDW: 15.8 % — ABNORMAL HIGH (ref 11.5–15.5)
WBC: 7.3 10*3/uL (ref 4.0–10.5)
nRBC: 0 % (ref 0.0–0.2)

## 2021-04-06 LAB — PROTEIN, PLEURAL OR PERITONEAL FLUID: Total protein, fluid: <3 g/dL

## 2021-04-06 LAB — GRAM STAIN

## 2021-04-06 LAB — COMPREHENSIVE METABOLIC PANEL
ALT: 47 U/L — ABNORMAL HIGH (ref 0–44)
AST: 44 U/L — ABNORMAL HIGH (ref 15–41)
Albumin: 2.6 g/dL — ABNORMAL LOW (ref 3.5–5.0)
Alkaline Phosphatase: 227 U/L — ABNORMAL HIGH (ref 38–126)
Anion gap: 11 (ref 5–15)
BUN: 33 mg/dL — ABNORMAL HIGH (ref 8–23)
CO2: 34 mmol/L — ABNORMAL HIGH (ref 22–32)
Calcium: 9.4 mg/dL (ref 8.9–10.3)
Chloride: 100 mmol/L (ref 98–111)
Creatinine, Ser: 0.94 mg/dL (ref 0.44–1.00)
GFR, Estimated: >60 mL/min (ref >60)
Glucose, Bld: 250 mg/dL — ABNORMAL HIGH (ref 70–99)
Potassium: 4.7 mmol/L (ref 3.5–5.1)
Sodium: 145 mmol/L (ref 135–145)
Total Bilirubin: 2.1 mg/dL — ABNORMAL HIGH (ref 0.3–1.2)
Total Protein: 5.6 g/dL — ABNORMAL LOW (ref 6.5–8.1)

## 2021-04-06 LAB — ALBUMIN, PLEURAL OR PERITONEAL FLUID: Albumin, Fluid: 1.5 g/dL

## 2021-04-06 LAB — LACTATE DEHYDROGENASE, PLEURAL OR PERITONEAL FLUID: LD, Fluid: 99 U/L — ABNORMAL HIGH (ref 3–23)

## 2021-04-06 LAB — GLUCOSE, CAPILLARY
Glucose-Capillary: 251 mg/dL — ABNORMAL HIGH (ref 70–99)
Glucose-Capillary: 189 mg/dL — ABNORMAL HIGH (ref 70–99)

## 2021-04-06 LAB — BRAIN NATRIURETIC PEPTIDE: B Natriuretic Peptide: 366.6 pg/mL — ABNORMAL HIGH (ref 0.0–100.0)

## 2021-04-06 LAB — MAGNESIUM: Magnesium: 2.2 mg/dL (ref 1.7–2.4)

## 2021-04-06 MED ORDER — ONDANSETRON HCL 4 MG/2ML IJ SOLN
INTRAMUSCULAR | Status: AC
  Filled 2021-04-06: qty 2

## 2021-04-06 MED ORDER — CHLORHEXIDINE GLUCONATE CLOTH 2 % EX PADS
6.0000 | MEDICATED_PAD | Freq: Every day | CUTANEOUS | Status: DC
  Administered 2021-04-06: 6 via TOPICAL

## 2021-04-06 MED ORDER — IOHEXOL 350 MG/ML SOLN
100.0000 mL | Freq: Once | INTRAVENOUS | Status: AC | PRN
  Administered 2021-04-06: 100 mL via INTRAVENOUS

## 2021-04-06 MED ORDER — ONDANSETRON HCL 4 MG/2ML IJ SOLN
4.0000 mg | Freq: Three times a day (TID) | INTRAMUSCULAR | Status: DC | PRN
  Administered 2021-04-06: 4 mg via INTRAVENOUS

## 2021-04-06 MED ORDER — LIDOCAINE HCL (PF) 1 % IJ SOLN
INTRAMUSCULAR | Status: AC
  Filled 2021-04-06: qty 30

## 2021-04-06 MED ORDER — MORPHINE SULFATE (PF) 2 MG/ML IV SOLN
2.0000 mg | Freq: Once | INTRAVENOUS | Status: AC
  Administered 2021-04-06: 2 mg via INTRAVENOUS
  Filled 2021-04-06: qty 1

## 2021-04-06 MED ORDER — MORPHINE SULFATE (PF) 2 MG/ML IV SOLN
1.0000 mg | INTRAVENOUS | Status: DC | PRN
Start: 2021-04-06 — End: 2021-04-06

## 2021-04-06 MED ORDER — LORAZEPAM 2 MG/ML IJ SOLN: 1.0000 mg | Freq: Once | INTRAMUSCULAR | Status: DC

## 2021-04-06 MED ORDER — FUROSEMIDE 40 MG PO TABS: 80.0000 mg | ORAL_TABLET | Freq: Every day | ORAL | Status: DC

## 2021-04-06 MED ORDER — LORAZEPAM 2 MG/ML IJ SOLN
1.0000 mg | Freq: Once | INTRAMUSCULAR | Status: AC | PRN
  Administered 2021-04-06: 1 mg via INTRAVENOUS
  Filled 2021-04-06: qty 1

## 2021-04-07 LAB — CYTOLOGY - NON PAP

## 2021-04-07 LAB — AFP TUMOR MARKER: AFP, Serum, Tumor Marker: 15.3 ng/mL — ABNORMAL HIGH (ref 0.0–9.2)

## 2021-04-11 LAB — CULTURE, BODY FLUID W GRAM STAIN -BOTTLE: Culture: NO GROWTH

## 2021-05-01 NOTE — Progress Notes (Addendum)
PROGRESS NOTE                                                                                                                                                                                                             Patient Demographics:    Tracy Hammond, is a 80 y.o. female, DOB - 1941-05-30, [id number]  Outpatient Primary MD for the patient is Hoyt Koch, MD    LOS - 1  Admit date - 04/18/2021    CC - Edema     Brief Narrative (HPI from H&P) -  Tracy Hammond is a 80 y.o. female with medical history significant for essential hypertension, type 2 diabetes, adjustment disorder, recently lost her husband in February 22 in the hospital who presented from home after a fall, her daughter thinks she fell because of the severe bilateral lower extremity edema, work-up in the ER showed AKI with massive edema and she was admitted for further treatment.  Further work-up suggested that she had underlying likely liver cirrhosis and possible liver malignancy causing portal hypertension, she was extremely frail and cachectic, after discussions with family she was changed to DNR and now comfort measures on April 28, 2021.   Subjective:   Patient in bed more confused, unable to answer questions or follow commands.  Again appears severely weak and cachectic.   Assessment  & Plan :     Severe peripheral edema with AKI - she also has mild ascites, reason is not entirely clear, no previous history of heart liver or renal problems, stable UA, abdominal ultrasound suggests undiagnosed cirrhosis, albumin slightly low placed on appropriate supplementation, stable echocardiogram, has been appropriately diuresed earlier, continue compressive stockings and leg elevation along with fluid restriction.  2.  Unintentional 50 pound weight loss with peripheral edema and ascites.  Work-up so far suggestive of possible liver cirrhosis and malignancy,  hepatitis panel negative, continue to diurese, discussed with daughter, DNR for now as she is extremely frail and cachectic.  Palliative care, GI following, CT scan with contrast of liver and chest pending for prognostic purposes, ascites fluid appears to have SAAG of over 1 suggesting portal hypertension, however as patient is continuing to decline rapidly family wishes comfort measures which will be initiated, she is DNR.  Likely home hospice in 1 to 2 days if she survives.  3.  Possible  small bowel obstruction.  Currently NPO.  Will monitor clinically.  CT soon as above   4. Deconditioning.  PT OT and monitor.  5.  Hypokalemia.  Replaced.    6.  Essential hypertension.   will hold blood pressure medications till she is comfort measures.  7.  Recurrent bladder outlet obstruction.  Did I&O x 2, Flomax and Foley now.    8.  GERD.  PPI.    9 DM Type II.  Sliding scale.  While she is on comfort measures.  Lab Results  Component Value Date   HGBA1C 5.8 (H) 04/03/2021   CBG (last 3)  Recent Labs    04/05/21 2121 2021/04/23 0910 Apr 23, 2021 1128  GLUCAP 260* 251* 189*          Condition - Extremely Guarded  Family Communication  :  daughter in rm 04/03/21, message left [phone] on 04/04/21 at 12.12pm, updated 04/05/2021, 04-23-21  Code Status : DNR  Consults  : GI, palliative care  PUD Prophylaxis : PPI   Procedures  :     CT Head and C Spine  - non acute.  ABD [place] -  1. Liver cirrhosis. Heterogeneous hepatic echotexture with areas of increased echogenicity but without well-defined mass by sonography. 2. Moderate ascites 3. Status post cholecystectomy  TTE -  1. Left ventricular ejection fraction, by estimation, is 70 to 75%. The left ventricle has hyperdynamic function. The left ventricle has no regional wall motion abnormalities. Left ventricular diastolic parameters were normal.  2. Right ventricular systolic function is moderately reduced. The right ventricular size is  moderately enlarged. There is normal pulmonary artery systolic pressure.  3. The mitral valve is grossly normal. Trivial mitral valve regurgitation.  4. The aortic valve is tricuspid. There is mild calcification of the aortic valve. Aortic valve regurgitation is not visualized.  Ultrasound-guided paracentesis with 2 L of peritoneal fluid removed   CT chest abdomen pelvis without contrast. -   CT right upper quadrant liver with contrast -       Disposition Plan  :    Status is: Inpatient  Remains inpatient appropriate because:IV treatments appropriate due to intensity of illness or inability to take PO  Dispo: The patient is from: Home              Anticipated d/c is to: Home              Patient currently is not medically stable to d/c.   Difficult to place patient No  DVT Prophylaxis  :    enoxaparin (LOVENOX) injection 30 mg Start: 04/02/21 1000 SCDs Start: 04/02/21 0549   Lab Results  Component Value Date   PLT 223 04-23-2021    Diet :  Diet Order             Diet NPO time specified Except for: Sips with Meds  Diet effective now                    Inpatient Medications  Scheduled Meds:  (feeding supplement) PROSource Plus  30 mL Oral BID BM   brimonidine  1 drop Both Eyes Q8H   Chlorhexidine Gluconate Cloth  6 each Topical Daily   dorzolamide-timolol  1 drop Both Eyes BID   enoxaparin (LOVENOX) injection  30 mg Subcutaneous Q24H   lidocaine (PF)       ondansetron       tamsulosin  0.4 mg Oral Daily   Continuous Infusions: PRN Meds:.  Antibiotics  :  Anti-infectives (From admission, onward)    None        Time Spent in minutes  30   Lala Lund M.D on April 27, 2021 at 11:34 AM  To page go to [email or web address]   [facility] -  Office  [phone]  See all Orders from today for further details    Objective:   Vitals:   04/05/21 2000 04-27-21 0945 04-27-2021 1000 04-27-21 1112  BP: (!) 112/95 137/72 120/60 103/81  Pulse: 89   70   Resp:    17  Temp: (!) 97.3 F (36.3 C)   (!) 97.3 F (36.3 C)  TempSrc: Axillary   Axillary  SpO2: 99%     Weight:      Height:        Wt Readings from Last 3 Encounters:  04/21/2021 58.1 kg  02/27/21 57.6 kg  02/04/21 58.4 kg     Intake/Output Summary (Last 24 hours) at 27-Apr-2021 1134 Last data filed at April 27, 2021 0500 Gross per 24 hour  Intake 14.94 ml  Output 604 ml  Net -589.06 ml     Physical Exam  She today is confused, appears to be in mild distress, extremely frail and cachectic Tracy Hammond.AT,PERRAL Supple Neck,No JVD, No cervical lymphadenopathy appriciated.  Symmetrical Chest wall movement, Good air movement bilaterally, CTAB RRR,No Gallops, Rubs or new Murmurs, No Parasternal Heave +ve B.Sounds, abdomen is mildly distended,. No Cyanosis, Clubbing or edema, No new Rash or bruise     Pressure Injury 04/05/21 Buttocks Other (Comment);Medial pink/red (Active)  04/05/21 0300  Location: Buttocks  Location Orientation: Other (Comment);Medial (inner)  Staging:   Wound Description (Comments): pink/red  Present on Admission:      Data Review:    CBC Recent Labs  Lab 04/19/2021 2357 04/02/21 1035 04/04/21 0034 04/05/21 0205 2021/04/27 0334  WBC 10.9* 15.7* 10.2 8.7 7.3  HGB 15.0 16.2* 12.5 12.8 14.7  HCT 44.6 49.1* 37.9 38.5 44.3  PLT 150 204 181 144* 223  MCV 92.0 91.9 90.7 91.7 93.5  MCH 30.9 30.3 29.9 30.5 31.0  MCHC 33.6 33.0 33.0 33.2 33.2  RDW 15.2 15.4 15.3 15.6* 15.8*  LYMPHSABS 1.4  --  1.1 1.1 0.8  MONOABS 0.5  --  0.6 0.5 0.4  EOSABS 0.0  --  0.0 0.0 0.0  BASOSABS 0.0  --  0.0 0.0 0.0    Recent Labs  Lab 04/26/2021 2357 04/02/21 0059 04/02/21 1035 04/03/21 0708 04/03/21 1042 04/04/21 0034 04/04/21 1645 04/05/21 0205 04/27/2021 0334  NA 139  --  138  --   --  140  --  141 145  K 2.9*  --  4.3  --   --  2.8* 3.3* 3.7 4.7  CL 98  --  98  --   --  102  --  104 100  CO2 29  --  28  --   --  27  --  28 34*  GLUCOSE 107*  --  73  --   --  150*   --  254* 250*  BUN 39*  --  36*  --   --  29*  --  32* 33*  CREATININE 1.47*  --  1.22*  --   --  0.87  --  0.83 0.94  CALCIUM 8.6*  --  8.6*  --   --  8.4*  --  8.7* 9.4  AST  --   --   --   --  62* 52*  --  52* 44*  ALT  --   --   --   --  48* 37  --  44 47*  ALKPHOS  --   --   --   --  321* 276*  --  257* 227*  BILITOT  --   --   --   --  2.1* 1.5*  --  1.2 2.1*  ALBUMIN  --   --   --   --  3.2* 2.4*  --  2.4* 2.6*  MG  --  1.7  --   --   --  1.6*  --  2.2 2.2  INR  --   --   --   --  1.0  --   --   --   --   HGBA1C  --   --   --   --  5.8*  --   --   --   --   BNP 191.1*  --   --  364.3*  --  430.2*  --  586.2* 366.6*    ------------------------------------------------------------------------------------------------------------------ No results for input(s): CHOL, HDL, LDLCALC, TRIG, CHOLHDL, LDLDIRECT in the last 72 hours.  Lab Results  Component Value Date   HGBA1C 5.8 (H) 04/03/2021   ------------------------------------------------------------------------------------------------------------------ No results for input(s): TSH, T4TOTAL, T3FREE, THYROIDAB in the last 72 hours.  Invalid input(s): FREET3  Cardiac Enzymes No results for input(s): CKMB, TROPONINI, MYOGLOBIN in the last 168 hours.  Invalid input(s): CK ------------------------------------------------------------------------------------------------------------------    Component Value Date/Time   BNP 366.6 (H) 04/14/2021 0334     Radiology Reports DG Abd 1 View  Result Date: April 14, 2021 CLINICAL DATA:  80 year old female with history of abdominal distension. Emesis. EXAM: ABDOMEN - 1 VIEW COMPARISON:  No priors. FINDINGS: Multiple dilated loops of small bowel in the central abdomen measuring up to 3.7 cm in diameter. No colonic or rectal gas identified. Moderate gaseous distension in the stomach. No frank pneumoperitoneum noted on this single supine view. IMPRESSION: 1. Bowel-gas pattern is concerning for  potential small bowel obstruction, as above. Electronically Signed   By: Vinnie Langton M.D.   On: 04/14/21 09:27   CT HEAD WO CONTRAST (5MM)  Result Date: 04/02/2021 CLINICAL DATA:  Fall EXAM: CT HEAD WITHOUT CONTRAST CT CERVICAL SPINE WITHOUT CONTRAST TECHNIQUE: Multidetector CT imaging of the head and cervical spine was performed following the standard protocol without intravenous contrast. Multiplanar CT image reconstructions of the cervical spine were also generated. COMPARISON:  None. FINDINGS: CT HEAD FINDINGS Brain: There is no mass, hemorrhage or extra-axial collection. There is generalized atrophy without lobar predilection. There is hypoattenuation of the periventricular white matter, most commonly indicating chronic ischemic microangiopathy. Vascular: No abnormal hyperdensity of the major intracranial arteries or dural venous sinuses. No intracranial atherosclerosis. Skull: The visualized skull base, calvarium and extracranial soft tissues are normal. Sinuses/Orbits: No fluid levels or advanced mucosal thickening of the visualized paranasal sinuses. Left mastoid opacification. The orbits are normal. CT CERVICAL SPINE FINDINGS Alignment: No static subluxation. Facets are aligned. Occipital condyles are normally positioned. Skull base and vertebrae: No acute fracture. Soft tissues and spinal canal: No prevertebral fluid or swelling. No visible canal hematoma. Disc levels: No advanced spinal canal or neural foraminal stenosis. Upper chest: No pneumothorax, pulmonary nodule or pleural effusion. Other: Normal visualized paraspinal cervical soft tissues. IMPRESSION: 1. Chronic ischemic microangiopathy and generalized atrophy without acute intracranial abnormality. 2. No acute fracture or static subluxation of the cervical spine. Electronically Signed   By: Cletus Gash.D.  On: 04/02/2021 01:40   CT Cervical Spine Wo Contrast  Result Date: 04/02/2021 CLINICAL DATA:  Fall EXAM: CT HEAD WITHOUT  CONTRAST CT CERVICAL SPINE WITHOUT CONTRAST TECHNIQUE: Multidetector CT imaging of the head and cervical spine was performed following the standard protocol without intravenous contrast. Multiplanar CT image reconstructions of the cervical spine were also generated. COMPARISON:  None. FINDINGS: CT HEAD FINDINGS Brain: There is no mass, hemorrhage or extra-axial collection. There is generalized atrophy without lobar predilection. There is hypoattenuation of the periventricular white matter, most commonly indicating chronic ischemic microangiopathy. Vascular: No abnormal hyperdensity of the major intracranial arteries or dural venous sinuses. No intracranial atherosclerosis. Skull: The visualized skull base, calvarium and extracranial soft tissues are normal. Sinuses/Orbits: No fluid levels or advanced mucosal thickening of the visualized paranasal sinuses. Left mastoid opacification. The orbits are normal. CT CERVICAL SPINE FINDINGS Alignment: No static subluxation. Facets are aligned. Occipital condyles are normally positioned. Skull base and vertebrae: No acute fracture. Soft tissues and spinal canal: No prevertebral fluid or swelling. No visible canal hematoma. Disc levels: No advanced spinal canal or neural foraminal stenosis. Upper chest: No pneumothorax, pulmonary nodule or pleural effusion. Other: Normal visualized paraspinal cervical soft tissues. IMPRESSION: 1. Chronic ischemic microangiopathy and generalized atrophy without acute intracranial abnormality. 2. No acute fracture or static subluxation of the cervical spine. Electronically Signed   By: Ulyses Jarred M.D.   On: 04/02/2021 01:40   US Abdomen Complete  Result Date: 04/03/2021 CLINICAL DATA:  Ascites and weight loss EXAM: ABDOMEN ULTRASOUND COMPLETE COMPARISON:  None. FINDINGS: Gallbladder: Surgically absent Common bile duct: Diameter: 6 mm Liver: Heterogeneous hepatic echotexture with areas of increased echogenicity throughout the parenchyma but  no well-defined mass. Nodular hepatic contour consistent with cirrhosis. Portal vein is patent on color Doppler imaging with normal direction of blood flow towards the liver. IVC: No abnormality visualized. Pancreas: Not seen due to bowel gas Spleen: Size and appearance within normal limits. Right Kidney: Length: 10.4 cm. Echogenicity within normal limits. No mass or hydronephrosis visualized. Left Kidney: Length: 9.1 cm. Echogenicity within normal limits. No mass or hydronephrosis visualized. Abdominal aorta: No aneurysm visualized. Other findings: Moderate ascites in the abdomen. IMPRESSION: 1. Liver cirrhosis. Heterogeneous hepatic echotexture with areas of increased echogenicity but without well-defined mass by sonography. 2. Moderate ascites 3. Status post cholecystectomy Electronically Signed   By: Donavan Foil M.D.   On: 04/03/2021 15:40   US Paracentesis  Result Date: 04/18/21 INDICATION: Abdominal distension, large volume ascites seen in previous CT scan. Request for therapeutic and diagnostic paracentesis up to 2 L. EXAM: ULTRASOUND GUIDED  PARACENTESIS MEDICATIONS: 1% lidocaine 5 mL COMPLICATIONS: None immediate. PROCEDURE: Informed written consent was obtained from the patient after a discussion of the risks, benefits and alternatives to treatment. A timeout was performed prior to the initiation of the procedure. Initial ultrasound scanning demonstrates a large amount of ascites within the right lower abdominal quadrant. The right lower abdomen was prepped and draped in the usual sterile fashion. 1% lidocaine was used for local anesthesia. Following this, a 19 gauge, 7-cm, Yueh catheter was introduced. An ultrasound image was saved for documentation purposes. The paracentesis was performed. The catheter was removed and a dressing was applied. The patient tolerated the procedure well without immediate post procedural complication. FINDINGS: A total of approximately 2 L of clear yellow fluid was  removed. Samples were sent to the laboratory as requested by the clinical team. IMPRESSION: Successful ultrasound-guided paracentesis yielding 2 liters of peritoneal fluid.  Read by: Durenda Guthrie, PA-C Electronically Signed   By: Ruthann Cancer MD   On: 04-28-21 10:08   DG Chest Port 1 View  Result Date: 04/02/2021 CLINICAL DATA:  Edema. EXAM: PORTABLE CHEST 1 VIEW COMPARISON:  09/28/2018 FINDINGS: Low lung volumes. The heart is normal in size. Aortic atherosclerosis. Probable mitral annulus calcifications. There is no pulmonary edema. No focal airspace disease. No pleural fluid or pneumothorax. No acute osseous abnormalities are seen. IMPRESSION: Low lung volumes without acute abnormality. Electronically Signed   By: Keith Rake M.D.   On: 04/02/2021 00:24   ECHOCARDIOGRAM COMPLETE  Result Date: 04/04/2021    ECHOCARDIOGRAM REPORT   Patient Name:   Tracy Hammond Date of Exam: 04/04/2021 Medical Rec #:  [id number]      Height:       61.0 in Accession #:    [id number]     Weight:       128.0 lb Date of Birth:  28-Dec-1940      BSA:          1.562 m Patient Age:    89 years       BP:           122/88 mmHg Patient Gender: F              HR:           76 bpm. Exam Location:  Inpatient Procedure: 2D Echo, Color Doppler and Cardiac Doppler Indications:    CHF  History:        Patient has prior history of Echocardiogram examinations, most                 recent 04/07/2018. Risk Factors:Diabetes, Dyslipidemia and                 Hypertension.  Sonographer:    Bernadene Person RDCS Referring Phys: Arnell Asal Margaree Mackintosh [place]  1. Left ventricular ejection fraction, by estimation, is 70 to 75%. The left ventricle has hyperdynamic function. The left ventricle has no regional wall motion abnormalities. Left ventricular diastolic parameters were normal.  2. Right ventricular systolic function is moderately reduced. The right ventricular size is moderately enlarged. There is normal pulmonary artery systolic pressure.  3.  The mitral valve is grossly normal. Trivial mitral valve regurgitation.  4. The aortic valve is tricuspid. There is mild calcification of the aortic valve. Aortic valve regurgitation is not visualized. FINDINGS  Left Ventricle: Left ventricular ejection fraction, by estimation, is 70 to 75%. The left ventricle has hyperdynamic function. The left ventricle has no regional wall motion abnormalities. The left ventricular internal cavity size was normal in size. There is no left ventricular hypertrophy. Left ventricular diastolic parameters were normal. Right Ventricle: The right ventricular size is moderately enlarged. Right vetricular wall thickness was not well visualized. Right ventricular systolic function is moderately reduced. There is normal pulmonary artery systolic pressure. The tricuspid regurgitant velocity is 2.44 m/s, and with an assumed right atrial pressure of 3 mmHg, the estimated right ventricular systolic pressure is 0000000 mmHg. Left Atrium: Left atrial size was normal in size. Right Atrium: Right atrial size was normal in size. Pericardium: There is no evidence of pericardial effusion. Mitral Valve: The mitral valve is grossly normal. Mild to moderate mitral annular calcification. Trivial mitral valve regurgitation. Tricuspid Valve: The tricuspid valve is grossly normal. Tricuspid valve regurgitation is trivial. Aortic Valve: The aortic valve is tricuspid. There is mild calcification of the aortic valve. Aortic valve  regurgitation is not visualized. Pulmonic Valve: The pulmonic valve was not well visualized. Pulmonic valve regurgitation is not visualized. Aorta: The aortic root and ascending aorta are structurally normal, with no evidence of dilitation. IAS/Shunts: The atrial septum is grossly normal.  LEFT VENTRICLE PLAX 2D LVIDd:         2.60 cm  Diastology LVIDs:         1.70 cm  LV e' medial:    7.28 cm/s LV PW:         1.00 cm  LV E/e' medial:  10.8 LV IVS:        1.00 cm  LV e' lateral:   8.63  cm/s LVOT diam:     1.70 cm  LV E/e' lateral: 9.1 LV SV:         45 LV SV Index:   29 LVOT Area:     2.27 cm  RIGHT VENTRICLE RV S prime:     11.10 cm/s TAPSE (M-mode): 1.3 cm LEFT ATRIUM             Index       RIGHT ATRIUM           Index LA diam:        3.50 cm 2.24 cm/m  RA Area:     11.50 cm LA Vol (A2C):   35.1 ml 22.46 ml/m RA Volume:   22.00 ml  14.08 ml/m LA Vol (A4C):   30.1 ml 19.23 ml/m LA Biplane Vol: 35.6 ml 22.78 ml/m  AORTIC VALVE LVOT Vmax:   108.00 cm/s LVOT Vmean:  68.600 cm/s LVOT VTI:    0.199 m  AORTA Ao Root diam: 2.90 cm Ao Asc diam:  3.10 cm MITRAL VALVE                TRICUSPID VALVE MV Area (PHT): 1.76 cm     TR Peak grad:   23.8 mmHg MV Decel Time: 430 msec     TR Vmax:        244.00 cm/s MV E velocity: 78.30 cm/s MV A velocity: 130.00 cm/s  SHUNTS MV E/A ratio:  0.60         Systemic VTI:  0.20 m                             Systemic Diam: 1.70 cm Mertie Moores MD Electronically signed by Mertie Moores MD Signature Date/Time: 04/04/2021/12:27:54 PM    Final    CT CHEST ABDOMEN PELVIS WO CONTRAST  Result Date: 04/05/2021 CLINICAL DATA:  Pneumonia.  Severe peripheral edema. EXAM: CT CHEST, ABDOMEN AND PELVIS WITHOUT CONTRAST TECHNIQUE: Multidetector CT imaging of the chest, abdomen and pelvis was performed following the standard protocol without IV contrast. COMPARISON:  10/25/2004 FINDINGS: CT CHEST FINDINGS Cardiovascular: Prominent mitral annular calcifications. Diffuse coronary artery calcifications. Normal caliber of the thoracic aorta. Atherosclerotic calcifications involving the aortic arch and descending thoracic aorta. Mediastinum/Nodes: Calcified right thyroid nodule. Concern for subcarinal lymphadenopathy on sequence 3, image 18 measuring up to 2.1 cm. This represents a significant change since 2006. Overall, limited evaluation for lymphadenopathy due to the lack of intravascular contrast. No evidence for axillary lymphadenopathy. No significant esophageal dilatation.  Lungs/Pleura: There are 2 foci of airspace disease or ground-glass opacities in the right upper lung. At least 1 small focus of ground-glass opacity in the medial left upper lung on sequence 4 image 15. Few densities in left lower lobe are suggestive  for atelectasis. Central consolidation in the right lower lobe on sequence 4, image 58. Additional branching and patchy opacities in the periphery of the right lower lobe. Trace right pleural fluid. Musculoskeletal: No suspicious osseous lesions in the chest. CT ABDOMEN PELVIS FINDINGS Hepatobiliary: Perihepatic ascites. Significant artifact on this examination but there is still concern for several hypodense hepatic lesions. Possible lesion in the right hepatic lobe on sequence 3, image 50 measuring roughly 2.9 cm. Question a lesion at the posterior right hepatic dome on sequence 3, image 32. Liver may have a slightly nodular contour. Gallbladder is not confidently identified on this examination. Pancreas: Unremarkable. No pancreatic ductal dilatation or surrounding inflammatory changes. Spleen: Normal in size without focal abnormality. Adrenals/Urinary Tract: Left adrenal fullness is similar to the exam in 2006. Normal appearance of the right adrenal gland. Negative for hydronephrosis. Small calcification in the central aspect of the left kidney could be vascular versus a nonobstructive stone. Fluid in the urinary bladder. There is a ventral hernia in the lower pelvis in the suprapubic region. The bladder is herniating through this ventral hernia. Stomach/Bowel: Diverticula in sigmoid colon. Focal bowel wall thickening in the sigmoid colon on sequence 2 image 88. Mildly dilated loops of small bowel. Normal appearance of the stomach. Vascular/Lymphatic: Aorta and iliac arteries are heavily calcified. Negative for an aortic aneurysm. Difficult to exclude a prominent lymph nodes in the gastrohepatic ligament. Few prominent lymph nodes in the aortocaval region on sequence  3, image 57 and 59 that measure up to 1.1 cm. There is no significant inguinal lymphadenopathy. Reproductive: Calcifications associated the uterus. No evidence for a large adnexal mass. Other: Large volume of ascites in the abdomen and pelvis. Musculoskeletal: Diffuse subcutaneous edema. Disc space narrowing at L4-L5 and L5-S1. Left-sided pars defect at L4 without anterolisthesis of L4-L5. Degenerative facet disease in lower lumbar spine. No suspicious osseous lesion. IMPRESSION: 1. Airspace disease and consolidation in the right lower lobe with trace right pleural fluid. There are additional foci of ground-glass opacity or airspace disease in the upper lungs. Findings are suggestive for pneumonia. However, there is also concern for subcarinal lymphadenopathy. Underlying neoplastic process cannot be excluded. Recommend short-term follow-up with intravascular contrast. 2. Concern for scattered hypodense liver lesions and neoplastic disease. Evaluation of the liver is limited on this examination. Recommend further characterization with contrast enhanced CT or MRI. Alternatively, right upper quadrant ultrasound could be performed to evaluate the liver. 3. Large volume of ascites in the abdomen and pelvis. 4. Dilated loops of small bowel that may be related to the large volume ascites. 5. Tiny left renal calcification. This could be vascular versus small nonobstructive stone. 6. Ventral hernia in the lower pelvis containing portion of the urinary bladder. 7. Colonic diverticulosis. Focal wall thickening in the sigmoid colon is nonspecific. Findings could be associated with diverticulosis but neoplastic process cannot be excluded. These results will be called to the ordering clinician or representative by the Radiologist Assistant, and communication documented in the PACS or [employer]. Electronically Signed   By: Markus Daft M.D.   On: 04/05/2021 10:21

## 2021-05-01 NOTE — Procedures (Signed)
PROCEDURE SUMMARY:  Successful image-guided paracentesis from the right lower abdomen.  Yielded 2 liters of clear yellow fluid.  No immediate complications.  EBL = trace. Patient tolerated well.   Specimen was sent for labs.  Please see imaging section of Epic for full dictation.   Geramy Lamorte Lemmie Evens Jermiah Howton PA-C 04-19-2021 10:08 AM

## 2021-05-01 NOTE — Progress Notes (Signed)
PROGRESS NOTE:  Development worker, international aid paged for CT room 2. Upon arrival, CPR was underway. Chart was quickly reviewed and revealed patient was DNR. CPR was immediately stopped and death pronouncement was completed by ED MD. Primary MD notified.  Sanjuan Dame, MD Internal Medicine PGY-2 [phone]

## 2021-05-01 NOTE — Progress Notes (Signed)
   Palliative Medicine Inpatient Follow Up Note  Per RN, Nadine, [name] was transported to CT Scan and coded in the room. CPR was started then stopped once the code team recognized she was a DNR. Patient was pronounced dead thereafter.   PMT is available to support patients family as needed.   Chaplain was able to meet with family.  No Charge ______________________________________________________________________________________ [facility] Team Team Cell Phone: [phone] Please utilize secure chat with additional questions, if there is no response within 30 minutes please call the above phone number  Palliative Medicine Team providers are available by phone from 7am to 7pm daily and can be reached through the team cell phone.  Should this patient require assistance outside of these hours, please call the patient's attending physician.

## 2021-05-01 NOTE — Progress Notes (Signed)
Patient has had 3 episodes of vomiting large amounts of bile. Patient also has complaints of heart burn and has hypoactive bowel sounds. Abdomen rounded and distended.  Notified Dr. Clearence Ped x2 regarding emesis, agitation, and bowel sounds.

## 2021-05-01 NOTE — Progress Notes (Signed)
   04/19/21 1250  Clinical Encounter Type  Visited With Patient and family together  Visit Type Code;Death  Referral From Nurse  Consult/Referral To Chaplain  Spiritual Encounters  Spiritual Needs Emotional;Grief support  Ms. Delyla was taken to CT for test and went into cardiac arrest. Ms. Cathia came to end of life.   Family was at bedside.   This was unexpected.  Offered comfort and support.  The family Doristine Bosworth had arrived chaplain respected him and his time with the family.  Inform the family if I can support them in any way, please have the nurse page for the chaplain.    Chaplain Sherrell Farish Morgan-Simpson [phone]

## 2021-05-01 NOTE — Death Summary Note (Signed)
[facility] Death Note                                                                                                                                                                                               Tracy Hammond, is a 80 y.o. female, DOB - [birth year], [id number]  Admit date - 04/12/2021   Admitting Physician Thurnell Lose, MD  Outpatient Primary MD for the patient is Hoyt Koch, MD  LOS - 1  No chief complaint on file.      Notification: Hoyt Koch, MD notified of death of Apr 25, 2021   Date and Time of Death - 2021-04-25 at 12:56 PM.  Pronounced by - code team  History of present illness:   Tracy Hammond is a 80 y.o. female with a history of - essential hypertension, type 2 diabetes, adjustment disorder, recently lost her husband in February 22 in the hospital who presented from home after a fall, her daughter thinks she fell because of the severe bilateral lower extremity edema, work-up in the ER showed AKI with massive edema and she was admitted for further treatment.  Further work-up suggested that she had underlying likely liver cirrhosis and possible liver malignancy causing portal hypertension, she was extremely frail and cachectic, after discussions with family she was changed to DNR and now comfort measures on April 25, 2021.   Final Diagnoses:  Cause if death - suspected liver malignancy  Signature  Lala Lund M.D on 04/25/21 at 1:31 PM  [facility]  Office Phone [phone]  Total clinical and documentation time for today Under 30 minutes   Last Note                                                                     PROGRESS NOTE  Patient Demographics:    Tracy Hammond, is a 80 y.o. female, DOB - May 11, 1941, [id number]  Outpatient Primary MD for the patient is Hoyt Koch, MD    LOS - 1  Admit date - 04/04/2021    CC - Edema     Brief Narrative (HPI from H&P) -  Tracy Hammond is a 80 y.o. female with medical history significant for essential hypertension, type 2 diabetes, adjustment disorder, recently lost her husband in February 22 in the hospital who presented from home after a fall, her daughter thinks she fell because of the severe bilateral lower extremity edema, work-up in the ER showed AKI with massive edema and she was admitted for further treatment.  Further work-up suggested that she had underlying likely liver cirrhosis and possible liver malignancy causing portal hypertension, she was extremely frail and cachectic, after discussions with family she was changed to DNR and now comfort measures on Apr 28, 2021.   Subjective:   Patient in bed more confused, unable to answer questions or follow commands.  Again appears severely weak and cachectic.   Assessment  & Plan :     Severe peripheral edema with AKI - she also has mild ascites, reason is not entirely clear, no previous history of heart liver or renal problems, stable UA, abdominal ultrasound suggests undiagnosed cirrhosis, albumin slightly low placed on appropriate supplementation, stable echocardiogram, has been appropriately diuresed earlier, continue compressive stockings and leg elevation along with fluid restriction.  2.  Unintentional 50 pound weight loss with peripheral edema and ascites.  Work-up so far suggestive of possible liver cirrhosis and malignancy, hepatitis panel negative, continue to diurese, discussed with daughter, DNR for now as she is extremely frail and cachectic.  Palliative care, GI following, CT scan with contrast of liver and chest pending for prognostic purposes, ascites fluid appears to have SAAG of over 1 suggesting  portal hypertension, however as patient is continuing to decline rapidly family wishes comfort measures which will be initiated, she is DNR.  Likely home hospice in 1 to 2 days if she survives.  3.  Possible small bowel obstruction.  Currently NPO.  Will monitor clinically.  CT soon as above   4. Deconditioning.  PT OT and monitor.  5.  Hypokalemia.  Replaced.    6.  Essential hypertension.   will hold blood pressure medications till she is comfort measures.  7.  Recurrent bladder outlet obstruction.  Did I&O x 2, Flomax and Foley now.    8.  GERD.  PPI.    9 DM Type II.  Sliding scale.  While she is on comfort measures.  Lab Results  Component Value Date   HGBA1C 5.8 (H) 04/03/2021   CBG (last 3)  Recent Labs    04/05/21 2121 04-28-2021 0910 28-Apr-2021 1128  GLUCAP 260* 251* 189*          Condition - Extremely Guarded  Family Communication  :  daughter in rm 04/03/21, message left [phone] on 04/04/21 at 12.12pm, updated 04/05/2021, 04/28/21  Code Status : DNR  Consults  : GI, palliative care  PUD Prophylaxis : PPI   Procedures  :     CT Head and C Spine  - non acute.  ABD [place] -  1. Liver cirrhosis. Heterogeneous hepatic echotexture with areas of increased echogenicity but without well-defined mass by sonography. 2. Moderate ascites 3. Status post cholecystectomy  TTE -  1. Left ventricular ejection fraction, by estimation, is 70 to 75%. The left ventricle  has hyperdynamic function. The left ventricle has no regional wall motion abnormalities. Left ventricular diastolic parameters were normal.  2. Right ventricular systolic function is moderately reduced. The right ventricular size is moderately enlarged. There is normal pulmonary artery systolic pressure.  3. The mitral valve is grossly normal. Trivial mitral valve regurgitation.  4. The aortic valve is tricuspid. There is mild calcification of the aortic valve. Aortic valve regurgitation is not  visualized.  Ultrasound-guided paracentesis with 2 L of peritoneal fluid removed   CT chest abdomen pelvis without contrast. -   CT right upper quadrant liver with contrast -       Disposition Plan  :    Status is: Inpatient  Remains inpatient appropriate because:IV treatments appropriate due to intensity of illness or inability to take PO  Dispo: The patient is from: Home              Anticipated d/c is to: Home              Patient currently is not medically stable to d/c.   Difficult to place patient No  DVT Prophylaxis  :    enoxaparin (LOVENOX) injection 30 mg Start: 04/02/21 1000 SCDs Start: 04/02/21 0549   Lab Results  Component Value Date   PLT 223 04-27-21    Diet :  Diet Order             Diet NPO time specified Except for: Sips with Meds  Diet effective now                    Inpatient Medications  Scheduled Meds:  (feeding supplement) PROSource Plus  30 mL Oral BID BM   brimonidine  1 drop Both Eyes Q8H   Chlorhexidine Gluconate Cloth  6 each Topical Daily   dorzolamide-timolol  1 drop Both Eyes BID   enoxaparin (LOVENOX) injection  30 mg Subcutaneous Q24H   lidocaine (PF)       ondansetron       tamsulosin  0.4 mg Oral Daily   Continuous Infusions: PRN Meds:.  Antibiotics  :    Anti-infectives (From admission, onward)    None        Time Spent in minutes  30   Lala Lund M.D on 2021-04-27 at 1:31 PM  To page go to [email or web address]   [facility] -  Office  [phone]  See all Orders from today for further details    Objective:   Vitals:   04/05/21 2000 04/27/21 0945 04-27-2021 1000 04/27/2021 1112  BP: (!) 112/95 137/72 120/60 103/81  Pulse: 89   70  Resp:    17  Temp: (!) 97.3 F (36.3 C)   (!) 97.3 F (36.3 C)  TempSrc: Axillary   Axillary  SpO2: 99%     Weight:      Height:        Wt Readings from Last 3 Encounters:  04/10/2021 58.1 kg  02/27/21 57.6 kg  02/04/21 58.4 kg     Intake/Output  Summary (Last 24 hours) at April 27, 2021 1331 Last data filed at April 27, 2021 0500 Gross per 24 hour  Intake 14.94 ml  Output 453 ml  Net -438.06 ml     Physical Exam  She today is confused, appears to be in mild distress, extremely frail and cachectic [place].AT,PERRAL Supple Neck,No JVD, No cervical lymphadenopathy appriciated.  Symmetrical Chest wall movement, Good air movement bilaterally, CTAB RRR,No Gallops, Rubs or new Murmurs, No Parasternal Heave +ve B.Sounds,  abdomen is mildly distended,. No Cyanosis, Clubbing or edema, No new Rash or bruise     Pressure Injury 04/05/21 Buttocks Other (Comment);Medial pink/red (Active)  04/05/21 0300  Location: Buttocks  Location Orientation: Other (Comment);Medial (inner)  Staging:   Wound Description (Comments): pink/red  Present on Admission:      Data Review:    CBC Recent Labs  Lab 04/17/2021 2357 04/02/21 1035 04/04/21 0034 04/05/21 0205 2021-04-28 0334  WBC 10.9* 15.7* 10.2 8.7 7.3  HGB 15.0 16.2* 12.5 12.8 14.7  HCT 44.6 49.1* 37.9 38.5 44.3  PLT 150 204 181 144* 223  MCV 92.0 91.9 90.7 91.7 93.5  MCH 30.9 30.3 29.9 30.5 31.0  MCHC 33.6 33.0 33.0 33.2 33.2  RDW 15.2 15.4 15.3 15.6* 15.8*  LYMPHSABS 1.4  --  1.1 1.1 0.8  MONOABS 0.5  --  0.6 0.5 0.4  EOSABS 0.0  --  0.0 0.0 0.0  BASOSABS 0.0  --  0.0 0.0 0.0    Recent Labs  Lab 04/14/2021 2357 04/02/21 0059 04/02/21 1035 04/03/21 0708 04/03/21 1042 04/04/21 0034 04/04/21 1645 04/05/21 0205 04/28/21 0334  NA 139  --  138  --   --  140  --  141 145  K 2.9*  --  4.3  --   --  2.8* 3.3* 3.7 4.7  CL 98  --  98  --   --  102  --  104 100  CO2 29  --  28  --   --  27  --  28 34*  GLUCOSE 107*  --  73  --   --  150*  --  254* 250*  BUN 39*  --  36*  --   --  29*  --  32* 33*  CREATININE 1.47*  --  1.22*  --   --  0.87  --  0.83 0.94  CALCIUM 8.6*  --  8.6*  --   --  8.4*  --  8.7* 9.4  AST  --   --   --   --  62* 52*  --  52* 44*  ALT  --   --   --   --  48* 37  --  44  47*  ALKPHOS  --   --   --   --  321* 276*  --  257* 227*  BILITOT  --   --   --   --  2.1* 1.5*  --  1.2 2.1*  ALBUMIN  --   --   --   --  3.2* 2.4*  --  2.4* 2.6*  MG  --  1.7  --   --   --  1.6*  --  2.2 2.2  INR  --   --   --   --  1.0  --   --   --   --   HGBA1C  --   --   --   --  5.8*  --   --   --   --   BNP 191.1*  --   --  364.3*  --  430.2*  --  586.2* 366.6*    ------------------------------------------------------------------------------------------------------------------ No results for input(s): CHOL, HDL, LDLCALC, TRIG, CHOLHDL, LDLDIRECT in the last 72 hours.  Lab Results  Component Value Date   HGBA1C 5.8 (H) 04/03/2021   ------------------------------------------------------------------------------------------------------------------ No results for input(s): TSH, T4TOTAL, T3FREE, THYROIDAB in the last 72 hours.  Invalid input(s): FREET3  Cardiac Enzymes No results for  input(s): CKMB, TROPONINI, MYOGLOBIN in the last 168 hours.  Invalid input(s): CK ------------------------------------------------------------------------------------------------------------------    Component Value Date/Time   BNP 366.6 (H) 05-06-21 0334     Radiology Reports DG Abd 1 View  Result Date: 05-06-2021 CLINICAL DATA:  80 year old female with history of abdominal distension. Emesis. EXAM: ABDOMEN - 1 VIEW COMPARISON:  No priors. FINDINGS: Multiple dilated loops of small bowel in the central abdomen measuring up to 3.7 cm in diameter. No colonic or rectal gas identified. Moderate gaseous distension in the stomach. No frank pneumoperitoneum noted on this single supine view. IMPRESSION: 1. Bowel-gas pattern is concerning for potential small bowel obstruction, as above. Electronically Signed   By: Vinnie Langton M.D.   On: 05/06/21 09:27   CT HEAD WO CONTRAST (5MM)  Result Date: 04/02/2021 CLINICAL DATA:  Fall EXAM: CT HEAD WITHOUT CONTRAST CT CERVICAL SPINE WITHOUT CONTRAST  TECHNIQUE: Multidetector CT imaging of the head and cervical spine was performed following the standard protocol without intravenous contrast. Multiplanar CT image reconstructions of the cervical spine were also generated. COMPARISON:  None. FINDINGS: CT HEAD FINDINGS Brain: There is no mass, hemorrhage or extra-axial collection. There is generalized atrophy without lobar predilection. There is hypoattenuation of the periventricular white matter, most commonly indicating chronic ischemic microangiopathy. Vascular: No abnormal hyperdensity of the major intracranial arteries or dural venous sinuses. No intracranial atherosclerosis. Skull: The visualized skull base, calvarium and extracranial soft tissues are normal. Sinuses/Orbits: No fluid levels or advanced mucosal thickening of the visualized paranasal sinuses. Left mastoid opacification. The orbits are normal. CT CERVICAL SPINE FINDINGS Alignment: No static subluxation. Facets are aligned. Occipital condyles are normally positioned. Skull base and vertebrae: No acute fracture. Soft tissues and spinal canal: No prevertebral fluid or swelling. No visible canal hematoma. Disc levels: No advanced spinal canal or neural foraminal stenosis. Upper chest: No pneumothorax, pulmonary nodule or pleural effusion. Other: Normal visualized paraspinal cervical soft tissues. IMPRESSION: 1. Chronic ischemic microangiopathy and generalized atrophy without acute intracranial abnormality. 2. No acute fracture or static subluxation of the cervical spine. Electronically Signed   By: Ulyses Jarred M.D.   On: 04/02/2021 01:40   CT Cervical Spine Wo Contrast  Result Date: 04/02/2021 CLINICAL DATA:  Fall EXAM: CT HEAD WITHOUT CONTRAST CT CERVICAL SPINE WITHOUT CONTRAST TECHNIQUE: Multidetector CT imaging of the head and cervical spine was performed following the standard protocol without intravenous contrast. Multiplanar CT image reconstructions of the cervical spine were also  generated. COMPARISON:  None. FINDINGS: CT HEAD FINDINGS Brain: There is no mass, hemorrhage or extra-axial collection. There is generalized atrophy without lobar predilection. There is hypoattenuation of the periventricular white matter, most commonly indicating chronic ischemic microangiopathy. Vascular: No abnormal hyperdensity of the major intracranial arteries or dural venous sinuses. No intracranial atherosclerosis. Skull: The visualized skull base, calvarium and extracranial soft tissues are normal. Sinuses/Orbits: No fluid levels or advanced mucosal thickening of the visualized paranasal sinuses. Left mastoid opacification. The orbits are normal. CT CERVICAL SPINE FINDINGS Alignment: No static subluxation. Facets are aligned. Occipital condyles are normally positioned. Skull base and vertebrae: No acute fracture. Soft tissues and spinal canal: No prevertebral fluid or swelling. No visible canal hematoma. Disc levels: No advanced spinal canal or neural foraminal stenosis. Upper chest: No pneumothorax, pulmonary nodule or pleural effusion. Other: Normal visualized paraspinal cervical soft tissues. IMPRESSION: 1. Chronic ischemic microangiopathy and generalized atrophy without acute intracranial abnormality. 2. No acute fracture or static subluxation of the cervical spine. Electronically Signed   By:  Ulyses Jarred M.D.   On: 04/02/2021 01:40   US Abdomen Complete  Result Date: 04/03/2021 CLINICAL DATA:  Ascites and weight loss EXAM: ABDOMEN ULTRASOUND COMPLETE COMPARISON:  None. FINDINGS: Gallbladder: Surgically absent Common bile duct: Diameter: 6 mm Liver: Heterogeneous hepatic echotexture with areas of increased echogenicity throughout the parenchyma but no well-defined mass. Nodular hepatic contour consistent with cirrhosis. Portal vein is patent on color Doppler imaging with normal direction of blood flow towards the liver. IVC: No abnormality visualized. Pancreas: Not seen due to bowel gas Spleen: Size  and appearance within normal limits. Right Kidney: Length: 10.4 cm. Echogenicity within normal limits. No mass or hydronephrosis visualized. Left Kidney: Length: 9.1 cm. Echogenicity within normal limits. No mass or hydronephrosis visualized. Abdominal aorta: No aneurysm visualized. Other findings: Moderate ascites in the abdomen. IMPRESSION: 1. Liver cirrhosis. Heterogeneous hepatic echotexture with areas of increased echogenicity but without well-defined mass by sonography. 2. Moderate ascites 3. Status post cholecystectomy Electronically Signed   By: Donavan Foil M.D.   On: 04/03/2021 15:40   US Paracentesis  Result Date: 05/01/21 INDICATION: Abdominal distension, large volume ascites seen in previous CT scan. Request for therapeutic and diagnostic paracentesis up to 2 L. EXAM: ULTRASOUND GUIDED  PARACENTESIS MEDICATIONS: 1% lidocaine 5 mL COMPLICATIONS: None immediate. PROCEDURE: Informed written consent was obtained from the patient after a discussion of the risks, benefits and alternatives to treatment. A timeout was performed prior to the initiation of the procedure. Initial ultrasound scanning demonstrates a large amount of ascites within the right lower abdominal quadrant. The right lower abdomen was prepped and draped in the usual sterile fashion. 1% lidocaine was used for local anesthesia. Following this, a 19 gauge, 7-cm, Yueh catheter was introduced. An ultrasound image was saved for documentation purposes. The paracentesis was performed. The catheter was removed and a dressing was applied. The patient tolerated the procedure well without immediate post procedural complication. FINDINGS: A total of approximately 2 L of clear yellow fluid was removed. Samples were sent to the laboratory as requested by the clinical team. IMPRESSION: Successful ultrasound-guided paracentesis yielding 2 liters of peritoneal fluid. Read by: Durenda Guthrie, PA-C Electronically Signed   By: Ruthann Cancer MD   On: May 01, 2021  10:08   DG Chest Port 1 View  Result Date: 04/02/2021 CLINICAL DATA:  Edema. EXAM: PORTABLE CHEST 1 VIEW COMPARISON:  09/28/2018 FINDINGS: Low lung volumes. The heart is normal in size. Aortic atherosclerosis. Probable mitral annulus calcifications. There is no pulmonary edema. No focal airspace disease. No pleural fluid or pneumothorax. No acute osseous abnormalities are seen. IMPRESSION: Low lung volumes without acute abnormality. Electronically Signed   By: Keith Rake M.D.   On: 04/02/2021 00:24   ECHOCARDIOGRAM COMPLETE  Result Date: 04/04/2021    ECHOCARDIOGRAM REPORT   Patient Name:   JALENE LAWRIE Date of Exam: 04/04/2021 Medical Rec #:  [id number]      Height:       61.0 in Accession #:    [id number]     Weight:       128.0 lb Date of Birth:  07-14-1941      BSA:          1.562 m Patient Age:    3 years       BP:           122/88 mmHg Patient Gender: F              HR:  76 bpm. Exam Location:  Inpatient Procedure: 2D Echo, Color Doppler and Cardiac Doppler Indications:    CHF  History:        Patient has prior history of Echocardiogram examinations, most                 recent 04/07/2018. Risk Factors:Diabetes, Dyslipidemia and                 Hypertension.  Sonographer:    Bernadene Person RDCS Referring Phys: Arnell Asal Margaree Mackintosh [place]  1. Left ventricular ejection fraction, by estimation, is 70 to 75%. The left ventricle has hyperdynamic function. The left ventricle has no regional wall motion abnormalities. Left ventricular diastolic parameters were normal.  2. Right ventricular systolic function is moderately reduced. The right ventricular size is moderately enlarged. There is normal pulmonary artery systolic pressure.  3. The mitral valve is grossly normal. Trivial mitral valve regurgitation.  4. The aortic valve is tricuspid. There is mild calcification of the aortic valve. Aortic valve regurgitation is not visualized. FINDINGS  Left Ventricle: Left ventricular ejection  fraction, by estimation, is 70 to 75%. The left ventricle has hyperdynamic function. The left ventricle has no regional wall motion abnormalities. The left ventricular internal cavity size was normal in size. There is no left ventricular hypertrophy. Left ventricular diastolic parameters were normal. Right Ventricle: The right ventricular size is moderately enlarged. Right vetricular wall thickness was not well visualized. Right ventricular systolic function is moderately reduced. There is normal pulmonary artery systolic pressure. The tricuspid regurgitant velocity is 2.44 m/s, and with an assumed right atrial pressure of 3 mmHg, the estimated right ventricular systolic pressure is 0000000 mmHg. Left Atrium: Left atrial size was normal in size. Right Atrium: Right atrial size was normal in size. Pericardium: There is no evidence of pericardial effusion. Mitral Valve: The mitral valve is grossly normal. Mild to moderate mitral annular calcification. Trivial mitral valve regurgitation. Tricuspid Valve: The tricuspid valve is grossly normal. Tricuspid valve regurgitation is trivial. Aortic Valve: The aortic valve is tricuspid. There is mild calcification of the aortic valve. Aortic valve regurgitation is not visualized. Pulmonic Valve: The pulmonic valve was not well visualized. Pulmonic valve regurgitation is not visualized. Aorta: The aortic root and ascending aorta are structurally normal, with no evidence of dilitation. IAS/Shunts: The atrial septum is grossly normal.  LEFT VENTRICLE PLAX 2D LVIDd:         2.60 cm  Diastology LVIDs:         1.70 cm  LV e' medial:    7.28 cm/s LV PW:         1.00 cm  LV E/e' medial:  10.8 LV IVS:        1.00 cm  LV e' lateral:   8.63 cm/s LVOT diam:     1.70 cm  LV E/e' lateral: 9.1 LV SV:         45 LV SV Index:   29 LVOT Area:     2.27 cm  RIGHT VENTRICLE RV S prime:     11.10 cm/s TAPSE (M-mode): 1.3 cm LEFT ATRIUM             Index       RIGHT ATRIUM           Index LA diam:         3.50 cm 2.24 cm/m  RA Area:     11.50 cm LA Vol (A2C):   35.1 ml 22.46 ml/m RA Volume:  22.00 ml  14.08 ml/m LA Vol (A4C):   30.1 ml 19.23 ml/m LA Biplane Vol: 35.6 ml 22.78 ml/m  AORTIC VALVE LVOT Vmax:   108.00 cm/s LVOT Vmean:  68.600 cm/s LVOT VTI:    0.199 m  AORTA Ao Root diam: 2.90 cm Ao Asc diam:  3.10 cm MITRAL VALVE                TRICUSPID VALVE MV Area (PHT): 1.76 cm     TR Peak grad:   23.8 mmHg MV Decel Time: 430 msec     TR Vmax:        244.00 cm/s MV E velocity: 78.30 cm/s MV A velocity: 130.00 cm/s  SHUNTS MV E/A ratio:  0.60         Systemic VTI:  0.20 m                             Systemic Diam: 1.70 cm Mertie Moores MD Electronically signed by Mertie Moores MD Signature Date/Time: 04/04/2021/12:27:54 PM    Final    CT CHEST ABDOMEN PELVIS WO CONTRAST  Result Date: 04/05/2021 CLINICAL DATA:  Pneumonia.  Severe peripheral edema. EXAM: CT CHEST, ABDOMEN AND PELVIS WITHOUT CONTRAST TECHNIQUE: Multidetector CT imaging of the chest, abdomen and pelvis was performed following the standard protocol without IV contrast. COMPARISON:  10/25/2004 FINDINGS: CT CHEST FINDINGS Cardiovascular: Prominent mitral annular calcifications. Diffuse coronary artery calcifications. Normal caliber of the thoracic aorta. Atherosclerotic calcifications involving the aortic arch and descending thoracic aorta. Mediastinum/Nodes: Calcified right thyroid nodule. Concern for subcarinal lymphadenopathy on sequence 3, image 18 measuring up to 2.1 cm. This represents a significant change since 2006. Overall, limited evaluation for lymphadenopathy due to the lack of intravascular contrast. No evidence for axillary lymphadenopathy. No significant esophageal dilatation. Lungs/Pleura: There are 2 foci of airspace disease or ground-glass opacities in the right upper lung. At least 1 small focus of ground-glass opacity in the medial left upper lung on sequence 4 image 15. Few densities in left lower lobe are suggestive for  atelectasis. Central consolidation in the right lower lobe on sequence 4, image 58. Additional branching and patchy opacities in the periphery of the right lower lobe. Trace right pleural fluid. Musculoskeletal: No suspicious osseous lesions in the chest. CT ABDOMEN PELVIS FINDINGS Hepatobiliary: Perihepatic ascites. Significant artifact on this examination but there is still concern for several hypodense hepatic lesions. Possible lesion in the right hepatic lobe on sequence 3, image 50 measuring roughly 2.9 cm. Question a lesion at the posterior right hepatic dome on sequence 3, image 32. Liver may have a slightly nodular contour. Gallbladder is not confidently identified on this examination. Pancreas: Unremarkable. No pancreatic ductal dilatation or surrounding inflammatory changes. Spleen: Normal in size without focal abnormality. Adrenals/Urinary Tract: Left adrenal fullness is similar to the exam in 2006. Normal appearance of the right adrenal gland. Negative for hydronephrosis. Small calcification in the central aspect of the left kidney could be vascular versus a nonobstructive stone. Fluid in the urinary bladder. There is a ventral hernia in the lower pelvis in the suprapubic region. The bladder is herniating through this ventral hernia. Stomach/Bowel: Diverticula in sigmoid colon. Focal bowel wall thickening in the sigmoid colon on sequence 2 image 88. Mildly dilated loops of small bowel. Normal appearance of the stomach. Vascular/Lymphatic: Aorta and iliac arteries are heavily calcified. Negative for an aortic aneurysm. Difficult to exclude a prominent lymph nodes in the gastrohepatic  ligament. Few prominent lymph nodes in the aortocaval region on sequence 3, image 57 and 59 that measure up to 1.1 cm. There is no significant inguinal lymphadenopathy. Reproductive: Calcifications associated the uterus. No evidence for a large adnexal mass. Other: Large volume of ascites in the abdomen and pelvis.  Musculoskeletal: Diffuse subcutaneous edema. Disc space narrowing at L4-L5 and L5-S1. Left-sided pars defect at L4 without anterolisthesis of L4-L5. Degenerative facet disease in lower lumbar spine. No suspicious osseous lesion. IMPRESSION: 1. Airspace disease and consolidation in the right lower lobe with trace right pleural fluid. There are additional foci of ground-glass opacity or airspace disease in the upper lungs. Findings are suggestive for pneumonia. However, there is also concern for subcarinal lymphadenopathy. Underlying neoplastic process cannot be excluded. Recommend short-term follow-up with intravascular contrast. 2. Concern for scattered hypodense liver lesions and neoplastic disease. Evaluation of the liver is limited on this examination. Recommend further characterization with contrast enhanced CT or MRI. Alternatively, right upper quadrant ultrasound could be performed to evaluate the liver. 3. Large volume of ascites in the abdomen and pelvis. 4. Dilated loops of small bowel that may be related to the large volume ascites. 5. Tiny left renal calcification. This could be vascular versus small nonobstructive stone. 6. Ventral hernia in the lower pelvis containing portion of the urinary bladder. 7. Colonic diverticulosis. Focal wall thickening in the sigmoid colon is nonspecific. Findings could be associated with diverticulosis but neoplastic process cannot be excluded. These results will be called to the ordering clinician or representative by the Radiologist Assistant, and communication documented in the PACS or [employer]. Electronically Signed   By: Markus Daft M.D.   On: 04/05/2021 10:21

## 2021-05-01 NOTE — Progress Notes (Signed)
We had intended to see the patient again today however I see that she has been placed on comfort measures.  We will sign off but available, please call if questions

## 2021-05-01 NOTE — Progress Notes (Signed)
Manufacturing engineer [facility]) Hospital Liaison: RN note     Notified by Transition of Care Manger of patient/family request for [facility] services at home after discharge. Chart and patient information under review by [facility] physician. Hospice eligibility pending currently.     Writer spoke briefly with pt's daughter, [name], who shares that her mother is very medically fragile at this time, asks to speak later today, depending on pt's trajectory.  This RN will go by the room to continue discussion as appropriate and to offer any comfort possible.     Please do not hesitate to call with questions.   Thank you for the opportunity to participate in this patient's care.  Domenic Moras, BSN, RN       [place] (listed on [employer] under [phone]  [phone] (24h on call)

## 2021-05-01 NOTE — TOC Progression Note (Signed)
Transition of Care [facility]) - Progression Note    Patient Details  Name: Tracy Hammond MRN: [id number] Date of Birth: November 05, 1940  Transition of Care [facility]) CM/SW Contact  Carles Collet, RN Phone Number: 04/07/21, 11:51 AM  Clinical Narrative:    Damaris Schooner w daughter [name] at bedside, patient encephalopathic. Discussed hospice at home. [name] states that the patient verbalized wanting to go home to die. She will be returning to her home address in chart, with support from her daughters to provide 24/7 care. [name] states that she able to provide support tomorrow if needed and her sister is planning on coming to town in the next 1-2 days. They would like to use [facility].  Will need hospital bed, possibly home oxygen (patient will not keep it on while here). Referral made to [facility] [place]. Will need [facility] transport    Expected Discharge Plan: Home w Hospice Care Barriers to Discharge: Continued Medical Work up  Expected Discharge Plan and Services Expected Discharge Plan: [place] Acute Care Choice: [place] arrangements for the past 2 months: [place]: Hospice and [place] Date [facility] Agency Contacted: 04/07/2021 Time HH Agency Contacted: 1100 Representative spoke with at [place]: [place] ([place]) Interventions    Readmission Risk Interventions No flowsheet data found.

## 2021-05-01 DEATH — deceased

## 2021-06-18 ENCOUNTER — Ambulatory Visit: Payer: Medicare Other | Admitting: Dermatology

## 2022-09-19 IMAGING — CT CT CERVICAL SPINE W/O CM
3 of 4 series · 12 of 33 positions shown, 14 images · non-contrast
Comparison: None.

CLINICAL DATA: Fall

EXAM:
CT HEAD WITHOUT CONTRAST
CT CERVICAL SPINE WITHOUT CONTRAST
TECHNIQUE: Multidetector CT imaging of the head and cervical spine was
performed following the standard protocol without intravenous
contrast. Multiplanar CT image reconstructions of the cervical spine
were also generated.

[Series 4: c_spine 2.0 orthogonals · axial · 0.21mm/px · z∈[-251,-145]mm · 4 of 92 slices shown, 5 images]
[im 16/92  soft-tissue]
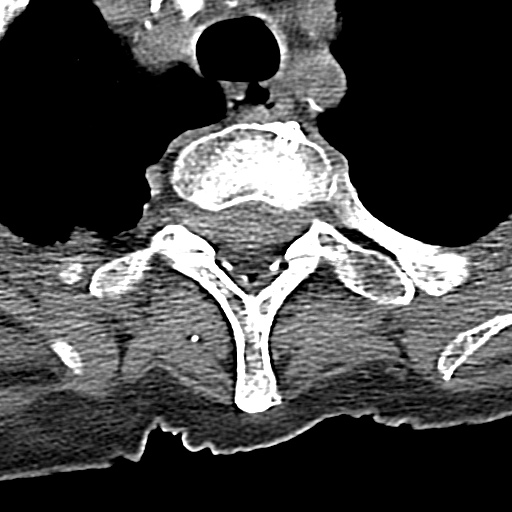
[im 16/92  bone]
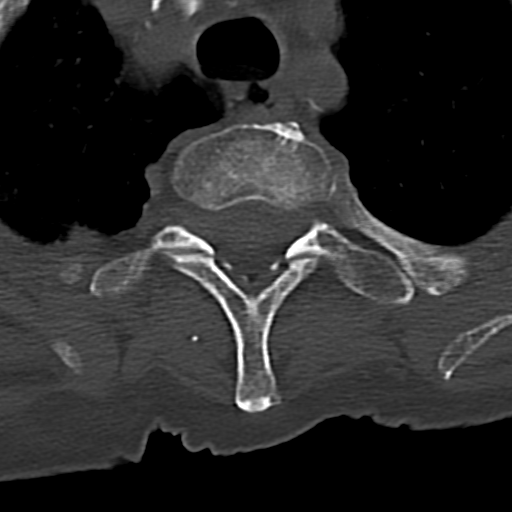
[im 31/92  bone]
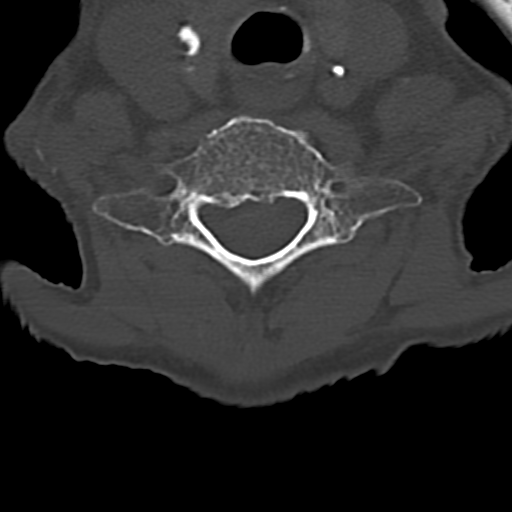
[im 61/92  bone]
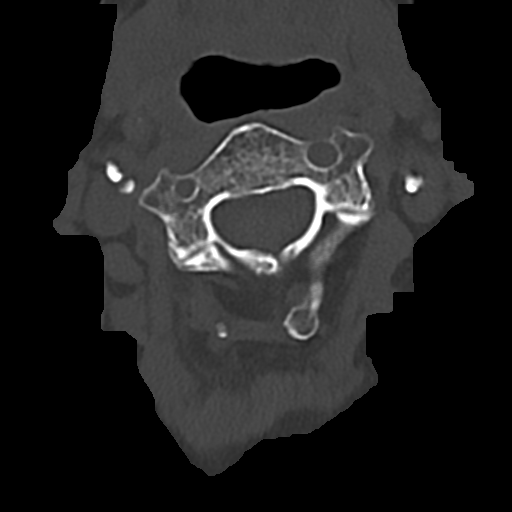
[im 76/92  bone]
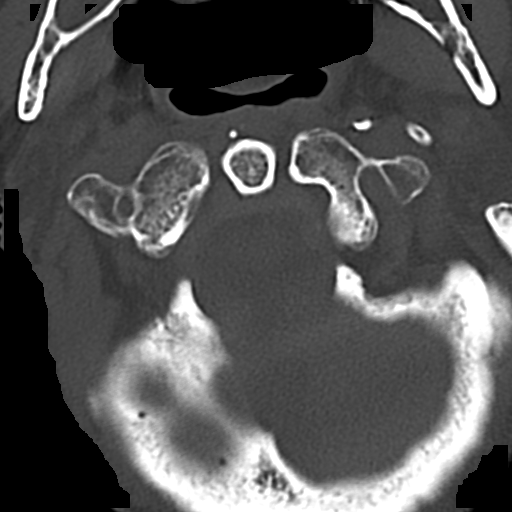

[Series 7: c_spine 2.0 sag bone · sagittal · 0.30mm/px · 5 of 61 slices shown, 6 images]
[im 21/61  bone]
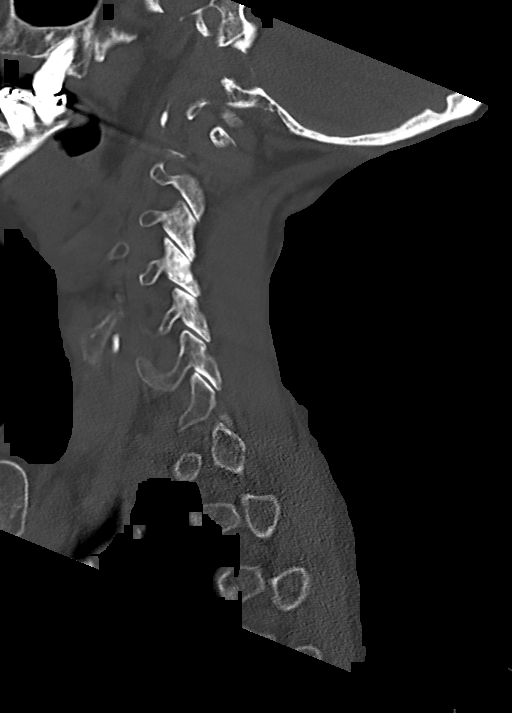
[im 26/61  bone]
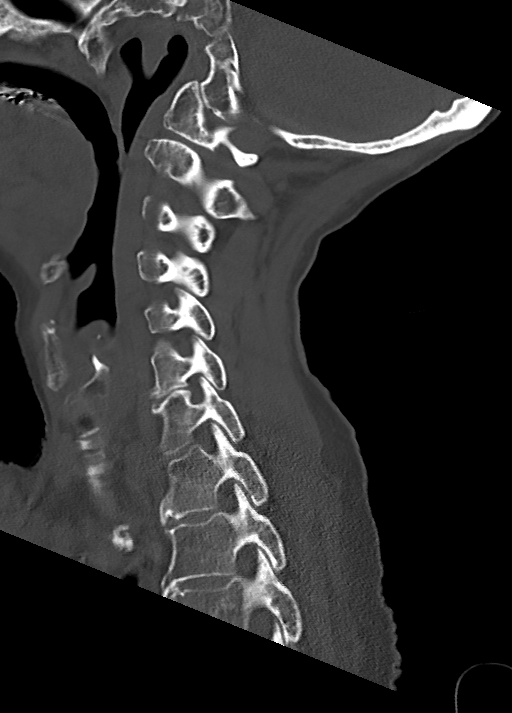
[im 31/61  soft-tissue]
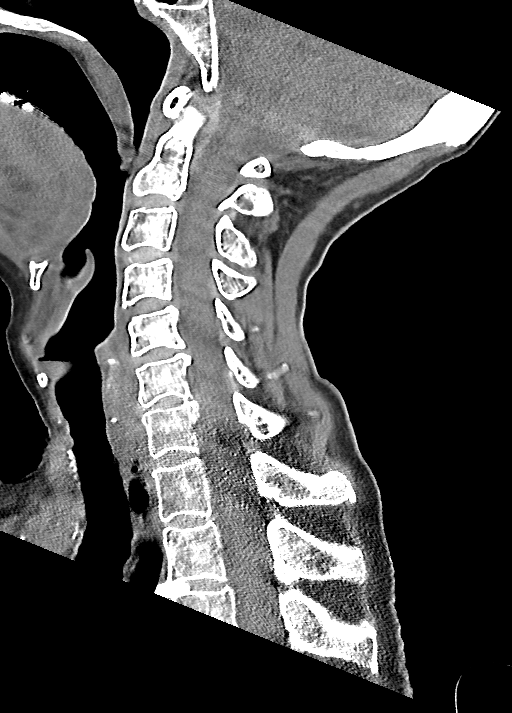
[im 31/61  bone]
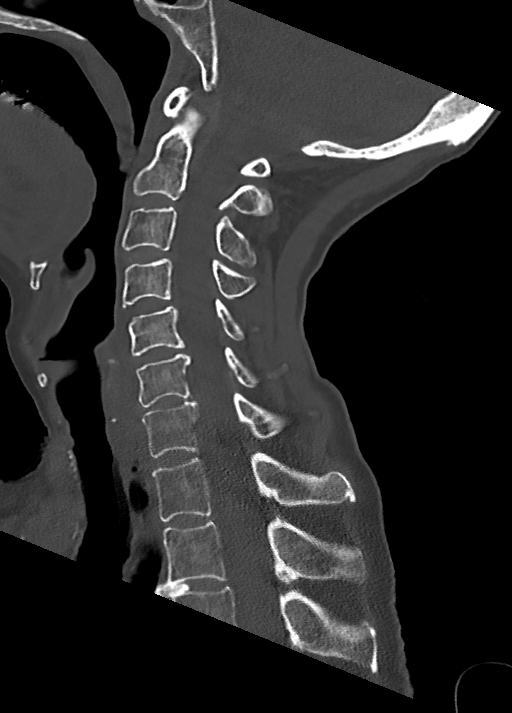
[im 36/61  bone]
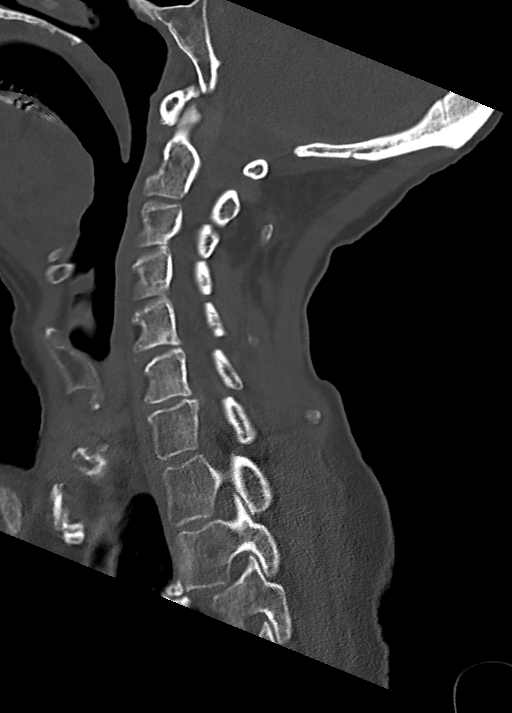
[im 41/61  bone]
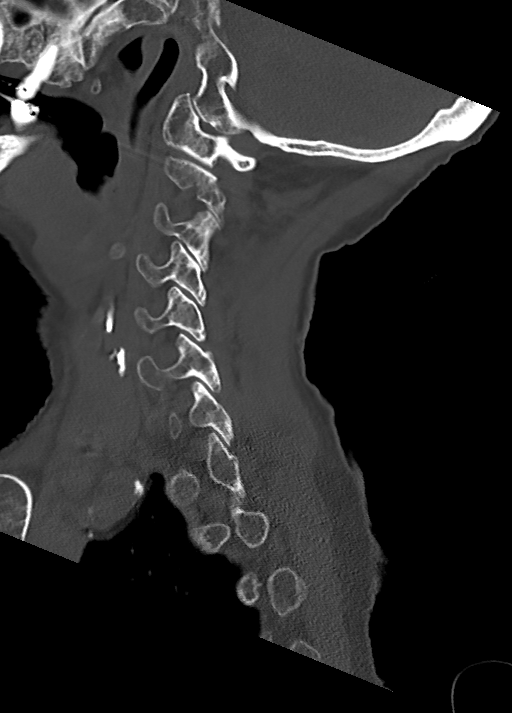

[Series 8: c_spine 2.0 cor bone · coronal · 0.26mm/px · 3 of 60 slices shown]
[im 12/60  bone]
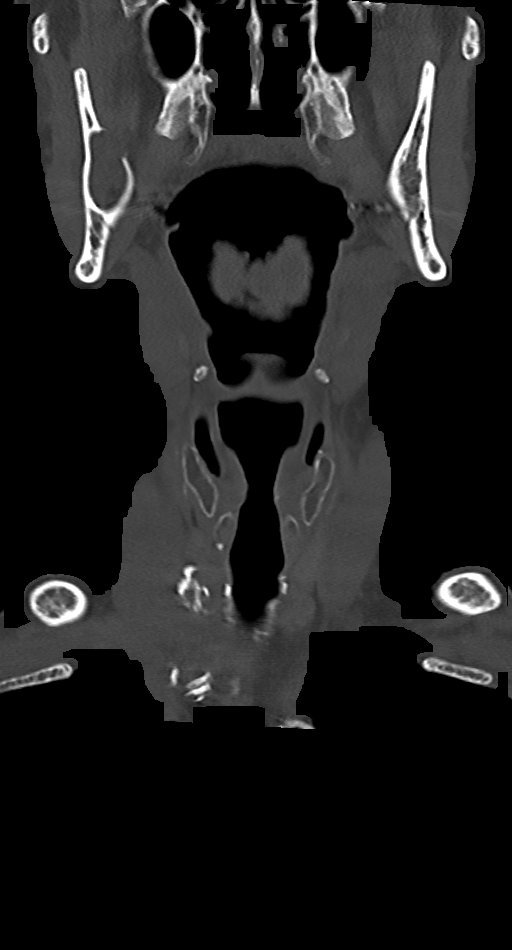
[im 24/60  bone]
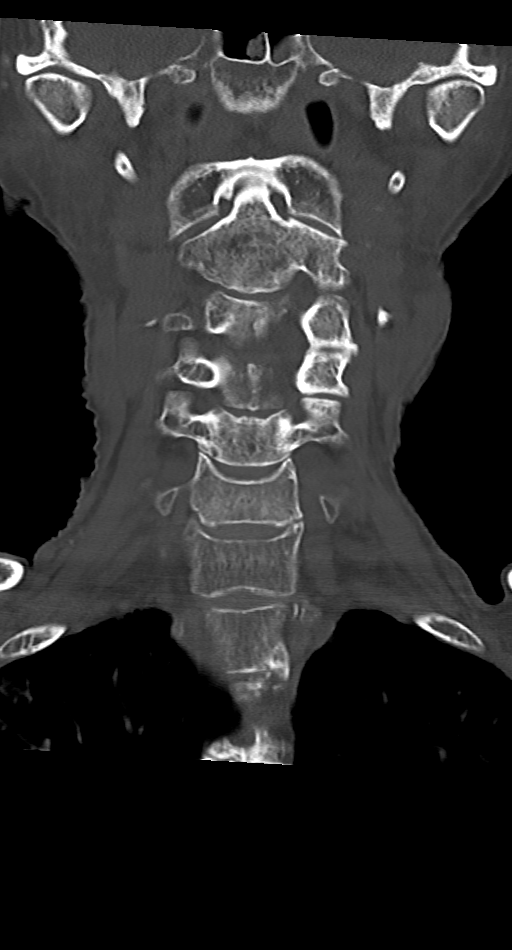
[im 36/60  bone]
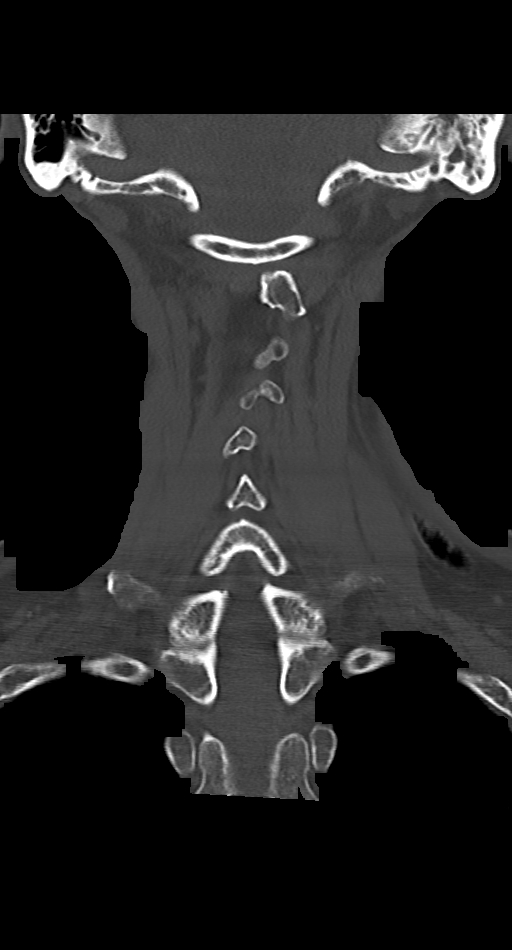

[12 of 33 positions shown; findings below may reference images not displayed]

FINDINGS: CT HEAD FINDINGS

Brain: There is no mass, hemorrhage or extra-axial collection. There
is generalized atrophy without lobar predilection. There is
hypoattenuation of the periventricular white matter, most commonly
indicating chronic ischemic microangiopathy.

Vascular: No abnormal hyperdensity of the major intracranial
arteries or dural venous sinuses. No intracranial atherosclerosis.

Skull: The visualized skull base, calvarium and extracranial soft
tissues are normal.

Sinuses/Orbits: No fluid levels or advanced mucosal thickening of
the visualized paranasal sinuses. Left mastoid opacification. The
orbits are normal.

CT CERVICAL SPINE FINDINGS

Alignment: No static subluxation. Facets are aligned. Occipital
condyles are normally positioned.

Skull base and vertebrae: No acute fracture.

Soft tissues and spinal canal: No prevertebral fluid or swelling. No
visible canal hematoma.

Disc levels: No advanced spinal canal or neural foraminal stenosis.

Upper chest: No pneumothorax, pulmonary nodule or pleural effusion.

Other: Normal visualized paraspinal cervical soft tissues.
IMPRESSION: 1. Chronic ischemic microangiopathy and generalized atrophy without
acute intracranial abnormality.
2. No acute fracture or static subluxation of the cervical spine.
